# Patient Record
Sex: Female | Born: 1985 | Race: Black or African American | Hispanic: No | Marital: Single | Smoking: Current every day smoker
Health system: Southern US, Community
[De-identification: ages and names within clinical notes are randomized; demographics above are authoritative.]

## PROBLEM LIST (undated history)

## (undated) ENCOUNTER — Inpatient Hospital Stay (HOSPITAL_COMMUNITY): Payer: Self-pay

## (undated) DIAGNOSIS — F29 Unspecified psychosis not due to a substance or known physiological condition: Secondary | ICD-10-CM

## (undated) DIAGNOSIS — F1910 Other psychoactive substance abuse, uncomplicated: Secondary | ICD-10-CM

## (undated) DIAGNOSIS — D649 Anemia, unspecified: Secondary | ICD-10-CM

## (undated) NOTE — *Deleted (*Deleted)
Behavioral Health Admission H&P [facility] & OBS)  Date: 05/08/20 Patient Name: ANAYSHA ANDRE MRN: [id number] Chief Complaint:  Chief Complaint  Patient presents with  . Manic Behavior      Diagnoses:  Final diagnoses:  None    HPI: ***  PHQ 2-9:    ED from 03/26/2020 in [facility] Office Visit from 05/07/2017 in [facility]  Thoughts that you would be better off dead, or of hurting yourself in some way Several days Not at all  PHQ-9 Total Score 19 0        [facility]) [facility] from 03/26/2020 in [facility] Admission (Discharged) from [facility]  C-SSRS RISK CATEGORY No Risk No Risk No Risk       Total Time spent with patient: {Time; 15 min - 8 hours:17441}  Musculoskeletal  Strength & Muscle Tone: {desc; muscle tone:32375} Gait & Station: {PE GAIT ED WUJW:11914} Patient leans: {Patient Leans:21022755}  Psychiatric Specialty Exam  Presentation General Appearance: Bizarre;Disheveled  Eye Contact:Fleeting  Speech:Blocked  Speech Volume:Increased  Handedness:Right   Mood and Affect  Mood:Anxious;Irritable;Angry  Affect:Inappropriate;Full Range   Thought Process  Thought Processes:Disorganized  Descriptions of Associations:Loose  Orientation:Full (Time, Place and Person)  Thought Content:Illogical;Delusions;Paranoid Ideation  Hallucinations:Hallucinations: None  Ideas of Reference:None  Suicidal Thoughts:Suicidal Thoughts: No  Homicidal Thoughts:Homicidal Thoughts: No   Sensorium  Memory:Immediate Poor;Recent Poor;Remote Poor  Judgment:Poor  Insight:Lacking   Executive Functions  Concentration:Poor  Attention Span:Poor  Recall:Poor  Fund of Knowledge:Poor  Language:Poor   Psychomotor Activity  Psychomotor Activity:Psychomotor  Activity: Restlessness   Assets  Assets:Communication Skills;Desire for Improvement;Resilience;Social Support   Sleep  Sleep:Sleep: Fair   Physical Exam ROS  Blood pressure 113/84, pulse 93, temperature 98.6 F (37 C), temperature source Oral, resp. rate 18, SpO2 100 %. There is no height or weight on file to calculate BMI.  Past Psychiatric History: ***   Is the patient at risk to self? {YES/NO:21197} Has the patient been a risk to self in the past 6 months? {YES/NO:21197}.    Has the patient been a risk to self within the distant past? {YES/NO:21197}  Is the patient a risk to others? {YES/NO:21197}  Has the patient been a risk to others in the past 6 months? {YES/NO:21197}  Has the patient been a risk to others within the distant past? {YES/NO:21197}  Past Medical History:  Past Medical History:  Diagnosis Date  . Anemia   . Drug abuse (HCC)   . Psychosis (HCC) 2013    Past Surgical History:  Procedure Laterality Date  . CESAREAN SECTION  03/2016   pLTCS for twin B at [facility]: Lambert Rhonda W  . LAPAROSCOPY N/A 04/14/2017   Procedure: LAPAROSCOPY OPERATIVE WITH RIGHT SALPINGECTOMY;  Surgeon: Conan Bowens, MD;  Location: [facility];  Service: Gynecology;  Laterality: N/A;  . TUBAL LIGATION Bilateral 01/22/2018   Procedure: POST PARTUM TUBAL LIGATION;  Surgeon: [name], DO;  Location: [facility];  Service: Gynecology;  Laterality: Bilateral;    Family History:  Family History  Problem Relation Age of Onset  . Diabetes Mother   . Schizophrenia Mother   . Diabetes Brother   . Hypertension Maternal Aunt   . Healthy Father     Social History:  Social History   Socioeconomic History  . Marital status: Single    Spouse name: Not on file  .  Number of children: Not on file  . Years of education: Not on file  . Highest education level: Not on file  Occupational History  . Not on file  Tobacco Use  . Smoking status: Current Every Day Smoker    Packs/day: 0.25     Types: Cigarettes  . Smokeless tobacco: Never Used  Vaping Use  . Vaping Use: Never used  Substance and Sexual Activity  . Alcohol use: Not Currently    Alcohol/week: 0.0 standard drinks    Comment: socially  . Drug use: Not Currently    Types: Marijuana, Cocaine    Comment: Cocaine & Marijuana was used10/26/2018  . Sexual activity: Yes    Birth control/protection: None  Other Topics Concern  . Not on file  Social History Narrative  . Not on file   Social Determinants of Health   Financial Resource Strain:   . Difficulty of Paying Living Expenses: Not on file  Food Insecurity:   . Worried About Programme researcher, broadcasting/film/video in the Last Year: Not on file  . Ran Out of Food in the Last Year: Not on file  Transportation Needs:   . Lack of Transportation (Medical): Not on file  . Lack of Transportation (Non-Medical): Not on file  Physical Activity:   . Days of Exercise per Week: Not on file  . Minutes of Exercise per Session: Not on file  Stress:   . Feeling of Stress : Not on file  Social Connections:   . Frequency of Communication with Friends and Family: Not on file  . Frequency of Social Gatherings with Friends and Family: Not on file  . Attends Religious Services: Not on file  . Active Member of Clubs or Organizations: Not on file  . Attends Banker Meetings: Not on file  . Marital Status: Not on file  Intimate Partner Violence:   . Fear of Current or Ex-Partner: Not on file  . Emotionally Abused: Not on file  . Physically Abused: Not on file  . Sexually Abused: Not on file    SDOH:  SDOH Screenings   Alcohol Screen: Low Risk   . Last Alcohol Screening Score (AUDIT): 0  Depression (PHQ2-9): Medium Risk  . PHQ-2 Score: 19  Financial Resource Strain:   . Difficulty of Paying Living Expenses: Not on file  Food Insecurity:   . Worried About Programme researcher, broadcasting/film/video in the Last Year: Not on file  . Ran Out of Food in the Last Year: Not on file  Housing:   .  Last Housing Risk Score: Not on file  Physical Activity:   . Days of Exercise per Week: Not on file  . Minutes of Exercise per Session: Not on file  Social Connections:   . Frequency of Communication with Friends and Family: Not on file  . Frequency of Social Gatherings with Friends and Family: Not on file  . Attends Religious Services: Not on file  . Active Member of Clubs or Organizations: Not on file  . Attends Banker Meetings: Not on file  . Marital Status: Not on file  Stress:   . Feeling of Stress : Not on file  Tobacco Use: High Risk  . Smoking Tobacco Use: Current Every Day Smoker  . Smokeless Tobacco Use: Never Used  Transportation Needs:   . Freight forwarder (Medical): Not on file  . Lack of Transportation (Non-Medical): Not on file    Last Labs:  Admission on 03/27/2020, Discharged on  03/29/2020  Component Date Value Ref Range Status  . Opiates 03/28/2020 NONE DETECTED  NONE DETECTED Final  . Cocaine 03/28/2020 POSITIVE* NONE DETECTED Final  . Benzodiazepines 03/28/2020 NONE DETECTED  NONE DETECTED Final  . Amphetamines 03/28/2020 NONE DETECTED  NONE DETECTED Final  . Tetrahydrocannabinol 03/28/2020 POSITIVE* NONE DETECTED Final  . Barbiturates 03/28/2020 NONE DETECTED  NONE DETECTED Final   Comment: (NOTE) DRUG SCREEN FOR MEDICAL PURPOSES ONLY.  IF CONFIRMATION IS NEEDED FOR ANY PURPOSE, NOTIFY LAB WITHIN 5 DAYS.  LOWEST DETECTABLE LIMITS FOR URINE DRUG SCREEN Drug Class                     Cutoff (ng/mL) Amphetamine and metabolites    1000 Barbiturate and metabolites    200 Benzodiazepine                 200 Tricyclics and metabolites     300 Opiates and metabolites        300 Cocaine and metabolites        300 THC                            50 Performed at [facility], [address] [place], [place] [address]   Admission on 03/26/2020, Discharged on 03/27/2020  Component Date Value Ref Range Status  . SARS  Coronavirus 2 by RT PCR 03/26/2020 NEGATIVE  NEGATIVE Final   Comment: (NOTE) SARS-CoV-2 target nucleic acids are NOT DETECTED.  The SARS-CoV-2 RNA is generally detectable in upper respiratoy specimens during the acute phase of infection. The lowest concentration of SARS-CoV-2 viral copies this assay can detect is 131 copies/mL. A negative result does not preclude SARS-Cov-2 infection and should not be used as the sole basis for treatment or other patient management decisions. A negative result may occur with  improper specimen collection/handling, submission of specimen other than nasopharyngeal swab, presence of viral mutation(s) within the areas targeted by this assay, and inadequate number of viral copies (<131 copies/mL). A negative result must be combined with clinical observations, patient history, and epidemiological information. The expected result is Negative.  Fact Sheet for Patients:  https://www.moore.com/  Fact Sheet for Healthcare Providers:  https://www.young.biz/  This test is no                          t yet approved or cleared by the [place] FDA and  has been authorized for detection and/or diagnosis of SARS-CoV-2 by FDA under an Emergency Use Authorization (EUA). This EUA will remain  in effect (meaning this test can be used) for the duration of the COVID-19 declaration under Section 564(b)(1) of the Act, 21 U.S.C. section 360bbb-3(b)(1), unless the authorization is terminated or revoked sooner.    . Influenza A by PCR 03/26/2020 NEGATIVE  NEGATIVE Final  . Influenza B by PCR 03/26/2020 NEGATIVE  NEGATIVE Final   Comment: (NOTE) The Xpert Xpress SARS-CoV-2/FLU/RSV assay is intended as an aid in  the diagnosis of influenza from Nasopharyngeal swab specimens and  should not be used as a sole basis for treatment. Nasal washings and  aspirates are unacceptable for Xpert Xpress SARS-CoV-2/FLU/RSV  testing.  Fact  Sheet for Patients: https://www.moore.com/  Fact Sheet for Healthcare Providers: https://www.young.biz/  This test is not yet approved or cleared by the [place] FDA and  has been authorized for detection and/or diagnosis of SARS-CoV-2 by  FDA  under an Emergency Use Authorization (EUA). This EUA will remain  in effect (meaning this test can be used) for the duration of the  Covid-19 declaration under Section 564(b)(1) of the Act, 21  U.S.C. section 360bbb-3(b)(1), unless the authorization is  terminated or revoked. Performed at [facility], [address] [place], [place] [address]   . SARS Coronavirus 2 Ag 03/26/2020 Negative  Negative Preliminary  . WBC 03/26/2020 7.4  4.0 - 10.5 K/uL Final  . RBC 03/26/2020 4.54  3.87 - 5.11 MIL/uL Final  . Hemoglobin 03/26/2020 10.7* 12.0 - 15.0 g/dL Final  . HCT 40/98/1191 34.2* 36 - 46 % Final  . MCV 03/26/2020 75.3* 80.0 - 100.0 fL Final  . MCH 03/26/2020 23.6* 26.0 - 34.0 pg Final  . MCHC 03/26/2020 31.3  30.0 - 36.0 g/dL Final  . RDW 47/82/9562 15.2  11.5 - 15.5 % Final  . Platelets 03/26/2020 313  150 - 400 K/uL Final  . nRBC 03/26/2020 0.0  0.0 - 0.2 % Final  . Neutrophils Relative % 03/26/2020 44  % Final  . Neutro Abs 03/26/2020 3.3  1.7 - 7.7 K/uL Final  . Lymphocytes Relative 03/26/2020 43  % Final  . Lymphs Abs 03/26/2020 3.2  0.7 - 4.0 K/uL Final  . Monocytes Relative 03/26/2020 8  % Final  . Monocytes Absolute 03/26/2020 0.6  0.1 - 1.0 K/uL Final  . Eosinophils Relative 03/26/2020 4  % Final  . Eosinophils Absolute 03/26/2020 0.3  0.0 - 0.5 K/uL Final  . Basophils Relative 03/26/2020 1  % Final  . Basophils Absolute 03/26/2020 0.0  0.0 - 0.1 K/uL Final  . Immature Granulocytes 03/26/2020 0  % Final  . Abs Immature Granulocytes 03/26/2020 0.02  0.00 - 0.07 K/uL Final   Performed at [facility], [address] [place], [place] [address]  . Sodium 03/26/2020 138  135 -  145 mmol/L Final  . Potassium 03/26/2020 3.2* 3.5 - 5.1 mmol/L Final  . Chloride 03/26/2020 106  98 - 111 mmol/L Final  . CO2 03/26/2020 22  22 - 32 mmol/L Final  . Glucose, Bld 03/26/2020 84  70 - 99 mg/dL Final   Glucose reference range applies only to samples taken after fasting for at least 8 hours.  . BUN 03/26/2020 8  6 - 20 mg/dL Final  . Creatinine, Ser 03/26/2020 0.87  0.44 - 1.00 mg/dL Final  . Calcium 57/84/6962 8.9  8.9 - 10.3 mg/dL Final  . Total Protein 03/26/2020 7.0  6.5 - 8.1 g/dL Final  . Albumin 95/28/4132 3.9  3.5 - 5.0 g/dL Final  . AST 44/06/270 16  15 - 41 U/L Final  . ALT 03/26/2020 14  0 - 44 U/L Final  . Alkaline Phosphatase 03/26/2020 38  38 - 126 U/L Final  . Total Bilirubin 03/26/2020 1.1  0.3 - 1.2 mg/dL Final  . GFR, Estimated 03/26/2020 >60  >60 mL/min Final  . Anion gap 03/26/2020 10  5 - 15 Final   Performed at [facility], [address] [place], [place] [address]  . Hgb A1c MFr Bld 03/26/2020 4.9  4.8 - 5.6 % Final   Comment: (NOTE) Pre diabetes:          5.7%-6.4%  Diabetes:              >6.4%  Glycemic control for   <7.0% adults with diabetes   . Mean Plasma Glucose 03/26/2020 93.93  mg/dL Final  Performed at [facility], [address] [place], [place] [address]  . Cholesterol 03/26/2020 172  0 - 200 mg/dL Final  . Triglycerides 03/26/2020 55  <150 mg/dL Final  . HDL 60/45/4098 67  >40 mg/dL Final  . Total CHOL/HDL Ratio 03/26/2020 2.6  RATIO Final  . VLDL 03/26/2020 11  0 - 40 mg/dL Final  . LDL Cholesterol 03/26/2020 94  0 - 99 mg/dL Final   Comment:        Total Cholesterol/HDL:CHD Risk Coronary Heart Disease Risk Table                     Men   Women  1/2 Average Risk   3.4   3.3  Average Risk       5.0   4.4  2 X Average Risk   9.6   7.1  3 X Average Risk  23.4   11.0        Use the calculated Patient Ratio above and the CHD Risk Table to determine the patient's CHD Risk.        ATP III CLASSIFICATION (LDL):   <100     mg/dL   Optimal  119-147  mg/dL   Near or Above                    Optimal  130-159  mg/dL   Borderline  829-562  mg/dL   High  >130     mg/dL   Very High Performed at [facility], [address], [place], [place] [address]   . TSH 03/26/2020 1.998  0.350 - 4.500 uIU/mL Final   Comment: Performed by a 3rd Generation assay with a functional sensitivity of <=0.01 uIU/mL. Performed at [facility], [address], [place], [place] [address]   . SARS Coronavirus 2 Ag 03/26/2020 NEGATIVE  NEGATIVE Final   Comment: (NOTE) SARS-CoV-2 antigen NOT DETECTED.   Negative results are presumptive.  Negative results do not preclude SARS-CoV-2 infection and should not be used as the sole basis for treatment or other patient management decisions, including infection  control decisions, particularly in the presence of clinical signs and  symptoms consistent with COVID-19, or in those who have been in contact with the virus.  Negative results must be combined with clinical observations, patient history, and epidemiological information. The expected result is Negative.  Fact Sheet for Patients: https://sanders-williams.net/  Fact Sheet for Healthcare Providers: https://martinez.com/   This test is not yet approved or cleared by the [place] FDA and  has been authorized for detection and/or diagnosis of SARS-CoV-2 by FDA under an Emergency Use Authorization (EUA).  This EUA will remain in effect (meaning this test can be used) for the duration of  the C                          OVID-19 declaration under Section 564(b)(1) of the Act, 21 U.S.C. section 360bbb-3(b)(1), unless the authorization is terminated or revoked sooner.    . Preg Test, Ur 03/27/2020 NEGATIVE  NEGATIVE Final   Comment:        THE SENSITIVITY OF THIS METHODOLOGY IS >24 mIU/mL   . Glucose, UA 03/27/2020 NEGATIVE  NEGATIVE mg/dL Final  . Bilirubin Urine 03/27/2020  SMALL* NEGATIVE Final  . Ketones, ur 03/27/2020 15* NEGATIVE mg/dL Final  . Specific Gravity, Urine 03/27/2020 >=1.030  1.005 - 1.030 Final  . Hgb urine  dipstick 03/27/2020 LARGE* NEGATIVE Final  . pH 03/27/2020 6.5  5.0 - 8.0 Final  . Protein, ur 03/27/2020 100* NEGATIVE mg/dL Final  . Urobilinogen, UA 03/27/2020 2.0* 0.0 - 1.0 mg/dL Final  . Nitrite 16/03/9603 NEGATIVE  NEGATIVE Final  . Glori Luis 03/27/2020 NEGATIVE  NEGATIVE Final   Biochemical Testing Only. Please order routine urinalysis from main lab if confirmatory testing is needed.  Admission on 03/26/2020, Discharged on 03/26/2020  Component Date Value Ref Range Status  . Preg Test, Ur 03/26/2020 NEGATIVE  NEGATIVE Final   Comment:        THE SENSITIVITY OF THIS METHODOLOGY IS >24 mIU/mL   Admission on 01/23/2020, Discharged on 01/23/2020  Component Date Value Ref Range Status  . Color, UA 01/23/2020 yellow  yellow Final  . Clarity, UA 01/23/2020 clear  clear Final  . Glucose, UA 01/23/2020 negative  negative mg/dL Final  . Bilirubin, UA 01/23/2020 negative  negative Final  . Ketones, POC UA 01/23/2020 negative  negative mg/dL Final  . Spec Grav, UA 01/23/2020 >=1.030* 1.010 - 1.025 Final  . Blood, UA 01/23/2020 negative  negative Final  . pH, UA 01/23/2020 6.5  5.0 - 8.0 Final  . Protein Ur, POC 01/23/2020 negative  negative mg/dL Final  . Urobilinogen, UA 01/23/2020 1.0  0.2 or 1.0 E.U./dL Final  . Nitrite, UA 54/02/8118 Negative  Negative Final  . Leukocytes, UA 01/23/2020 Negative  Negative Final  . Preg Test, Ur 01/23/2020 Negative  Negative Final  . Trichomonas 01/23/2020 Negative   Final  . Chlamydia 01/23/2020 Negative   Final  . Neisseria Gonorrhea 01/23/2020 Negative   Final  . Comment 01/23/2020 Normal Reference Ranger Chlamydia - Negative   Final  . Comment 01/23/2020 Normal Reference Range Trichomonas - Negative   Final  . Comment 01/23/2020 Normal Reference Range Neisseria Gonorrhea - Negative   Final   Admission on 01/19/2020, Discharged on 01/19/2020  Component Date Value Ref Range Status  . Lipase 01/19/2020 30  11 - 51 U/L Final   Performed at [facility], [address] [place], [place] [address]  . Sodium 01/19/2020 137  135 - 145 mmol/L Final  . Potassium 01/19/2020 3.5  3.5 - 5.1 mmol/L Final  . Chloride 01/19/2020 108  98 - 111 mmol/L Final  . CO2 01/19/2020 22  22 - 32 mmol/L Final  . Glucose, Bld 01/19/2020 92  70 - 99 mg/dL Final   Glucose reference range applies only to samples taken after fasting for at least 8 hours.  . BUN 01/19/2020 8  6 - 20 mg/dL Final  . Creatinine, Ser 01/19/2020 0.78  0.44 - 1.00 mg/dL Final  . Calcium 95/62/1308 9.1  8.9 - 10.3 mg/dL Final  . Total Protein 01/19/2020 7.2  6.5 - 8.1 g/dL Final  . Albumin 65/78/4696 3.7  3.5 - 5.0 g/dL Final  . AST 29/52/8413 18  15 - 41 U/L Final  . ALT 01/19/2020 19  0 - 44 U/L Final  . Alkaline Phosphatase 01/19/2020 38  38 - 126 U/L Final  . Total Bilirubin 01/19/2020 1.2  0.3 - 1.2 mg/dL Final  . GFR calc non Af Amer 01/19/2020 >60  >60 mL/min Final  . GFR calc Af Amer 01/19/2020 >60  >60 mL/min Final  . Anion gap 01/19/2020 7  5 - 15 Final   Performed at [facility], [address] [place], [place] [address]  . WBC 01/19/2020 10.2  4.0 - 10.5 K/uL Final  .  RBC 01/19/2020 4.70  3.87 - 5.11 MIL/uL Final  . Hemoglobin 01/19/2020 10.8* 12.0 - 15.0 g/dL Final  . HCT 16/03/9603 34.9* 36 - 46 % Final  . MCV 01/19/2020 74.3* 80.0 - 100.0 fL Final  . MCH 01/19/2020 23.0* 26.0 - 34.0 pg Final  . MCHC 01/19/2020 30.9  30.0 - 36.0 g/dL Final  . RDW 54/02/8118 15.4  11.5 - 15.5 % Final  . Platelets 01/19/2020 419* 150 - 400 K/uL Final  . nRBC 01/19/2020 0.0  0.0 - 0.2 % Final   Performed at [facility], [address] [place], [place] [address]  . Color, Urine 01/19/2020 YELLOW  YELLOW Final  . APPearance 01/19/2020 CLEAR  CLEAR Final  . Specific Gravity, Urine 01/19/2020 1.016  1.005 -  1.030 Final  . pH 01/19/2020 5.0  5.0 - 8.0 Final  . Glucose, UA 01/19/2020 NEGATIVE  NEGATIVE mg/dL Final  . Hgb urine dipstick 01/19/2020 NEGATIVE  NEGATIVE Final  . Bilirubin Urine 01/19/2020 NEGATIVE  NEGATIVE Final  . Ketones, ur 01/19/2020 5* NEGATIVE mg/dL Final  . Protein, ur 95/62/1308 NEGATIVE  NEGATIVE mg/dL Final  . Nitrite 65/78/4696 NEGATIVE  NEGATIVE Final  . Glori Luis 01/19/2020 NEGATIVE  NEGATIVE Final   Performed at [facility], [address] [place], [place] [address]  . I-stat hCG, quantitative 01/19/2020 <5.0  <5 mIU/mL Final  . Comment 3 01/19/2020          Final   Comment:   GEST. AGE      CONC.  (mIU/mL)   <=1 WEEK        5 - 50     2 WEEKS       50 - 500     3 WEEKS       100 - 10,000     4 WEEKS     1,000 - 30,000        FEMALE AND NON-PREGNANT FEMALE:     LESS THAN 5 mIU/mL   Admission on 11/20/2019, Discharged on 11/20/2019  Component Date Value Ref Range Status  . Preg Test, Ur 11/20/2019 Negative  Negative Final  . Color, UA 11/20/2019 yellow  yellow Final  . Clarity, UA 11/20/2019 cloudy* clear Final  . Glucose, UA 11/20/2019 negative  negative mg/dL Final  . Bilirubin, UA 11/20/2019 negative  negative Final  . Ketones, POC UA 11/20/2019 negative  negative mg/dL Final  . Spec Grav, UA 11/20/2019 1.015  1.010 - 1.025 Final  . Blood, UA 11/20/2019 negative  negative Final  . pH, UA 11/20/2019 8.5* 5.0 - 8.0 Final  . Protein Ur, POC 11/20/2019 negative  negative mg/dL Final  . Urobilinogen, UA 11/20/2019 0.2  0.2 or 1.0 E.U./dL Final  . Nitrite, UA 41/32/4401 Negative  Negative Final  . Leukocytes, UA 11/20/2019 Large (3+)* Negative Final  . Neisseria Gonorrhea 11/20/2019 Positive*  Final  . Chlamydia 11/20/2019 Negative   Final  . Trichomonas 11/20/2019 Negative   Final  . Comment 11/20/2019 Normal Reference Range Trichomonas - Negative   Final  . Comment 11/20/2019 Normal Reference Ranger Chlamydia - Negative   Final  . Comment  11/20/2019 Normal Reference Range Neisseria Gonorrhea - Negative   Final  . Specimen Description 11/20/2019 URINE, RANDOM   Final  . Special Requests 11/20/2019 NONE   Final  . Culture 11/20/2019    Final                   Value:NO GROWTH Performed  at [facility], [address] [place], [place] [address]   . Report Status 11/20/2019 11/22/2019 FINAL   Final    Allergies: Naproxen  PTA Medications: (Not in a hospital admission)   Medical Decision Making  ***    Recommendations  [facility] MSE Recommendations:[id number]}  Gillermo Murdoch, NP 05/08/20  2:23 AM

---
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
## 2006-01-31 ENCOUNTER — Ambulatory Visit (HOSPITAL_COMMUNITY): Admission: RE | Admit: 2006-01-31 | Discharge: 2006-01-31 | Payer: Self-pay | Admitting: Obstetrics and Gynecology

## 2006-06-02 ENCOUNTER — Inpatient Hospital Stay (HOSPITAL_COMMUNITY): Admission: AD | Admit: 2006-06-02 | Discharge: 2006-06-04 | Payer: Self-pay | Admitting: Gynecology

## 2006-06-02 ENCOUNTER — Ambulatory Visit: Payer: Self-pay | Admitting: Gynecology

## 2006-06-22 ENCOUNTER — Emergency Department (HOSPITAL_COMMUNITY): Admission: EM | Admit: 2006-06-22 | Discharge: 2006-06-22 | Payer: Self-pay | Admitting: Family Medicine

## 2006-06-24 ENCOUNTER — Emergency Department (HOSPITAL_COMMUNITY): Admission: EM | Admit: 2006-06-24 | Discharge: 2006-06-24 | Payer: Self-pay | Admitting: Emergency Medicine

## 2009-02-15 ENCOUNTER — Ambulatory Visit (HOSPITAL_COMMUNITY): Admission: RE | Admit: 2009-02-15 | Discharge: 2009-02-15 | Payer: Self-pay | Admitting: Obstetrics

## 2009-07-18 ENCOUNTER — Inpatient Hospital Stay (HOSPITAL_COMMUNITY): Admission: AD | Admit: 2009-07-18 | Discharge: 2009-07-21 | Payer: Self-pay | Admitting: Obstetrics

## 2009-07-18 ENCOUNTER — Ambulatory Visit: Payer: Self-pay | Admitting: Obstetrics and Gynecology

## 2010-06-19 NOTE — L&D Delivery Note (Signed)
Delivery Note At 8:01 AM a viable female was delivered via Vaginal, Spontaneous Delivery (Presentation: Right Occiput Anterior).  APGAR: 9, 10; weight 7 lb 3.2 oz (3265 g).   Placenta status: Intact, Spontaneous.  Cord: 3 vessels with the following complications: None.  Cord pH: NA  Anesthesia: Epidural  Episiotomy: None Lacerations: None Suture Repair: NA Est. Blood Loss (mL): 250  Mom to postpartum.  Baby to nursery-stable. Bottle Cancelled BTL.  OP circ  Dorathy Kinsman 04/08/2011, 8:39 AM

## 2010-09-04 LAB — CBC
HCT: 35.4 % — ABNORMAL LOW (ref 36.0–46.0)
MCV: 80.3 fL (ref 78.0–100.0)
RBC: 4.4 MIL/uL (ref 3.87–5.11)

## 2010-11-21 ENCOUNTER — Other Ambulatory Visit: Payer: Self-pay | Admitting: Family Medicine

## 2010-11-21 DIAGNOSIS — Z3689 Encounter for other specified antenatal screening: Secondary | ICD-10-CM

## 2010-11-21 LAB — ANTIBODY SCREEN: Antibody Screen: NEGATIVE

## 2010-11-21 LAB — HIV ANTIBODY (ROUTINE TESTING W REFLEX): HIV: NONREACTIVE

## 2010-11-21 LAB — ABO/RH: RH Type: POSITIVE

## 2010-11-21 LAB — HEPATITIS B SURFACE ANTIGEN: Hepatitis B Surface Ag: NEGATIVE

## 2010-12-02 ENCOUNTER — Ambulatory Visit (HOSPITAL_COMMUNITY)
Admission: RE | Admit: 2010-12-02 | Discharge: 2010-12-02 | Disposition: A | Discharge status: Home / Self Care | Payer: Medicaid Other | Source: Ambulatory Visit | Attending: Family Medicine | Admitting: Family Medicine

## 2010-12-02 ENCOUNTER — Encounter (HOSPITAL_COMMUNITY): Payer: Self-pay

## 2010-12-02 DIAGNOSIS — O99330 Smoking (tobacco) complicating pregnancy, unspecified trimester: Secondary | ICD-10-CM | POA: Insufficient documentation

## 2010-12-02 DIAGNOSIS — Z1389 Encounter for screening for other disorder: Secondary | ICD-10-CM | POA: Insufficient documentation

## 2010-12-02 DIAGNOSIS — Z363 Encounter for antenatal screening for malformations: Secondary | ICD-10-CM | POA: Insufficient documentation

## 2010-12-02 DIAGNOSIS — Z3689 Encounter for other specified antenatal screening: Secondary | ICD-10-CM

## 2010-12-02 DIAGNOSIS — O358XX0 Maternal care for other (suspected) fetal abnormality and damage, not applicable or unspecified: Secondary | ICD-10-CM | POA: Insufficient documentation

## 2011-04-08 ENCOUNTER — Encounter (HOSPITAL_COMMUNITY): Payer: Self-pay | Admitting: *Deleted

## 2011-04-08 ENCOUNTER — Encounter (HOSPITAL_COMMUNITY): Payer: Self-pay

## 2011-04-08 ENCOUNTER — Encounter (HOSPITAL_COMMUNITY): Payer: Self-pay | Admitting: Anesthesiology

## 2011-04-08 ENCOUNTER — Encounter (HOSPITAL_COMMUNITY)
Admission: AD | Disposition: A | Discharge status: Home / Self Care | Payer: Self-pay | Source: Ambulatory Visit | Attending: Obstetrics & Gynecology

## 2011-04-08 ENCOUNTER — Inpatient Hospital Stay (HOSPITAL_COMMUNITY)
Admission: AD | Admit: 2011-04-08 | Discharge: 2011-04-10 | DRG: 775 | Disposition: A | Discharge status: Home / Self Care | Payer: Medicaid Other | Source: Ambulatory Visit | Attending: Obstetrics & Gynecology | Admitting: Obstetrics & Gynecology

## 2011-04-08 ENCOUNTER — Inpatient Hospital Stay (HOSPITAL_COMMUNITY): Payer: Medicaid Other | Admitting: Anesthesiology

## 2011-04-08 DIAGNOSIS — O4290 Premature rupture of membranes, unspecified as to length of time between rupture and onset of labor, unspecified weeks of gestation: Secondary | ICD-10-CM

## 2011-04-08 LAB — CBC
Hemoglobin: 10.7 g/dL — ABNORMAL LOW (ref 12.0–15.0)
MCH: 24.4 pg — ABNORMAL LOW (ref 26.0–34.0)
RBC: 4.39 MIL/uL (ref 3.87–5.11)
WBC: 11 10*3/uL — ABNORMAL HIGH (ref 4.0–10.5)

## 2011-04-08 LAB — RPR: RPR Ser Ql: NONREACTIVE

## 2011-04-08 SURGERY — LIGATION, FALLOPIAN TUBE, POSTPARTUM
Anesthesia: Epidural | Laterality: Bilateral

## 2011-04-08 MED ORDER — SENNOSIDES-DOCUSATE SODIUM 8.6-50 MG PO TABS
2.0000 | ORAL_TABLET | Freq: Every day | ORAL | Status: DC
  Administered 2011-04-08 – 2011-04-09 (×2): 2 via ORAL

## 2011-04-08 MED ORDER — EPHEDRINE 5 MG/ML INJ
10.0000 mg | INTRAVENOUS | Status: DC | PRN
  Filled 2011-04-08 (×2): qty 4

## 2011-04-08 MED ORDER — CITRIC ACID-SODIUM CITRATE 334-500 MG/5ML PO SOLN: 30.0000 mL | ORAL | Status: DC | PRN

## 2011-04-08 MED ORDER — WITCH HAZEL-GLYCERIN EX PADS: 1.0000 "application " | MEDICATED_PAD | CUTANEOUS | Status: DC | PRN

## 2011-04-08 MED ORDER — DIPHENHYDRAMINE HCL 25 MG PO CAPS: 25.0000 mg | ORAL_CAPSULE | Freq: Four times a day (QID) | ORAL | Status: DC | PRN

## 2011-04-08 MED ORDER — ONDANSETRON HCL 4 MG PO TABS: 4.0000 mg | ORAL_TABLET | ORAL | Status: DC | PRN

## 2011-04-08 MED ORDER — PHENYLEPHRINE 40 MCG/ML (10ML) SYRINGE FOR IV PUSH (FOR BLOOD PRESSURE SUPPORT)
80.0000 ug | PREFILLED_SYRINGE | INTRAVENOUS | Status: DC | PRN
Start: 2011-04-08 — End: 2011-04-10
  Filled 2011-04-08: qty 5

## 2011-04-08 MED ORDER — NALBUPHINE SYRINGE 5 MG/0.5 ML
10.0000 mg | INJECTION | INTRAMUSCULAR | Status: DC | PRN
  Administered 2011-04-08: 10 mg via INTRAVENOUS
  Filled 2011-04-08: qty 1
  Filled 2011-04-08: qty 0.5

## 2011-04-08 MED ORDER — OXYCODONE-ACETAMINOPHEN 5-325 MG PO TABS: 2.0000 | ORAL_TABLET | ORAL | Status: DC | PRN

## 2011-04-08 MED ORDER — NALBUPHINE SYRINGE 5 MG/0.5 ML
INJECTION | INTRAMUSCULAR | Status: AC
  Filled 2011-04-08: qty 0.5

## 2011-04-08 MED ORDER — PRENATAL PLUS 27-1 MG PO TABS
1.0000 | ORAL_TABLET | Freq: Every day | ORAL | Status: DC
  Administered 2011-04-08 – 2011-04-10 (×3): 1 via ORAL
  Filled 2011-04-08 (×2): qty 1

## 2011-04-08 MED ORDER — TETANUS-DIPHTH-ACELL PERTUSSIS 5-2.5-18.5 LF-MCG/0.5 IM SUSP
0.5000 mL | Freq: Once | INTRAMUSCULAR | Status: AC
  Administered 2011-04-09: 0.5 mL via INTRAMUSCULAR
  Filled 2011-04-08: qty 0.5

## 2011-04-08 MED ORDER — LIDOCAINE HCL (PF) 1 % IJ SOLN
INTRAMUSCULAR | Status: AC
  Filled 2011-04-08: qty 30

## 2011-04-08 MED ORDER — FENTANYL 2.5 MCG/ML BUPIVACAINE 1/10 % EPIDURAL INFUSION (WH - ANES)
14.0000 mL/h | INTRAMUSCULAR | Status: DC
  Filled 2011-04-08: qty 60

## 2011-04-08 MED ORDER — DIPHENHYDRAMINE HCL 50 MG/ML IJ SOLN: 12.5000 mg | INTRAMUSCULAR | Status: DC | PRN

## 2011-04-08 MED ORDER — IBUPROFEN 600 MG PO TABS
600.0000 mg | ORAL_TABLET | Freq: Four times a day (QID) | ORAL | Status: DC
  Administered 2011-04-08 – 2011-04-10 (×8): 600 mg via ORAL
  Filled 2011-04-08 (×4): qty 1

## 2011-04-08 MED ORDER — LACTATED RINGERS IV SOLN
500.0000 mL | INTRAVENOUS | Status: DC | PRN
  Administered 2011-04-08: 500 mL via INTRAVENOUS

## 2011-04-08 MED ORDER — SIMETHICONE 80 MG PO CHEW: 80.0000 mg | CHEWABLE_TABLET | ORAL | Status: DC | PRN

## 2011-04-08 MED ORDER — NALBUPHINE SYRINGE 5 MG/0.5 ML
INJECTION | INTRAMUSCULAR | Status: AC
  Administered 2011-04-08: 10 mg via INTRAVENOUS
  Filled 2011-04-08: qty 0.5

## 2011-04-08 MED ORDER — ACETAMINOPHEN 325 MG PO TABS: 650.0000 mg | ORAL_TABLET | ORAL | Status: DC | PRN

## 2011-04-08 MED ORDER — LACTATED RINGERS IV SOLN
500.0000 mL | Freq: Once | INTRAVENOUS | Status: AC
  Administered 2011-04-08: 500 mL via INTRAVENOUS

## 2011-04-08 MED ORDER — LACTATED RINGERS IV SOLN
INTRAVENOUS | Status: DC
  Administered 2011-04-08 (×2): via INTRAVENOUS

## 2011-04-08 MED ORDER — MEASLES, MUMPS & RUBELLA VAC ~~LOC~~ INJ: 0.5000 mL | INJECTION | Freq: Once | SUBCUTANEOUS | Status: DC

## 2011-04-08 MED ORDER — OXYTOCIN 20 UNITS IN LACTATED RINGERS INFUSION - SIMPLE
125.0000 mL/h | Freq: Once | INTRAVENOUS | Status: AC
  Administered 2011-04-08: 125 mL/h via INTRAVENOUS

## 2011-04-08 MED ORDER — DIBUCAINE 1 % RE OINT: 1.0000 "application " | TOPICAL_OINTMENT | RECTAL | Status: DC | PRN

## 2011-04-08 MED ORDER — OXYTOCIN 20 UNITS IN LACTATED RINGERS INFUSION - SIMPLE
INTRAVENOUS | Status: AC
  Filled 2011-04-08: qty 1000

## 2011-04-08 MED ORDER — IBUPROFEN 600 MG PO TABS
600.0000 mg | ORAL_TABLET | Freq: Four times a day (QID) | ORAL | Status: DC | PRN
  Filled 2011-04-08 (×4): qty 1

## 2011-04-08 MED ORDER — ONDANSETRON HCL 4 MG/2ML IJ SOLN: 4.0000 mg | INTRAMUSCULAR | Status: DC | PRN

## 2011-04-08 MED ORDER — FENTANYL 2.5 MCG/ML BUPIVACAINE 1/10 % EPIDURAL INFUSION (WH - ANES)
INTRAMUSCULAR | Status: DC | PRN
  Administered 2011-04-08: 14 mL/h via EPIDURAL

## 2011-04-08 MED ORDER — FLEET ENEMA 7-19 GM/118ML RE ENEM: 1.0000 | ENEMA | RECTAL | Status: DC | PRN

## 2011-04-08 MED ORDER — LANOLIN HYDROUS EX OINT: TOPICAL_OINTMENT | CUTANEOUS | Status: DC | PRN

## 2011-04-08 MED ORDER — PHENYLEPHRINE 40 MCG/ML (10ML) SYRINGE FOR IV PUSH (FOR BLOOD PRESSURE SUPPORT)
80.0000 ug | PREFILLED_SYRINGE | INTRAVENOUS | Status: DC | PRN
  Filled 2011-04-08 (×2): qty 5

## 2011-04-08 MED ORDER — BENZOCAINE-MENTHOL 20-0.5 % EX AERO: 1.0000 "application " | INHALATION_SPRAY | CUTANEOUS | Status: DC | PRN

## 2011-04-08 MED ORDER — ZOLPIDEM TARTRATE 5 MG PO TABS: 5.0000 mg | ORAL_TABLET | Freq: Every evening | ORAL | Status: DC | PRN

## 2011-04-08 MED ORDER — HYDROXYZINE HCL 50 MG/ML IM SOLN
50.0000 mg | Freq: Four times a day (QID) | INTRAMUSCULAR | Status: DC | PRN
  Filled 2011-04-08: qty 1

## 2011-04-08 MED ORDER — OXYTOCIN BOLUS FROM INFUSION
500.0000 mL | Freq: Once | INTRAVENOUS | Status: DC
  Filled 2011-04-08: qty 500

## 2011-04-08 MED ORDER — ONDANSETRON HCL 4 MG/2ML IJ SOLN: 4.0000 mg | Freq: Four times a day (QID) | INTRAMUSCULAR | Status: DC | PRN

## 2011-04-08 MED ORDER — EPHEDRINE 5 MG/ML INJ
10.0000 mg | INTRAVENOUS | Status: DC | PRN
  Filled 2011-04-08: qty 4

## 2011-04-08 MED ORDER — OXYTOCIN 10 UNIT/ML IJ SOLN
INTRAMUSCULAR | Status: AC
  Filled 2011-04-08: qty 2

## 2011-04-08 MED ORDER — LIDOCAINE HCL 1.5 % IJ SOLN
INTRAMUSCULAR | Status: DC | PRN
  Administered 2011-04-08 (×2): 4 mL via EPIDURAL

## 2011-04-08 MED ORDER — LIDOCAINE HCL (PF) 1 % IJ SOLN
30.0000 mL | INTRAMUSCULAR | Status: DC | PRN
  Filled 2011-04-08: qty 30

## 2011-04-08 MED ORDER — OXYCODONE-ACETAMINOPHEN 5-325 MG PO TABS: 1.0000 | ORAL_TABLET | ORAL | Status: DC | PRN

## 2011-04-08 MED ORDER — HYDROXYZINE HCL 50 MG PO TABS
50.0000 mg | ORAL_TABLET | Freq: Four times a day (QID) | ORAL | Status: DC | PRN
  Filled 2011-04-08: qty 1

## 2011-04-08 NOTE — Progress Notes (Signed)
Patient is here for labor eval. She states that she "broke her water" at 345am. She c/o ctx q5-36m. Reports good fetal movement.

## 2011-04-08 NOTE — H&P (Signed)
Debra Sullivan is a 25 y.o. female at [redacted]w[redacted]d presenting for SROM and labor. Maternal Medical History:  Reason for admission: Reason for admission: rupture of membranes and contractions.  Contractions: Frequency: regular.   Duration is approximately 90 seconds.   Perceived severity is strong.    Fetal activity: Perceived fetal activity is normal.   Last perceived fetal movement was within the past hour.    Prenatal Complications - Diabetes: none.    OB History    Grav Para Term Preterm Abortions TAB SAB Ect Mult Living   3 2 2       2      History reviewed. No pertinent past medical history. History reviewed. No pertinent past surgical history. Family History: family history is not on file. Social History:  reports that she has never smoked. She does not have any smokeless tobacco history on file. She reports that she does not drink alcohol or use illicit drugs.  Review of Systems  Unable to perform ROS: acuity of condition    Dilation: 6 Effacement (%): 90 Station: -1 Exam by:: Ivonne Andrew, CNM There were no vitals taken for this visit. Maternal Exam:  Uterine Assessment: Contraction strength is firm.  Contraction duration is 90 seconds. Contraction frequency is regular.   Abdomen: Fundal height is S=D.   Fetal presentation: vertex  Introitus: Normal vulva. Amniotic fluid character: clear. Grossly ruptured  Pelvis: adequate for delivery.   Cervix: Cervix evaluated by digital exam.     Fetal Exam Fetal Monitor Review: Mode: ultrasound.   Baseline rate: 130.  Variability: moderate (6-25 bpm).   Pattern: accelerations present and no decelerations.    Fetal State Assessment: Category I - tracings are normal.    Ht 5\' 6"  (1.676 m)  Wt 79.379 kg (175 lb)  BMI 28.25 kg/m2 BP 98/55  Pulse 92  Temp(Src) 98.2 F (36.8 C) (Oral)  Resp 16  Ht 5\' 6"  (1.676 m)  Wt 79.379 kg (175 lb)  BMI 28.25 kg/m2  SpO2 97%  Breastfeeding? Unknown  Physical Exam    Constitutional: She is oriented to person, place, and time. She appears well-developed and well-nourished. She appears distressed (severely).  HENT:  Head: Normocephalic.  Cardiovascular: Normal rate.   Respiratory: Effort normal.  GI: Soft. There is no tenderness.  Genitourinary: Vagina normal and uterus normal.  Musculoskeletal: Normal range of motion.  Neurological: She is alert and oriented to person, place, and time.  Skin: Skin is warm and dry.    Prenatal labs: ABO, Rh:   Antibody:   Rubella:   RPR:    HBsAg:    HIV:    GBS:   Negative  1 hour GTT 78    Assessment/Plan: Assessment: 1. Labor: active 2. Fetal Wellbeing: Category 1   3. Pain Control: Requesting epidural 4. GBS: neg 5. 40.1 week IUP  Plan:  1. Admit to BS per consult with MD 2. Routine L&D orders 3. Analgesia/anesthesia PRN    Dorathy Kinsman 04/08/2011, 5:25 AM

## 2011-04-08 NOTE — Progress Notes (Signed)
Pt keeps taking fhr monitors off during UC's

## 2011-04-08 NOTE — Anesthesia Procedure Notes (Signed)
Epidural Patient location during procedure: OB Start time: 04/08/2011 6:27 AM  Staffing Anesthesiologist: England Greb A. Performed by: anesthesiologist   Preanesthetic Checklist Completed: patient identified, site marked, surgical consent, pre-op evaluation, timeout performed, IV checked, risks and benefits discussed and monitors and equipment checked  Epidural Patient position: sitting Prep: site prepped and draped and DuraPrep Patient monitoring: continuous pulse ox and blood pressure Approach: midline Injection technique: LOR air  Needle:  Needle type: Tuohy  Needle gauge: 17 G Needle length: 9 cm Needle insertion depth: 7 cm Catheter type: closed end flexible Catheter size: 19 Gauge Catheter at skin depth: 12 cm Test dose: negative and 1.5% lidocaine  Assessment Events: blood not aspirated, injection not painful, no injection resistance, negative IV test and no paresthesia  Additional Notes Patient is more comfortable after epidural dosed. Please see RN's note for documentation of vital signs and FHR which are stable.

## 2011-04-08 NOTE — Anesthesia Preprocedure Evaluation (Signed)
Anesthesia Evaluation  Name, MR# and DOB Patient awake  General Assessment Comment  Reviewed: Allergy & Precautions, H&P , Patient's Chart, lab work & pertinent test results  Airway Mallampati: III TM Distance: >3 FB Neck ROM: full    Dental No notable dental hx. (+) Teeth Intact   Pulmonary  clear to auscultation  Pulmonary exam normal       Cardiovascular regular Normal    Neuro/Psych Negative Neurological ROS  Negative Psych ROS   GI/Hepatic negative GI ROS Neg liver ROS    Endo/Other  Negative Endocrine ROS  Renal/GU negative Renal ROS  Genitourinary negative   Musculoskeletal   Abdominal   Peds  Hematology negative hematology ROS (+)   Anesthesia Other Findings   Reproductive/Obstetrics (+) Pregnancy                           Anesthesia Physical Anesthesia Plan  ASA: II  Anesthesia Plan: Epidural   Post-op Pain Management:    Induction:   Airway Management Planned:   Additional Equipment:   Intra-op Plan:   Post-operative Plan:   Informed Consent: I have reviewed the patients History and Physical, chart, labs and discussed the procedure including the risks, benefits and alternatives for the proposed anesthesia with the patient or authorized representative who has indicated his/her understanding and acceptance.     Plan Discussed with: Anesthesiologist  Anesthesia Plan Comments:         Anesthesia Quick Evaluation  

## 2011-04-09 NOTE — Progress Notes (Signed)
Post Partum Day 1 Subjective: no complaints, up ad lib, voiding and tolerating PO, small lochia, plans to bottlefeed, Implanon for contraception  Objective: Blood pressure 88/52, pulse 89, temperature 98.3 F (36.8 C), temperature source Oral, resp. rate 18, height 5\' 6"  (1.676 m), weight 79.379 kg (175 lb), SpO2 99.00%, unknown if currently breastfeeding.(pt is bottlefeeding)  Physical Exam:  General: alert, cooperative and no distress Lochia:normal flow Chest: CTAB Heart: RRR no m/r/g Abdomen: +BS, soft, nontender,  Uterine Fundus: firm DVT Evaluation: No evidence of DVT seen on physical exam. Extremities: no edema   Basename 04/08/11 0535  HGB 10.7*  HCT 33.5*    Assessment/Plan: Plan for discharge tomorrow   LOS: 1 day   Sullivan,Debra Palm 04/09/2011, 7:42 AM

## 2011-04-09 NOTE — Anesthesia Postprocedure Evaluation (Signed)
  Anesthesia Post-op Note  Patient: Debra Sullivan  Procedure(s) Performed: * No procedures listed *  Patient Location: Mother/Baby  Anesthesia Type: Epidural  Level of Consciousness: awake  Airway and Oxygen Therapy: no Problems   Post-op Pain: mild  Post-op Assessment: Post-op Vital signs reviewed  Post-op Vital Signs: stable  Complications: No apparent anesthesia complications

## 2011-04-09 NOTE — Progress Notes (Signed)

## 2011-04-10 NOTE — Discharge Summary (Signed)
Agree with above note.  Debra Sullivan H. 04/10/2011 2:37 PM

## 2011-04-10 NOTE — Discharge Summary (Signed)
Obstetric Discharge Summary Reason for Admission: onset of labor and rupture of membranes Prenatal Procedures: ultrasound Intrapartum Procedures: spontaneous vaginal delivery Postpartum Procedures: none Complications-Operative and Postpartum: none Hemoglobin  Date Value Range Status  04/08/2011 10.7* 12.0-15.0 (g/dL) Final     HCT  Date Value Range Status  04/08/2011 33.5* 36.0-46.0 (%) Final    Discharge Diagnoses: Term Pregnancy-delivered  Discharge Information: Date: 04/10/2011 Activity: pelvic rest Diet: routine Medications: None Condition: stable Instructions: refer to practice specific booklet Discharge to: home   Newborn Data: Live born female  Birth Weight: 7 lb 3.2 oz (3265 g) APGAR: 9, 10  Home with mother.  Mat Carne 04/10/2011, 7:56 AM

## 2011-04-10 NOTE — Progress Notes (Signed)
UR chart review completed.  

## 2011-04-10 NOTE — Progress Notes (Signed)
Post Partum Day 2 for NSVD  Subjective: no complaints, up ad lib, voiding, tolerating PO and + flatus  Objective: Temp:  [97.9 F (36.6 C)-98.2 F (36.8 C)] 98.1 F (36.7 C) (10/22 0640) Pulse Rate:  [71-91] 71  (10/22 0640) Resp:  [18] 18  (10/22 0640) BP: (95-106)/(59-66) 95/59 mmHg (10/22 0640)   Physical Exam:  General: alert, cooperative, appears stated age and no distress Lochia: appropriate Uterine Fundus: firm DVT Evaluation: No evidence of DVT seen on physical exam. 2+ DP pulses bilat   Basename 04/08/11 0535  HGB 10.7*  HCT 33.5*    Assessment/Plan: Discharge home and Contraception will be Nexplanon given at outpatient visit   LOS: 2 days   Mat Carne 04/10/2011, 7:43 AM

## 2011-04-11 NOTE — H&P (Signed)
Agree with above note.  Debra Sullivan H. 04/11/2011 4:24 PM 

## 2011-06-20 DIAGNOSIS — F29 Unspecified psychosis not due to a substance or known physiological condition: Secondary | ICD-10-CM

## 2011-06-20 HISTORY — DX: Unspecified psychosis not due to a substance or known physiological condition: F29

## 2011-09-04 ENCOUNTER — Encounter (HOSPITAL_COMMUNITY): Payer: Self-pay

## 2011-09-04 ENCOUNTER — Emergency Department (HOSPITAL_COMMUNITY)
Admission: EM | Admit: 2011-09-04 | Discharge: 2011-09-04 | Disposition: A | Discharge status: Other Acute Inpatient Hospital | Payer: Medicaid Other | Attending: Emergency Medicine | Admitting: Emergency Medicine

## 2011-09-04 DIAGNOSIS — F29 Unspecified psychosis not due to a substance or known physiological condition: Secondary | ICD-10-CM

## 2011-09-04 LAB — RAPID URINE DRUG SCREEN, HOSP PERFORMED
Barbiturates: NOT DETECTED
Benzodiazepines: POSITIVE — AB

## 2011-09-04 LAB — COMPREHENSIVE METABOLIC PANEL
ALT: 14 U/L (ref 0–35)
Alkaline Phosphatase: 55 U/L (ref 39–117)
CO2: 24 mEq/L (ref 19–32)
Chloride: 101 mEq/L (ref 96–112)
GFR calc Af Amer: >90 mL/min (ref >90)
GFR calc non Af Amer: >90 mL/min (ref >90)
Glucose, Bld: 79 mg/dL (ref 70–99)
Potassium: 3.3 mEq/L — ABNORMAL LOW (ref 3.5–5.1)
Sodium: 140 mEq/L (ref 135–145)
Total Protein: 8.6 g/dL — ABNORMAL HIGH (ref 6.0–8.3)

## 2011-09-04 LAB — CBC
Hemoglobin: 11.8 g/dL — ABNORMAL LOW (ref 12.0–15.0)
RBC: 4.85 MIL/uL (ref 3.87–5.11)
WBC: 7 10*3/uL (ref 4.0–10.5)

## 2011-09-04 LAB — POCT PREGNANCY, URINE: Preg Test, Ur: NEGATIVE

## 2011-09-04 MED ORDER — POTASSIUM CHLORIDE CRYS ER 20 MEQ PO TBCR
40.0000 meq | EXTENDED_RELEASE_TABLET | Freq: Once | ORAL | Status: AC
  Filled 2011-09-04: qty 2

## 2011-09-04 MED ORDER — POTASSIUM CHLORIDE 20 MEQ/15ML (10%) PO LIQD
40.0000 meq | Freq: Once | ORAL | Status: AC
  Administered 2011-09-04: 40 meq via ORAL
  Filled 2011-09-04: qty 30

## 2011-09-04 NOTE — ED Notes (Signed)
Labs and noted faxed to [facility] by elaine RN.  Pt medicated as requested.  Mary RN okayed pt to be transported back to [facility] via [employer]
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
## 2011-09-04 NOTE — ED Notes (Signed)
Records faxed to [facility]; spoke with [facility] & verbalized receipt.
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
## 2011-09-05 NOTE — ED Provider Notes (Signed)
Medical screening examination/treatment/procedure(s) were performed by non-physician practitioner and as supervising physician I was immediately available for consultation/collaboration. Devoria Albe, MD, FACEP   Ward Givens, MD 09/05/11 [phone]

## 2012-04-16 ENCOUNTER — Encounter (HOSPITAL_COMMUNITY): Payer: Self-pay | Admitting: Emergency Medicine

## 2012-04-16 ENCOUNTER — Emergency Department (HOSPITAL_COMMUNITY)
Admission: EM | Admit: 2012-04-16 | Discharge: 2012-04-16 | Disposition: A | Discharge status: Home / Self Care | Payer: Self-pay | Attending: Emergency Medicine | Admitting: Emergency Medicine

## 2012-04-16 DIAGNOSIS — F17200 Nicotine dependence, unspecified, uncomplicated: Secondary | ICD-10-CM | POA: Insufficient documentation

## 2012-04-16 DIAGNOSIS — R112 Nausea with vomiting, unspecified: Secondary | ICD-10-CM | POA: Insufficient documentation

## 2012-04-16 DIAGNOSIS — R197 Diarrhea, unspecified: Secondary | ICD-10-CM | POA: Insufficient documentation

## 2012-04-16 LAB — POCT I-STAT, CHEM 8
BUN: 10 mg/dL (ref 6–23)
Calcium, Ion: 1.2 mmol/L (ref 1.12–1.23)
Chloride: 108 mEq/L (ref 96–112)
Creatinine, Ser: 0.9 mg/dL (ref 0.50–1.10)
Glucose, Bld: 93 mg/dL (ref 70–99)

## 2012-04-16 LAB — URINE MICROSCOPIC-ADD ON

## 2012-04-16 LAB — URINALYSIS, ROUTINE W REFLEX MICROSCOPIC
Nitrite: NEGATIVE
Specific Gravity, Urine: 1.035 — ABNORMAL HIGH (ref 1.005–1.030)
Urobilinogen, UA: 1 mg/dL (ref 0.0–1.0)

## 2012-04-16 MED ORDER — ONDANSETRON HCL 4 MG/2ML IJ SOLN
4.0000 mg | Freq: Once | INTRAMUSCULAR | Status: AC
  Administered 2012-04-16: 4 mg via INTRAVENOUS

## 2012-04-16 MED ORDER — POTASSIUM CHLORIDE CRYS ER 20 MEQ PO TBCR
20.0000 meq | EXTENDED_RELEASE_TABLET | Freq: Once | ORAL | Status: AC
  Administered 2012-04-16: 20 meq via ORAL
  Filled 2012-04-16: qty 1

## 2012-04-16 MED ORDER — SODIUM CHLORIDE 0.9 % IV BOLUS (SEPSIS)
1000.0000 mL | Freq: Once | INTRAVENOUS | Status: AC
  Administered 2012-04-16: 1000 mL via INTRAVENOUS

## 2012-04-16 MED ORDER — PROMETHAZINE HCL 25 MG PO TABS: 25.0000 mg | ORAL_TABLET | Freq: Four times a day (QID) | ORAL | Status: DC | PRN

## 2012-04-16 MED ORDER — ONDANSETRON HCL 4 MG/2ML IJ SOLN
INTRAMUSCULAR | Status: AC
  Administered 2012-04-16: 4 mg via INTRAVENOUS
  Filled 2012-04-16: qty 2

## 2012-04-16 MED ORDER — CIPROFLOXACIN HCL 500 MG PO TABS: 500.0000 mg | ORAL_TABLET | Freq: Two times a day (BID) | ORAL | Status: DC

## 2012-04-16 NOTE — ED Notes (Signed)
Pt dc to home. Pt states understanding to dc instructions. Pt ambulatory to exit without difficulty. 

## 2012-04-16 NOTE — ED Notes (Signed)
Pt dc to home.  Pt states feeling much better now.  120 oz of po fluids tolerated prior to dc.

## 2012-04-16 NOTE — ED Provider Notes (Signed)
History     CSN: [id number]  Arrival date & time 04/16/12  0047   First MD Initiated Contact with Patient 04/16/12 0049      Chief Complaint  Patient presents with  . Nausea  . Emesis  . Diarrhea    (Consider location/radiation/quality/duration/timing/severity/associated sxs/prior treatment) HPI HX per PT. N/V/D since yesterday unable to hold anything down, no sick contacts at home, some associated mild ABD cramping intermittent with symptoms now resolved. No blood in emesis or stools. No f/C, no rash or recent travel. MOd in severity. No known agrevating or alleviating factors History reviewed. No pertinent past medical history.  History reviewed. No pertinent past surgical history.  History reviewed. No pertinent family history.  History  Substance Use Topics  . Smoking status: Current Some Day Smoker  . Smokeless tobacco: Not on file  . Alcohol Use: No    OB History    Grav Para Term Preterm Abortions TAB SAB Ect Mult Living   3 3 3       3       Review of Systems  Constitutional: Negative for fever and chills.  HENT: Negative for neck pain and neck stiffness.   Eyes: Negative for pain.  Respiratory: Negative for shortness of breath.   Cardiovascular: Negative for chest pain.  Gastrointestinal: Positive for nausea, vomiting and diarrhea. Negative for blood in stool and anal bleeding.  Genitourinary: Negative for dysuria.  Musculoskeletal: Negative for back pain.  Skin: Negative for rash.  Neurological: Negative for headaches.  All other systems reviewed and are negative.    Allergies  Review of patient's allergies indicates no known allergies.  Home Medications  No current outpatient prescriptions on file.  BP 99/71  Pulse 71  Temp 97.7 F (36.5 C) (Oral)  Resp 18  SpO2 99%  LMP 04/08/2012  Breastfeeding? No  Physical Exam  Constitutional: She is oriented to person, place, and time. She appears well-developed and well-nourished.  HENT:  Head:  Normocephalic and atraumatic.       Mild dry mm  Eyes: Conjunctivae normal and EOM are normal. Pupils are equal, round, and reactive to light.  Neck: Trachea normal. Neck supple. No thyromegaly present.  Cardiovascular: Normal rate, regular rhythm, S1 normal, S2 normal and normal pulses.     No systolic murmur is present   No diastolic murmur is present  Pulses:      Radial pulses are 2+ on the right side, and 2+ on the left side.  Pulmonary/Chest: Effort normal and breath sounds normal. She has no wheezes. She has no rhonchi. She has no rales. She exhibits no tenderness.  Abdominal: Soft. Normal appearance and bowel sounds are normal. She exhibits no mass. There is no tenderness. There is no rebound, no guarding, no CVA tenderness and negative Murphy's sign.  Musculoskeletal:       BLE:s Calves nontender, no cords or erythema, negative Homans sign  Neurological: She is alert and oriented to person, place, and time. She has normal strength. No cranial nerve deficit or sensory deficit. GCS eye subscore is 4. GCS verbal subscore is 5. GCS motor subscore is 6.  Skin: Skin is warm and dry. No rash noted. She is not diaphoretic.  Psychiatric: Her speech is normal.       Cooperative and appropriate    ED Course  Procedures (including critical care time)  Results for orders placed during the hospital encounter of 04/16/12  URINALYSIS, ROUTINE W REFLEX MICROSCOPIC      Component  Value Range   Color, Urine YELLOW  YELLOW   APPearance CLOUDY (*) CLEAR   Specific Gravity, Urine 1.035 (*) 1.005 - 1.030   pH 6.0  5.0 - 8.0   Glucose, UA NEGATIVE  NEGATIVE mg/dL   Hgb urine dipstick TRACE (*) NEGATIVE   Bilirubin Urine SMALL (*) NEGATIVE   Ketones, ur 40 (*) NEGATIVE mg/dL   Protein, ur NEGATIVE  NEGATIVE mg/dL   Urobilinogen, UA 1.0  0.0 - 1.0 mg/dL   Nitrite NEGATIVE  NEGATIVE   Leukocytes, UA SMALL (*) NEGATIVE  PREGNANCY, URINE      Component Value Range   Preg Test, Ur NEGATIVE   NEGATIVE  URINE MICROSCOPIC-ADD ON      Component Value Range   Squamous Epithelial / LPF MANY (*) RARE   WBC, UA 7-10  <3 WBC/hpf   RBC / HPF 3-6  <3 RBC/hpf   Bacteria, UA MANY (*) RARE   Crystals CA OXALATE CRYSTALS (*) NEGATIVE   Urine-Other MUCOUS PRESENT    POCT I-STAT, CHEM 8      Component Value Range   Sodium 143  135 - 145 mEq/L   Potassium 3.4 (*) 3.5 - 5.1 mEq/L   Chloride 108  96 - 112 mEq/L   BUN 10  6 - 23 mg/dL   Creatinine, Ser 4.09  0.50 - 1.10 mg/dL   Glucose, Bld 93  70 - 99 mg/dL   Calcium, Ion 8.11  1.12 - 1.23 mmol/L   TCO2 23  0 - 100 mmol/L   Hemoglobin 12.2  12.0 - 15.0 g/dL   HCT 91.4  78.2 - 95.6 %   IVFs. IV zofran. UA and labs.   After medications, feeling better, serial ABD exams unchanged  1. Nausea, vomiting, and diarrhea    2:08 AM feels significantly better and tolerating POs, wants to go home. PO potassium provided.   MDM   N/V/D no acute ABD. Improved with zofran and IVfs. Labs and UA reviewed. u Cx pending. [facility], agrees to strict return precautions for any worsening condition.         Debra Nielsen, MD 04/16/12 [phone]

## 2012-04-16 NOTE — ED Notes (Signed)
Pt given po fluids.  Pt tolerating well so far.  Will monitor.

## 2012-04-16 NOTE — ED Notes (Addendum)
Pt reports N/V/D and decreased PO intake x3 days. Emesis after po intake and severe headache

## 2012-04-18 LAB — URINE CULTURE

## 2012-04-19 NOTE — ED Notes (Signed)
+   Urine Patient treated with cipro-sensitive to same-chart appended per protocol MD. 

## 2013-03-20 ENCOUNTER — Inpatient Hospital Stay (HOSPITAL_COMMUNITY): Payer: Medicaid Other

## 2013-03-20 ENCOUNTER — Encounter (HOSPITAL_COMMUNITY): Payer: Self-pay | Admitting: *Deleted

## 2013-03-20 ENCOUNTER — Inpatient Hospital Stay (HOSPITAL_COMMUNITY)
Admission: AD | Admit: 2013-03-20 | Discharge: 2013-03-20 | Disposition: A | Discharge status: Home / Self Care | Payer: Medicaid Other | Source: Ambulatory Visit | Attending: Obstetrics & Gynecology | Admitting: Obstetrics & Gynecology

## 2013-03-20 DIAGNOSIS — O200 Threatened abortion: Secondary | ICD-10-CM

## 2013-03-20 DIAGNOSIS — O209 Hemorrhage in early pregnancy, unspecified: Secondary | ICD-10-CM | POA: Insufficient documentation

## 2013-03-20 DIAGNOSIS — O021 Missed abortion: Secondary | ICD-10-CM

## 2013-03-20 LAB — WET PREP, GENITAL: Clue Cells Wet Prep HPF POC: NONE SEEN

## 2013-03-20 LAB — URINALYSIS, ROUTINE W REFLEX MICROSCOPIC
Bilirubin Urine: NEGATIVE
Glucose, UA: NEGATIVE mg/dL
Ketones, ur: NEGATIVE mg/dL
pH: 5.5 (ref 5.0–8.0)

## 2013-03-20 LAB — CBC
Hemoglobin: 10.4 g/dL — ABNORMAL LOW (ref 12.0–15.0)
MCH: 24 pg — ABNORMAL LOW (ref 26.0–34.0)
MCV: 72.8 fL — ABNORMAL LOW (ref 78.0–100.0)
RBC: 4.34 MIL/uL (ref 3.87–5.11)
WBC: 6.5 10*3/uL (ref 4.0–10.5)

## 2013-03-20 LAB — URINE MICROSCOPIC-ADD ON

## 2013-03-20 LAB — HCG, QUANTITATIVE, PREGNANCY: hCG, Beta Chain, Quant, S: 1662 m[IU]/mL — ABNORMAL HIGH (ref <5)

## 2013-03-20 MED ORDER — METRONIDAZOLE 500 MG PO TABS
2000.0000 mg | ORAL_TABLET | Freq: Once | ORAL | Status: AC
  Administered 2013-03-20: 2000 mg via ORAL
  Filled 2013-03-20: qty 4

## 2013-03-20 MED ORDER — FLUCONAZOLE 150 MG PO TABS
150.0000 mg | ORAL_TABLET | Freq: Once | ORAL | Status: AC
  Administered 2013-03-20: 150 mg via ORAL
  Filled 2013-03-20: qty 1

## 2013-03-20 NOTE — MAU Note (Signed)
Patient states she has had a positive home pregnancy test. States she has been bleeding and passing clots.

## 2013-03-20 NOTE — MAU Provider Note (Signed)
History     CSN: [id number]  Arrival date and time: 03/20/13 1020   First Provider Initiated Contact with Patient 03/20/13 1054      Chief Complaint  Patient presents with  . Possible Pregnancy  . Vaginal Bleeding   HPI  Pt is G4P3003 @ [redacted]w[redacted]d who presents with spotting yesterday and heavier red with pea size clots.  Pt had IC last week without any bleeding or pain.  Pt also states she is having lower abdominal pain that started this morning.  Pt denies pain with urination , vaginal discharge, constipation or diarrhea.  Past Medical History  Diagnosis Date  . Medical history non-contributory     Past Surgical History  Procedure Laterality Date  . No past surgeries      History reviewed. No pertinent family history.  History  Substance Use Topics  . Smoking status: Current Some Day Smoker -- 0.25 packs/day    Types: Cigarettes  . Smokeless tobacco: Not on file  . Alcohol Use: No    Allergies: No Known Allergies  Prescriptions prior to admission  Medication Sig Dispense Refill  . ciprofloxacin (CIPRO) 500 MG tablet Take 1 tablet (500 mg total) by mouth 2 (two) times daily.  20 tablet  0  . promethazine (PHENERGAN) 25 MG tablet Take 1 tablet (25 mg total) by mouth every 6 (six) hours as needed for nausea.  30 tablet  0    Review of Systems  Gastrointestinal: Positive for abdominal pain. Negative for nausea, vomiting, diarrhea and constipation.  Genitourinary: Negative for dysuria and urgency.   Physical Exam   Blood pressure 98/61, pulse 70, temperature 98 F (36.7 C), temperature source Oral, resp. rate 16, height 5' 5.5" (1.664 m), weight 69.4 kg (153 lb), last menstrual period 01/05/2013, SpO2 100.00%.  Physical Exam  Nursing note and vitals reviewed. Constitutional: She is oriented to person, place, and time. She appears well-developed and well-nourished. No distress.  HENT:  Head: Normocephalic.  Eyes: Pupils are equal, round, and reactive to light.   Neck: Normal range of motion. Neck supple.  Cardiovascular: Normal rate.   Respiratory: Effort normal.  GI: Soft. She exhibits no distension. There is no tenderness. There is no rebound and no guarding.  Genitourinary:  Small amount of blood tinged frothy disharge in vault; cervix parous, T; uterus enlarged?size NT; adnexa without palpable enlargement or tenderness  Musculoskeletal: Normal range of motion.  Neurological: She is alert and oriented to person, place, and time.  Skin: Skin is warm and dry.  Psychiatric: She has a normal mood and affect.    MAU Course  Procedures Results for orders placed during the hospital encounter of 03/20/13 (from the past 24 hour(s))  URINALYSIS, ROUTINE W REFLEX MICROSCOPIC     Status: Abnormal   Collection Time    03/20/13 10:30 AM      Result Value Range   Color, Urine YELLOW  YELLOW   APPearance HAZY (*) CLEAR   Specific Gravity, Urine 1.010  1.005 - 1.030   pH 5.5  5.0 - 8.0   Glucose, UA NEGATIVE  NEGATIVE mg/dL   Hgb urine dipstick LARGE (*) NEGATIVE   Bilirubin Urine NEGATIVE  NEGATIVE   Ketones, ur NEGATIVE  NEGATIVE mg/dL   Protein, ur NEGATIVE  NEGATIVE mg/dL   Urobilinogen, UA 0.2  0.0 - 1.0 mg/dL   Nitrite NEGATIVE  NEGATIVE   Leukocytes, UA MODERATE (*) NEGATIVE  URINE MICROSCOPIC-ADD ON     Status: Abnormal   Collection Time  03/20/13 10:30 AM      Result Value Range   WBC, UA 0-2  <3 WBC/hpf   RBC / HPF 3-6  <3 RBC/hpf   Bacteria, UA FEW (*) RARE   Urine-Other TRICHOMONAS PRESENT    POCT PREGNANCY, URINE     Status: Abnormal   Collection Time    03/20/13 10:37 AM      Result Value Range   Preg Test, Ur POSITIVE (*) NEGATIVE  CBC     Status: Abnormal   Collection Time    03/20/13 11:10 AM      Result Value Range   WBC 6.5  4.0 - 10.5 K/uL   RBC 4.34  3.87 - 5.11 MIL/uL   Hemoglobin 10.4 (*) 12.0 - 15.0 g/dL   HCT 16.1 (*) 09.6 - 04.5 %   MCV 72.8 (*) 78.0 - 100.0 fL   MCH 24.0 (*) 26.0 - 34.0 pg   MCHC 32.9   30.0 - 36.0 g/dL   RDW 40.9  81.1 - 91.4 %   Platelets 312  150 - 400 K/uL  WET PREP, GENITAL     Status: Abnormal   Collection Time    03/20/13 12:00 PM      Result Value Range   Yeast Wet Prep HPF POC FEW (*) NONE SEEN   Trich, Wet Prep MANY (*) NONE SEEN   Clue Cells Wet Prep HPF POC NONE SEEN  NONE SEEN   WBC, Wet Prep HPF POC MODERATE (*) NONE SEEN  discussed with Dr. Jolayne Panther-  Options discussed with pt and partner including expectant management, Cytotec and D&C Expectant management preferred at this point Informed partner that he needs to get treated for trich and [facility] Pt treated for Trich and yeast in MAU GC/Chlamydia pending Assessment and Plan  Missed ab- expectant management F/u in clinic 1 week Trich- treated in MAU  Debra Sullivan 03/20/2013, 10:54 AM

## 2013-03-21 ENCOUNTER — Encounter (HOSPITAL_COMMUNITY): Payer: Self-pay

## 2013-03-21 ENCOUNTER — Inpatient Hospital Stay (HOSPITAL_COMMUNITY)
Admission: AD | Admit: 2013-03-21 | Discharge: 2013-03-21 | Disposition: A | Discharge status: Home / Self Care | Payer: Medicaid Other | Source: Ambulatory Visit | Attending: Obstetrics and Gynecology | Admitting: Obstetrics and Gynecology

## 2013-03-21 DIAGNOSIS — R109 Unspecified abdominal pain: Secondary | ICD-10-CM | POA: Insufficient documentation

## 2013-03-21 DIAGNOSIS — O200 Threatened abortion: Secondary | ICD-10-CM | POA: Insufficient documentation

## 2013-03-21 MED ORDER — HYDROMORPHONE HCL PF 1 MG/ML IJ SOLN
INTRAMUSCULAR | Status: AC
  Administered 2013-03-21: 1 mg via INTRAMUSCULAR
  Filled 2013-03-21: qty 1

## 2013-03-21 MED ORDER — KETOROLAC TROMETHAMINE 60 MG/2ML IM SOLN
60.0000 mg | Freq: Once | INTRAMUSCULAR | Status: AC
  Administered 2013-03-21: 60 mg via INTRAMUSCULAR
  Filled 2013-03-21: qty 2

## 2013-03-21 MED ORDER — OXYCODONE-ACETAMINOPHEN 5-325 MG PO TABS: 1.0000 | ORAL_TABLET | ORAL | Status: DC | PRN

## 2013-03-21 MED ORDER — HYDROMORPHONE HCL PF 1 MG/ML IJ SOLN
1.0000 mg | Freq: Once | INTRAMUSCULAR | Status: AC
  Administered 2013-03-21: 1 mg via INTRAMUSCULAR

## 2013-03-21 MED ORDER — PROMETHAZINE HCL 25 MG PO TABS: 25.0000 mg | ORAL_TABLET | Freq: Four times a day (QID) | ORAL | Status: DC | PRN

## 2013-03-21 MED ORDER — PROMETHAZINE HCL 25 MG PO TABS
25.0000 mg | ORAL_TABLET | Freq: Once | ORAL | Status: AC
  Administered 2013-03-21: 25 mg via ORAL
  Filled 2013-03-21: qty 1

## 2013-03-21 NOTE — MAU Note (Signed)
Abdominal pain & vaginal bleeding worsened since 830 pm. Told she was going to have a miscarriage yesterday afternoon and sent home for expectant management.

## 2013-03-21 NOTE — MAU Provider Note (Signed)
  History     CSN: [id number]  Arrival date and time: 03/21/13 0226   First Provider Initiated Contact with Patient 03/21/13 0252      Chief Complaint  Patient presents with  . Miscarriage  . Abdominal Pain  . Vaginal Bleeding   HPI Comments: Debra Sullivan 27 y.o. [id number] presents to MAU for vaginal bleeding and threatened miscarriage. She was seen yesterday and following U/S at 10 weeks and 5 days with no heart beat, was told she was having a miscarriage. She was sent home with expectant management. Tonight the pain became unbearable and she called EMS.      Abdominal Pain Associated symptoms include nausea.  Vaginal Bleeding Associated symptoms include abdominal pain and nausea.      Past Medical History  Diagnosis Date  . Medical history non-contributory     Past Surgical History  Procedure Laterality Date  . No past surgeries      History reviewed. No pertinent family history.  History  Substance Use Topics  . Smoking status: Current Some Day Smoker -- 0.25 packs/day    Types: Cigarettes  . Smokeless tobacco: Not on file  . Alcohol Use: No    Allergies: No Known Allergies  No prescriptions prior to admission    Review of Systems  Constitutional: Negative.   HENT: Negative.   Eyes: Negative.   Respiratory: Negative.   Cardiovascular: Negative.   Gastrointestinal: Positive for nausea and abdominal pain.  Genitourinary: Negative.   Musculoskeletal: Negative.   Skin: Negative.   Neurological: Negative.   Psychiatric/Behavioral: Negative.    Physical Exam   Blood pressure 108/56, pulse 80, temperature 98.1 F (36.7 C), temperature source Oral, resp. rate 22, height 5' 5.5" (1.664 m), weight 69.4 kg (153 lb), last menstrual period 01/05/2013, SpO2 100.00%.  Physical Exam  Constitutional: She is oriented to person, place, and time. She appears well-developed and well-nourished. She appears distressed.  HENT:  Head: Normocephalic.  Eyes:  Pupils are equal, round, and reactive to light.  Neck: Normal range of motion.  GI: Soft. There is tenderness.  Genitourinary:  External: Bloody Vagina: dark red blood, no POC Cervix: 1cm dilated Biman: Uterus tender  Musculoskeletal: Normal range of motion.  Neurological: She is alert and oriented to person, place, and time.  Skin: Skin is warm and dry.  Psychiatric:  Appears In Pain    MAU Course  Procedures  MDM Dilaudid, Toradol. Phenergan  Assessment and Plan   A: Threatened Miscarriage  P:  Miscarriage precautions Percocet # 10 Note  For work   Carolynn Serve 03/21/2013, 3:23 AM

## 2013-03-25 NOTE — MAU Provider Note (Signed)
Attestation of Attending Supervision of Advanced Practitioner (CNM/NP): Evaluation and management procedures were performed by the Advanced Practitioner under my supervision and collaboration.  I have reviewed the Advanced Practitioner's note and chart, and I agree with the management and plan.  De Jaworski 03/25/2013 9:57 AM   

## 2013-03-27 ENCOUNTER — Encounter: Payer: Medicaid Other | Admitting: Obstetrics & Gynecology

## 2014-01-23 ENCOUNTER — Encounter (HOSPITAL_COMMUNITY): Payer: Self-pay | Admitting: *Deleted

## 2014-04-20 ENCOUNTER — Encounter (HOSPITAL_COMMUNITY): Payer: Self-pay | Admitting: *Deleted

## 2014-11-19 ENCOUNTER — Inpatient Hospital Stay (HOSPITAL_COMMUNITY)
Admission: AD | Admit: 2014-11-19 | Discharge: 2014-11-19 | Disposition: A | Discharge status: Home / Self Care | Payer: Medicaid Other | Source: Ambulatory Visit | Attending: Obstetrics & Gynecology | Admitting: Obstetrics & Gynecology

## 2014-11-19 ENCOUNTER — Encounter (HOSPITAL_COMMUNITY): Payer: Self-pay | Admitting: *Deleted

## 2014-11-19 DIAGNOSIS — N910 Primary amenorrhea: Secondary | ICD-10-CM | POA: Diagnosis not present

## 2014-11-19 DIAGNOSIS — R112 Nausea with vomiting, unspecified: Secondary | ICD-10-CM | POA: Diagnosis present

## 2014-11-19 DIAGNOSIS — F17210 Nicotine dependence, cigarettes, uncomplicated: Secondary | ICD-10-CM | POA: Insufficient documentation

## 2014-11-19 DIAGNOSIS — F1990 Other psychoactive substance use, unspecified, uncomplicated: Secondary | ICD-10-CM

## 2014-11-19 DIAGNOSIS — N926 Irregular menstruation, unspecified: Secondary | ICD-10-CM

## 2014-11-19 DIAGNOSIS — K529 Noninfective gastroenteritis and colitis, unspecified: Secondary | ICD-10-CM | POA: Diagnosis not present

## 2014-11-19 DIAGNOSIS — N760 Acute vaginitis: Secondary | ICD-10-CM | POA: Insufficient documentation

## 2014-11-19 DIAGNOSIS — B9689 Other specified bacterial agents as the cause of diseases classified elsewhere: Secondary | ICD-10-CM

## 2014-11-19 HISTORY — DX: Other psychoactive substance abuse, uncomplicated: F19.10

## 2014-11-19 HISTORY — DX: Unspecified psychosis not due to a substance or known physiological condition: F29

## 2014-11-19 LAB — URINALYSIS, ROUTINE W REFLEX MICROSCOPIC
GLUCOSE, UA: NEGATIVE mg/dL
Hgb urine dipstick: NEGATIVE
Ketones, ur: 15 mg/dL — AB
Nitrite: NEGATIVE
Protein, ur: NEGATIVE mg/dL
Specific Gravity, Urine: 1.025 (ref 1.005–1.030)
UROBILINOGEN UA: 2 mg/dL — AB (ref 0.0–1.0)
pH: 6.5 (ref 5.0–8.0)

## 2014-11-19 LAB — WET PREP, GENITAL
TRICH WET PREP: NONE SEEN
Yeast Wet Prep HPF POC: NONE SEEN

## 2014-11-19 LAB — CBC
HEMATOCRIT: 35.4 % — AB (ref 36.0–46.0)
Hemoglobin: 11.7 g/dL — ABNORMAL LOW (ref 12.0–15.0)
MCH: 24.3 pg — ABNORMAL LOW (ref 26.0–34.0)
MCHC: 33.1 g/dL (ref 30.0–36.0)
MCV: 73.4 fL — ABNORMAL LOW (ref 78.0–100.0)
Platelets: 366 10*3/uL (ref 150–400)
RBC: 4.82 MIL/uL (ref 3.87–5.11)
RDW: 14.8 % (ref 11.5–15.5)
WBC: 9.7 10*3/uL (ref 4.0–10.5)

## 2014-11-19 LAB — COMPREHENSIVE METABOLIC PANEL
ALBUMIN: 4 g/dL (ref 3.5–5.0)
ALT: 12 U/L — ABNORMAL LOW (ref 14–54)
AST: 19 U/L (ref 15–41)
Alkaline Phosphatase: 43 U/L (ref 38–126)
Anion gap: 4 — ABNORMAL LOW (ref 5–15)
BILIRUBIN TOTAL: 1.2 mg/dL (ref 0.3–1.2)
BUN: 7 mg/dL (ref 6–20)
CALCIUM: 9.2 mg/dL (ref 8.9–10.3)
CO2: 26 mmol/L (ref 22–32)
CREATININE: 0.96 mg/dL (ref 0.44–1.00)
Chloride: 108 mmol/L (ref 101–111)
GFR calc Af Amer: >60 mL/min (ref >60)
Glucose, Bld: 93 mg/dL (ref 65–99)
POTASSIUM: 3.5 mmol/L (ref 3.5–5.1)
SODIUM: 138 mmol/L (ref 135–145)
Total Protein: 7.8 g/dL (ref 6.5–8.1)

## 2014-11-19 LAB — RAPID URINE DRUG SCREEN, HOSP PERFORMED
Amphetamines: NOT DETECTED
BARBITURATES: NOT DETECTED
Benzodiazepines: NOT DETECTED
COCAINE: POSITIVE — AB
Opiates: NOT DETECTED
Tetrahydrocannabinol: POSITIVE — AB

## 2014-11-19 LAB — URINE MICROSCOPIC-ADD ON

## 2014-11-19 LAB — POCT PREGNANCY, URINE: PREG TEST UR: NEGATIVE

## 2014-11-19 MED ORDER — ONDANSETRON HCL 4 MG PO TABS: 4.0000 mg | ORAL_TABLET | Freq: Three times a day (TID) | ORAL | Status: DC | PRN

## 2014-11-19 MED ORDER — ONDANSETRON 8 MG PO TBDP
8.0000 mg | ORAL_TABLET | Freq: Once | ORAL | Status: AC
  Administered 2014-11-19: 8 mg via ORAL
  Filled 2014-11-19: qty 1

## 2014-11-19 MED ORDER — METRONIDAZOLE 500 MG PO TABS: 500.0000 mg | ORAL_TABLET | Freq: Two times a day (BID) | ORAL | Status: DC

## 2014-11-19 NOTE — Discharge Instructions (Signed)
If you do not get your period in 2 weeks take a home pregnancy test. If you continue to have menstrual irregularities see your gynecologist.  Bacterial Vaginosis Bacterial vaginosis is a vaginal infection that occurs when the normal balance of bacteria in the vagina is disrupted. It results from an overgrowth of certain bacteria. This is the most common vaginal infection in women of childbearing age. Treatment is important to prevent complications, especially in pregnant women, as it can cause a premature delivery. CAUSES  Bacterial vaginosis is caused by an increase in harmful bacteria that are normally present in smaller amounts in the vagina. Several different kinds of bacteria can cause bacterial vaginosis. However, the reason that the condition develops is not fully understood. RISK FACTORS Certain activities or behaviors can put you at an increased risk of developing bacterial vaginosis, including:  Having a new sex partner or multiple sex partners.  Douching.  Using an intrauterine device (IUD) for contraception. Women do not get bacterial vaginosis from toilet seats, bedding, swimming pools, or contact with objects around them. SIGNS AND SYMPTOMS  Some women with bacterial vaginosis have no signs or symptoms. Common symptoms include:  Grey vaginal discharge.  A fishlike odor with discharge, especially after sexual intercourse.  Itching or burning of the vagina and vulva.  Burning or pain with urination. DIAGNOSIS  Your health care provider will take a medical history and examine the vagina for signs of bacterial vaginosis. A sample of vaginal fluid may be taken. Your health care provider will look at this sample under a microscope to check for bacteria and abnormal cells. A vaginal pH test may also be done.  TREATMENT  Bacterial vaginosis may be treated with antibiotic medicines. These may be given in the form of a pill or a vaginal cream. A second round of antibiotics may be  prescribed if the condition comes back after treatment.  HOME CARE INSTRUCTIONS   Only take over-the-counter or prescription medicines as directed by your health care provider.  If antibiotic medicine was prescribed, take it as directed. Make sure you finish it even if you start to feel better.  Do not have sex until treatment is completed.  Tell all sexual partners that you have a vaginal infection. They should see their health care provider and be treated if they have problems, such as a mild rash or itching.  Practice safe sex by using condoms and only having one sex partner. SEEK MEDICAL CARE IF:   Your symptoms are not improving after 3 days of treatment.  You have increased discharge or pain.  You have a fever. MAKE SURE YOU:   Understand these instructions.  Will watch your condition.  Will get help right away if you are not doing well or get worse. FOR MORE INFORMATION  Centers for Disease Control and Prevention, Division of STD Prevention: SolutionApps.co.za American Sexual Health Association (ASHA): www.ashastd.org  Document Released: 06/05/2005 Document Revised: 03/26/2013 Document Reviewed: 01/15/2013 [facility] Patient Information 2015 [place], [place]. This information is not intended to replace advice given to you by your health care provider. Make sure you discuss any questions you have with your health care provider.  Stimulant Use Disorder-Cocaine Cocaine is one of a group of powerful drugs called stimulants. Cocaine has medical uses for stopping nosebleeds and for pain control before minor nose or dental surgery. However, cocaine is misused because of the effects that it produces. These effects include:   A feeling of extreme pleasure.  Alertness.  High energy. Common  street names for cocaine include coke, crack, blow, snow, and nose candy. Cocaine is snorted, dissolved in water and injected, or smoked.  Stimulants are addictive because they activate regions of  the brain that produce both the pleasurable sensation of "reward" and psychological dependence. Together, these actions account for loss of control and the rapid development of drug dependence. This means you become ill without the drug (withdrawal) and need to keep using it to function.  Stimulant use disorder is use of stimulants that disrupts your daily life. It disrupts relationships with family and friends and how you do your job. Cocaine increases your blood pressure and heart rate. It can cause a heart attack or stroke. Cocaine can also cause death from irregular heart rate or seizures. SYMPTOMS Symptoms of stimulant use disorder with cocaine include:  Use of cocaine in larger amounts or over a longer period of time than intended.  Unsuccessful attempts to cut down or control cocaine use.  A lot of time spent obtaining, using, or recovering from the effects of cocaine.  A strong desire or urge to use cocaine (craving).  Continued use of cocaine in spite of major problems at work, school, or home because of use.  Continued use of cocaine in spite of relationship problems because of use.  Giving up or cutting down on important life activities because of cocaine use.  Use of cocaine over and over in situations when it is physically hazardous, such as driving a car.  Continued use of cocaine in spite of a physical problem that is likely related to use. Physical problems can include:  Malnutrition.  Nosebleeds.  Chest pain.  High blood pressure.  A hole that develops between the part of your nose that separates your nostrils (perforated nasal septum).  Lung and kidney damage.  Continued use of cocaine in spite of a mental problem that is likely related to use. Mental problems can include:  Schizophrenia-like symptoms.  Depression.  Bipolar mood swings.  Anxiety.  Sleep problems.  Need to use more and more cocaine to get the same effect, or lessened effect over time  with use of the same amount of cocaine (tolerance).  Having withdrawal symptoms when cocaine use is stopped, or using cocaine to reduce or avoid withdrawal symptoms. Withdrawal symptoms include:  Depressed or irritable mood.  Low energy or restlessness.  Bad dreams.  Poor or excessive sleep.  Increased appetite. DIAGNOSIS Stimulant use disorder is diagnosed by your health care provider. You may be asked questions about your cocaine use and how it affects your life. A physical exam may be done. A drug screen may be ordered. You may be referred to a mental health professional. The diagnosis of stimulant use disorder requires at least two symptoms within 12 months. The type of stimulant use disorder depends on the number of signs and symptoms you have. The type may be:  Mild. Two or three signs and symptoms.  Moderate. Four or five signs and symptoms.  Severe. Six or more signs and symptoms. TREATMENT Treatment for stimulant use disorder is usually provided by mental health professionals with training in substance use disorders. The following options are available:  Counseling or talk therapy. Talk therapy addresses the reasons you use cocaine and ways to keep you from using again. Goals of talk therapy include:  Identifying and avoiding triggers for use.  Handling cravings.  Replacing use with healthy activities.  Support groups. Support groups provide emotional support, advice, and guidance.  Medicine. Certain medicines  may decrease cocaine cravings or withdrawal symptoms. HOME CARE INSTRUCTIONS  Take medicines only as directed by your health care provider.  Identify the people and activities that trigger your cocaine use and avoid them.  Keep all follow-up visits as directed by your health care provider. SEEK MEDICAL CARE IF:  Your symptoms get worse or you relapse.  You are not able to take medicines as directed. SEEK IMMEDIATE MEDICAL CARE IF:  You have serious  thoughts about hurting yourself or others.  You have a seizure, chest pain, sudden weakness, or loss of speech or vision. FOR MORE INFORMATION  National Institute on Drug Abuse: http://www.price-.com/  Substance Abuse and Mental Health Services Administration: SkateOasis.com.pt Document Released: 06/02/2000 Document Revised: 10/20/2013 Document Reviewed: 06/18/2013 [facility] Patient Information 2015 [place], [place]. This information is not intended to replace advice given to you by your health care provider. Make sure you discuss any questions you have with your health care provider.  [facility] Area Ob/Gyn Providers   Francoise Ceo      Phone: [phone]  [facility]     Phone: [phone]  Center for [facility] at [place]  Phone: [phone]  Center for [facility] at [place]  Phone: [phone]  [facility]    Phone: [phone]   [facility]    Phone: [phone]  [facility]    Phone: [phone]  [facility]    Phone: [phone]  [facility]    Phone: [phone]  [facility] Department-Family Planning Phone: [phone]   [facility] Health Department-Maternity  Phone: [phone]  [facility]    Phone: [phone]  [facility] [place]   Phone: [phone]  [facility]      Phone: [phone]  [facility]    Phone: [phone]  [facility]     Phone: [phone]
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
## 2014-11-19 NOTE — MAU Note (Signed)
C/o dizziness,nausea, vomiting and tiredness for past 3 days; afebrile;

## 2014-11-19 NOTE — MAU Note (Addendum)
Has been real nauseated, feels terrible this morning. Been nauseated and dizzy. (gait is steady, eating [employer] food in lobby).  Hx of irregular cycles, didn't reallize it was late.

## 2014-11-20 ENCOUNTER — Encounter (HOSPITAL_COMMUNITY): Payer: Self-pay | Admitting: *Deleted

## 2014-11-20 LAB — GC/CHLAMYDIA PROBE AMP (~~LOC~~) NOT AT ARMC
Chlamydia: POSITIVE — AB
Neisseria Gonorrhea: POSITIVE — AB

## 2014-12-04 ENCOUNTER — Inpatient Hospital Stay (HOSPITAL_COMMUNITY)
Admission: AD | Admit: 2014-12-04 | Discharge: 2014-12-04 | Discharge status: Against Medical Advice | Payer: Medicaid Other | Source: Ambulatory Visit | Attending: Obstetrics & Gynecology | Admitting: Obstetrics & Gynecology

## 2015-02-17 ENCOUNTER — Emergency Department (HOSPITAL_COMMUNITY): Payer: Medicaid Other

## 2015-02-17 ENCOUNTER — Encounter (HOSPITAL_COMMUNITY): Payer: Self-pay | Admitting: Emergency Medicine

## 2015-02-17 ENCOUNTER — Emergency Department (HOSPITAL_COMMUNITY)
Admission: EM | Admit: 2015-02-17 | Discharge: 2015-02-17 | Disposition: A | Discharge status: Home / Self Care | Payer: Medicaid Other | Attending: Emergency Medicine | Admitting: Emergency Medicine

## 2015-02-17 DIAGNOSIS — Z79899 Other long term (current) drug therapy: Secondary | ICD-10-CM | POA: Insufficient documentation

## 2015-02-17 DIAGNOSIS — Y998 Other external cause status: Secondary | ICD-10-CM | POA: Diagnosis not present

## 2015-02-17 DIAGNOSIS — Z3202 Encounter for pregnancy test, result negative: Secondary | ICD-10-CM | POA: Diagnosis not present

## 2015-02-17 DIAGNOSIS — T148 Other injury of unspecified body region: Secondary | ICD-10-CM | POA: Diagnosis not present

## 2015-02-17 DIAGNOSIS — F329 Major depressive disorder, single episode, unspecified: Secondary | ICD-10-CM | POA: Insufficient documentation

## 2015-02-17 DIAGNOSIS — R109 Unspecified abdominal pain: Secondary | ICD-10-CM

## 2015-02-17 DIAGNOSIS — Y9389 Activity, other specified: Secondary | ICD-10-CM | POA: Diagnosis not present

## 2015-02-17 DIAGNOSIS — T25021A Burn of unspecified degree of right foot, initial encounter: Secondary | ICD-10-CM | POA: Diagnosis not present

## 2015-02-17 DIAGNOSIS — Z720 Tobacco use: Secondary | ICD-10-CM | POA: Diagnosis not present

## 2015-02-17 DIAGNOSIS — S3991XA Unspecified injury of abdomen, initial encounter: Secondary | ICD-10-CM | POA: Insufficient documentation

## 2015-02-17 DIAGNOSIS — Y9289 Other specified places as the place of occurrence of the external cause: Secondary | ICD-10-CM | POA: Diagnosis not present

## 2015-02-17 LAB — CBC WITH DIFFERENTIAL/PLATELET
BASOS PCT: 0 % (ref 0–1)
Basophils Absolute: 0 10*3/uL (ref 0.0–0.1)
EOS ABS: 0.2 10*3/uL (ref 0.0–0.7)
Eosinophils Relative: 2 % (ref 0–5)
HCT: 35.1 % — ABNORMAL LOW (ref 36.0–46.0)
Hemoglobin: 11.6 g/dL — ABNORMAL LOW (ref 12.0–15.0)
LYMPHS ABS: 2.5 10*3/uL (ref 0.7–4.0)
Lymphocytes Relative: 29 % (ref 12–46)
MCH: 24.2 pg — AB (ref 26.0–34.0)
MCHC: 33 g/dL (ref 30.0–36.0)
MCV: 73.1 fL — ABNORMAL LOW (ref 78.0–100.0)
MONO ABS: 0.5 10*3/uL (ref 0.1–1.0)
Monocytes Relative: 6 % (ref 3–12)
NEUTROS PCT: 63 % (ref 43–77)
Neutro Abs: 5.3 10*3/uL (ref 1.7–7.7)
PLATELETS: 300 10*3/uL (ref 150–400)
RBC: 4.8 MIL/uL (ref 3.87–5.11)
RDW: 14.1 % (ref 11.5–15.5)
WBC: 8.5 10*3/uL (ref 4.0–10.5)

## 2015-02-17 LAB — BASIC METABOLIC PANEL
Anion gap: 7 (ref 5–15)
BUN: 6 mg/dL (ref 6–20)
CALCIUM: 9.2 mg/dL (ref 8.9–10.3)
CO2: 24 mmol/L (ref 22–32)
Chloride: 107 mmol/L (ref 101–111)
Creatinine, Ser: 0.87 mg/dL (ref 0.44–1.00)
GFR calc Af Amer: >60 mL/min (ref >60)
Glucose, Bld: 87 mg/dL (ref 65–99)
POTASSIUM: 3.5 mmol/L (ref 3.5–5.1)
SODIUM: 138 mmol/L (ref 135–145)

## 2015-02-17 LAB — POC URINE PREG, ED: PREG TEST UR: NEGATIVE

## 2015-02-17 MED ORDER — ULIPRISTAL ACETATE 30 MG PO TABS
30.0000 mg | ORAL_TABLET | Freq: Once | ORAL | Status: AC
  Administered 2015-02-17: 30 mg via ORAL
  Filled 2015-02-17: qty 1

## 2015-02-17 MED ORDER — CEFIXIME 400 MG PO TABS
400.0000 mg | ORAL_TABLET | Freq: Once | ORAL | Status: AC
  Administered 2015-02-17: 400 mg via ORAL
  Filled 2015-02-17: qty 1

## 2015-02-17 MED ORDER — AZITHROMYCIN 250 MG PO TABS
1000.0000 mg | ORAL_TABLET | Freq: Once | ORAL | Status: AC
  Administered 2015-02-17: 1000 mg via ORAL
  Filled 2015-02-17: qty 4

## 2015-02-17 MED ORDER — AZITHROMYCIN 1 G PO PACK
1.0000 g | PACK | Freq: Once | ORAL | Status: DC
  Filled 2015-02-17: qty 1

## 2015-02-17 MED ORDER — PROMETHAZINE HCL 25 MG PO TABS: 25.0000 mg | ORAL_TABLET | Freq: Four times a day (QID) | ORAL | Status: DC | PRN

## 2015-02-17 MED ORDER — IOHEXOL 300 MG/ML  SOLN
100.0000 mL | Freq: Once | INTRAMUSCULAR | Status: AC | PRN
  Administered 2015-02-17: 100 mL via INTRAVENOUS

## 2015-02-17 MED ORDER — METRONIDAZOLE 500 MG PO TABS
2000.0000 mg | ORAL_TABLET | Freq: Once | ORAL | Status: AC
  Administered 2015-02-17: 2000 mg via ORAL
  Filled 2015-02-17: qty 4

## 2015-02-17 MED ORDER — ONDANSETRON HCL 4 MG PO TABS: 4.0000 mg | ORAL_TABLET | Freq: Four times a day (QID) | ORAL | Status: DC

## 2015-02-17 NOTE — ED Notes (Signed)
Bed: WA15 Expected date:  Expected time:  Means of arrival:  Comments: Hold for triage 1 

## 2015-02-17 NOTE — ED Notes (Signed)
[employer] at bedside 
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
## 2015-02-17 NOTE — ED Notes (Signed)
Informed PA of patient's BP
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
## 2015-02-17 NOTE — Discharge Instructions (Signed)
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
## 2015-02-17 NOTE — ED Notes (Addendum)
Will collect vitals when pt. Returns from CT. Nurse was notified.
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
## 2015-02-19 NOTE — SANE Note (Signed)
Pt has a visitor. Pt says no one should know she is there. Spoke with registration and the [employer] officer at the desk. Visitor says she is an advocate but no one called her. Pt has a business card but is "sketchy". Desk officer spoke with responding officers who says that woman drove the pt there. Told officer that if pt consented to visit, it was ok. Visitor just happened to be an advocate.

## 2015-04-13 ENCOUNTER — Encounter (HOSPITAL_COMMUNITY): Payer: Self-pay | Admitting: *Deleted

## 2015-04-13 ENCOUNTER — Emergency Department (HOSPITAL_COMMUNITY)
Admission: EM | Admit: 2015-04-13 | Discharge: 2015-04-14 | Disposition: A | Discharge status: Other Acute Inpatient Hospital | Payer: Medicaid Other | Attending: Emergency Medicine | Admitting: Emergency Medicine

## 2015-04-13 DIAGNOSIS — R109 Unspecified abdominal pain: Secondary | ICD-10-CM | POA: Insufficient documentation

## 2015-04-13 DIAGNOSIS — G479 Sleep disorder, unspecified: Secondary | ICD-10-CM | POA: Insufficient documentation

## 2015-04-13 DIAGNOSIS — F22 Delusional disorders: Secondary | ICD-10-CM | POA: Insufficient documentation

## 2015-04-13 DIAGNOSIS — R630 Anorexia: Secondary | ICD-10-CM | POA: Insufficient documentation

## 2015-04-13 DIAGNOSIS — Z720 Tobacco use: Secondary | ICD-10-CM | POA: Insufficient documentation

## 2015-04-13 DIAGNOSIS — K5900 Constipation, unspecified: Secondary | ICD-10-CM | POA: Insufficient documentation

## 2015-04-13 DIAGNOSIS — F1210 Cannabis abuse, uncomplicated: Secondary | ICD-10-CM | POA: Insufficient documentation

## 2015-04-13 DIAGNOSIS — Z3202 Encounter for pregnancy test, result negative: Secondary | ICD-10-CM | POA: Insufficient documentation

## 2015-04-13 DIAGNOSIS — E860 Dehydration: Secondary | ICD-10-CM

## 2015-04-13 DIAGNOSIS — R110 Nausea: Secondary | ICD-10-CM | POA: Insufficient documentation

## 2015-04-13 LAB — COMPREHENSIVE METABOLIC PANEL
ALK PHOS: 34 U/L — AB (ref 38–126)
ALT: 11 U/L — ABNORMAL LOW (ref 14–54)
ANION GAP: 7 (ref 5–15)
AST: 16 U/L (ref 15–41)
Albumin: 3.9 g/dL (ref 3.5–5.0)
BILIRUBIN TOTAL: 1.5 mg/dL — AB (ref 0.3–1.2)
BUN: 11 mg/dL (ref 6–20)
CALCIUM: 9.2 mg/dL (ref 8.9–10.3)
CO2: 23 mmol/L (ref 22–32)
Chloride: 108 mmol/L (ref 101–111)
Creatinine, Ser: 1.06 mg/dL — ABNORMAL HIGH (ref 0.44–1.00)
GLUCOSE: 98 mg/dL (ref 65–99)
POTASSIUM: 3.9 mmol/L (ref 3.5–5.1)
SODIUM: 138 mmol/L (ref 135–145)
TOTAL PROTEIN: 7.1 g/dL (ref 6.5–8.1)

## 2015-04-13 LAB — CBC WITH DIFFERENTIAL/PLATELET
Basophils Absolute: 0 10*3/uL (ref 0.0–0.1)
Basophils Relative: 0 %
Eosinophils Absolute: 0.2 10*3/uL (ref 0.0–0.7)
Eosinophils Relative: 2 %
HCT: 34.6 % — ABNORMAL LOW (ref 36.0–46.0)
Hemoglobin: 11.3 g/dL — ABNORMAL LOW (ref 12.0–15.0)
Lymphocytes Relative: 35 %
Lymphs Abs: 2.6 10*3/uL (ref 0.7–4.0)
MCH: 23.8 pg — ABNORMAL LOW (ref 26.0–34.0)
MCHC: 32.7 g/dL (ref 30.0–36.0)
MCV: 73 fL — ABNORMAL LOW (ref 78.0–100.0)
Monocytes Absolute: 0.5 10*3/uL (ref 0.1–1.0)
Monocytes Relative: 7 %
Neutro Abs: 4.1 10*3/uL (ref 1.7–7.7)
Neutrophils Relative %: 56 %
Platelets: 301 10*3/uL (ref 150–400)
RBC: 4.74 MIL/uL (ref 3.87–5.11)
RDW: 14 % (ref 11.5–15.5)
WBC: 7.4 10*3/uL (ref 4.0–10.5)

## 2015-04-13 LAB — SALICYLATE LEVEL

## 2015-04-13 LAB — URINALYSIS, ROUTINE W REFLEX MICROSCOPIC
BILIRUBIN URINE: NEGATIVE
Glucose, UA: NEGATIVE mg/dL
HGB URINE DIPSTICK: NEGATIVE
KETONES UR: 15 mg/dL — AB
Leukocytes, UA: NEGATIVE
NITRITE: NEGATIVE
PROTEIN: NEGATIVE mg/dL
Specific Gravity, Urine: 1.034 — ABNORMAL HIGH (ref 1.005–1.030)
UROBILINOGEN UA: 1 mg/dL (ref 0.0–1.0)
pH: 6 (ref 5.0–8.0)

## 2015-04-13 LAB — ACETAMINOPHEN LEVEL: Acetaminophen (Tylenol), Serum: <10 ug/mL — ABNORMAL LOW (ref 10–30)

## 2015-04-13 LAB — POC URINE PREG, ED: PREG TEST UR: NEGATIVE

## 2015-04-13 LAB — ETHANOL: Alcohol, Ethyl (B): <5 mg/dL

## 2015-04-13 NOTE — ED Notes (Signed)
Patient states "I am living with Luanna Salkesmond and he keeps running to his Mom's and his dog is locked up in a room.  I am so tired haven't been able to sleep and I feel like my mind is in whirlwind".

## 2015-04-13 NOTE — ED Notes (Signed)
States she feels like there is someone after her

## 2015-04-14 ENCOUNTER — Emergency Department (HOSPITAL_COMMUNITY): Payer: Medicaid Other

## 2015-04-14 ENCOUNTER — Inpatient Hospital Stay (HOSPITAL_COMMUNITY)
Admission: AD | Admit: 2015-04-14 | Discharge: 2015-04-17 | DRG: 885 | Disposition: A | Discharge status: Home / Self Care | Payer: Medicaid Other | Source: Intra-hospital | Attending: Psychiatry | Admitting: Psychiatry

## 2015-04-14 ENCOUNTER — Encounter (HOSPITAL_COMMUNITY): Payer: Self-pay | Admitting: Family

## 2015-04-14 ENCOUNTER — Encounter (HOSPITAL_COMMUNITY): Payer: Self-pay | Admitting: Registered Nurse

## 2015-04-14 DIAGNOSIS — F1490 Cocaine use, unspecified, uncomplicated: Secondary | ICD-10-CM | POA: Diagnosis present

## 2015-04-14 DIAGNOSIS — F1220 Cannabis dependence, uncomplicated: Secondary | ICD-10-CM | POA: Diagnosis present

## 2015-04-14 DIAGNOSIS — F29 Unspecified psychosis not due to a substance or known physiological condition: Secondary | ICD-10-CM | POA: Diagnosis present

## 2015-04-14 DIAGNOSIS — F17210 Nicotine dependence, cigarettes, uncomplicated: Secondary | ICD-10-CM | POA: Diagnosis present

## 2015-04-14 DIAGNOSIS — F410 Panic disorder [episodic paroxysmal anxiety] without agoraphobia: Secondary | ICD-10-CM | POA: Diagnosis present

## 2015-04-14 DIAGNOSIS — F323 Major depressive disorder, single episode, severe with psychotic features: Secondary | ICD-10-CM | POA: Diagnosis present

## 2015-04-14 DIAGNOSIS — F1421 Cocaine dependence, in remission: Secondary | ICD-10-CM | POA: Diagnosis present

## 2015-04-14 DIAGNOSIS — F4310 Post-traumatic stress disorder, unspecified: Secondary | ICD-10-CM | POA: Diagnosis present

## 2015-04-14 LAB — RAPID URINE DRUG SCREEN, HOSP PERFORMED
Amphetamines: NOT DETECTED
BARBITURATES: NOT DETECTED
Benzodiazepines: NOT DETECTED
Cocaine: NOT DETECTED
Opiates: NOT DETECTED
Tetrahydrocannabinol: POSITIVE — AB

## 2015-04-14 LAB — LIPASE, BLOOD: LIPASE: 28 U/L (ref 11–51)

## 2015-04-14 MED ORDER — SODIUM CHLORIDE 0.9 % IV BOLUS (SEPSIS)
2000.0000 mL | Freq: Once | INTRAVENOUS | Status: AC
  Administered 2015-04-14: 2000 mL via INTRAVENOUS

## 2015-04-14 MED ORDER — ACETAMINOPHEN 325 MG PO TABS: 650.0000 mg | ORAL_TABLET | ORAL | Status: DC | PRN

## 2015-04-14 MED ORDER — ONDANSETRON HCL 4 MG PO TABS: 4.0000 mg | ORAL_TABLET | Freq: Three times a day (TID) | ORAL | Status: DC | PRN

## 2015-04-14 MED ORDER — ONDANSETRON HCL 4 MG/2ML IJ SOLN
4.0000 mg | Freq: Once | INTRAMUSCULAR | Status: AC
  Administered 2015-04-14: 4 mg via INTRAVENOUS
  Filled 2015-04-14: qty 2

## 2015-04-14 MED ORDER — ALUM & MAG HYDROXIDE-SIMETH 200-200-20 MG/5ML PO SUSP: 30.0000 mL | ORAL | Status: DC | PRN

## 2015-04-14 MED ORDER — MAGNESIUM HYDROXIDE 400 MG/5ML PO SUSP: 30.0000 mL | Freq: Every day | ORAL | Status: DC | PRN

## 2015-04-14 MED ORDER — TRAZODONE HCL 50 MG PO TABS
50.0000 mg | ORAL_TABLET | Freq: Every evening | ORAL | Status: DC | PRN
  Administered 2015-04-15: 50 mg via ORAL
  Filled 2015-04-14 (×9): qty 1

## 2015-04-14 MED ORDER — INFLUENZA VAC SPLIT QUAD 0.5 ML IM SUSY
0.5000 mL | PREFILLED_SYRINGE | INTRAMUSCULAR | Status: AC
  Administered 2015-04-15: 0.5 mL via INTRAMUSCULAR
  Filled 2015-04-14: qty 0.5

## 2015-04-14 NOTE — ED Notes (Signed)
TTS in room to speak with patient.  

## 2015-04-14 NOTE — Progress Notes (Signed)
D:Debra Sullivan is requesting to speak to a doctor at the start of shift. She is frustrated that she is still here after just signing a 72 hour notice this afternoon. I explained the process surrounding the 72 hour notice and she stated ok. She has no other questions or concerns at this time. She denies SI/HI/AVH. Rates anxiety 1/10 and depression 1/10. She states she does not need to be here and that a panic attack precipitated the events leading to her admission here.  A: Education of unit policies discussed. Encouragement and support given. R:Continue to monitor patient for safety and medication effectiveness.
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
## 2015-04-14 NOTE — ED Notes (Signed)
Patient was given a snack and drink. Regular diet order taken. 

## 2015-04-14 NOTE — Progress Notes (Signed)
Adult Psychoeducational Group Note  Date:  04/14/2015 Time:  8:46 PM  Group Topic/Focus:  Wrap-Up Group:   The focus of this group is to help patients review their daily goal of treatment and discuss progress on daily workbooks.  Participation Level:  Active  Participation Quality:  Appropriate  Affect:  Appropriate  Cognitive:  Appropriate  Insight: Appropriate  Engagement in Group:  Engaged  Modes of Intervention:  Discussion  Additional Comments:  [facility].The patient was informed our her schedule.  Octavio Mannshigpen, Soham Hollett Lee 04/14/2015, 8:46 PM

## 2015-04-14 NOTE — ED Provider Notes (Signed)
CSN: [id number]     Arrival date & time 04/13/15  2115 History  By signing my name below, I, Debra Sullivan, attest that this documentation has been prepared under the direction and in the presence of Loren Racer, MD. Electronically Signed: Lyndel Sullivan, ED Scribe. 04/14/2015. 12:50 AM.    Chief Complaint  Patient presents with  . Multiple complaints     . Altered Mental Status  . Medical Clearance   The history is provided by the patient. No language interpreter was used.   HPI Comments: Debra Sullivan is a 29 y.o. female, with a PMhx of illicit drug use and psychosis, who presents to the Emergency Department complaining of constant periumbilical abdominal pain with associated nausea but no vomiting onset this evening. She reports she has been unable to eat or drink regularly. Last normal BM was 3 days ago. She additionally states she is receiving strange text messages and experiencing the feeling that someone is 'after her'. The pt has not been taking her Seroquel for 4-5 months now and she reports taking no other medications. She has also been experiencing recent sleep disturbances and depression. She states 'I do not know who I am anymore'. Denies SI/HI. Pt also explains she lives with her child's father and states she sometimes feels like he doesn't want her there.   Past Medical History  Diagnosis Date  . Medical history non-contributory   . Psychosis 2013  . Drug abuse    Past Surgical History  Procedure Laterality Date  . No past surgeries     Family History  Problem Relation Age of Onset  . Diabetes Mother   . Diabetes Brother   . Hypertension Maternal Aunt    Social History  Substance Use Topics  . Smoking status: Current Some Day Smoker -- 0.25 packs/day    Types: Cigarettes  . Smokeless tobacco: None  . Alcohol Use: No   OB History    Gravida Para Term Preterm AB TAB SAB Ectopic Multiple Living   Review of Systems   Constitutional: Positive for appetite change. Negative for fever and fatigue.  Respiratory: Negative for shortness of breath.   Cardiovascular: Negative for chest pain.  Gastrointestinal: Positive for nausea, abdominal pain and constipation. Negative for vomiting and diarrhea.  Genitourinary: Negative for dysuria and flank pain.  Musculoskeletal: Negative for back pain and neck pain.  Skin: Negative for rash.  Neurological: Negative for dizziness, weakness, light-headedness, numbness and headaches.  Psychiatric/Behavioral: Positive for sleep disturbance and dysphoric mood. Negative for suicidal ideas and self-injury.  All other systems reviewed and are negative.  Allergies  Review of patient's allergies indicates no known allergies.  Home Medications   Prior to Admission medications   Not on File   BP 120/75 mmHg  Pulse 113  Temp(Src) 98 F (36.7 C) (Oral)  Resp 18  Ht  (1.651 m)  Wt 140 lb (63.504 kg)  BMI 23.30 kg/m2  SpO2 100%  LMP 02/11/2015 Physical Exam  Constitutional: She is oriented to person, place, and time. She appears well-developed and well-nourished. No distress.  HENT:  Head: Normocephalic and atraumatic.  Mouth/Throat: Oropharynx is clear and moist. No oropharyngeal exudate.  Eyes: EOM are normal. Pupils are equal, round, and reactive to light.  Neck: Normal range of motion. Neck supple.  Cardiovascular: Normal rate and regular rhythm.   Pulmonary/Chest: Effort normal and breath sounds normal. No respiratory distress. She  has no wheezes. She has no rales. She exhibits no tenderness.  Abdominal: Soft. Bowel sounds are normal. She exhibits distension. She exhibits no mass. There is no tenderness. There is no rebound and no guarding.  Abdomen is mildly distended. No focal tenderness. No rebound or guarding.  Musculoskeletal: Normal range of motion. She exhibits no edema or tenderness.  No CVA tenderness bilaterally.  Neurological: She is alert and  oriented to person, place, and time.  Moves all extremities without deficit. Sensation is intact.  Skin: Skin is warm and dry. No rash noted. No erythema.  Psychiatric: Her behavior is normal.  Mild paranoia. No SI/HI.  Nursing note and vitals reviewed.   ED Course  Procedures  DIAGNOSTIC STUDIES: Oxygen Saturation is 100% on RA, normal by my interpretation.    COORDINATION OF CARE: 12:41 AM Discussed treatment plan with pt at bedside and pt agreed to plan.  Labs Review Labs Reviewed  URINALYSIS, ROUTINE W REFLEX MICROSCOPIC (NOT AT [facility]) - Abnormal; Notable for the following:    APPearance CLOUDY (*)    Specific Gravity, Urine 1.034 (*)    Ketones, ur 15 (*)    All other components within normal limits  URINE RAPID DRUG SCREEN, HOSP PERFORMED - Abnormal; Notable for the following:    Tetrahydrocannabinol POSITIVE (*)    All other components within normal limits  CBC WITH DIFFERENTIAL/PLATELET - Abnormal; Notable for the following:    Hemoglobin 11.3 (*)    HCT 34.6 (*)    MCV 73.0 (*)    MCH 23.8 (*)    All other components within normal limits  COMPREHENSIVE METABOLIC PANEL - Abnormal; Notable for the following:    Creatinine, Ser 1.06 (*)    ALT 11 (*)    Alkaline Phosphatase 34 (*)    Total Bilirubin 1.5 (*)    All other components within normal limits  ACETAMINOPHEN LEVEL - Abnormal; Notable for the following:    Acetaminophen (Tylenol), Serum <10 (*)    All other components within normal limits  SALICYLATE LEVEL  ETHANOL  LIPASE, BLOOD  POC URINE PREG, ED    Imaging Review Dg Abd 1 View  04/14/2015  CLINICAL DATA:  Lower abdominal pain and nausea for 1 week. Low-grade fever. EXAM: ABDOMEN - 1 VIEW COMPARISON:  CT abdomen and pelvis February 17, 2015 FINDINGS: The bowel gas pattern is normal. No significant retained large bowel stool. No radio-opaque calculi or other significant radiographic abnormality are seen. Phleboliths in the pelvis. IMPRESSION: Normal bowel  gas pattern. Electronically Signed   By: Awilda Metroourtnay  Bloomer M.D.   On: 04/14/2015 01:23   I have personally reviewed and evaluated these images and lab results as part of my medical decision-making.   MDM   Final diagnoses:  Delusion (HCC)  Dehydration    I personally performed the services described in this documentation, which was scribed in my presence. The recorded information has been reviewed and is accurate.   Patient with vague abdominal symptoms without focal tenderness. Labs indicative of dehydration. Give IV fluids in the emergency department. Patient endorses dysphoric mood and sleep disturbance. Denies suicidal ideation. Currently on no psychiatric medications. TTS consult.  Evaluated by TTS. Recommend inpatient treatment. Psych hold orders placed. Will move patient to Pod C pending placement.  Loren Raceravid Jacklin Zwick, MD 04/14/15 [phone]

## 2015-04-14 NOTE — Progress Notes (Signed)
Pt presents on the unit, requesting towels to take a shower. Pt behavior cooperative, poor eye contact, paranoid affect. Pt denies SI/HI. Pt requesting to leave facility. "Everyone in here is crazy" Pt signed 72 hour request for discharge 04/14/2015 @1655 . Encouragement and support provided. Will continue to monitor on special checks q 15 mins for safety.

## 2015-04-14 NOTE — Progress Notes (Signed)
[facility] by Dr. Elna BreslowEappen. Can arrive 11am today. Admission is voluntary and report can be called at [phone].  Ilean SkillMeghan Derius Ghosh, MSW, LCSW Clinical Social Work, Disposition  04/14/2015 [phone]
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
## 2015-04-15 ENCOUNTER — Encounter (HOSPITAL_COMMUNITY): Payer: Self-pay | Admitting: Psychiatry

## 2015-04-15 DIAGNOSIS — F4310 Post-traumatic stress disorder, unspecified: Secondary | ICD-10-CM | POA: Clinically undetermined

## 2015-04-15 DIAGNOSIS — R45851 Suicidal ideations: Secondary | ICD-10-CM

## 2015-04-15 DIAGNOSIS — F1490 Cocaine use, unspecified, uncomplicated: Secondary | ICD-10-CM

## 2015-04-15 DIAGNOSIS — F323 Major depressive disorder, single episode, severe with psychotic features: Secondary | ICD-10-CM | POA: Diagnosis present

## 2015-04-15 DIAGNOSIS — F1421 Cocaine dependence, in remission: Secondary | ICD-10-CM | POA: Diagnosis present

## 2015-04-15 DIAGNOSIS — F1220 Cannabis dependence, uncomplicated: Secondary | ICD-10-CM | POA: Clinically undetermined

## 2015-04-15 MED ORDER — SERTRALINE HCL 25 MG PO TABS
25.0000 mg | ORAL_TABLET | Freq: Every day | ORAL | Status: DC
  Administered 2015-04-15 – 2015-04-16 (×2): 25 mg via ORAL
  Filled 2015-04-15 (×5): qty 1

## 2015-04-15 MED ORDER — OLANZAPINE 10 MG IM SOLR: 10.0000 mg | Freq: Three times a day (TID) | INTRAMUSCULAR | Status: DC | PRN

## 2015-04-15 MED ORDER — BENZTROPINE MESYLATE 0.5 MG PO TABS
0.5000 mg | ORAL_TABLET | Freq: Two times a day (BID) | ORAL | Status: DC
  Administered 2015-04-15 – 2015-04-17 (×5): 0.5 mg via ORAL
  Filled 2015-04-15 (×10): qty 1

## 2015-04-15 MED ORDER — OLANZAPINE 10 MG PO TBDP: 10.0000 mg | ORAL_TABLET | Freq: Three times a day (TID) | ORAL | Status: DC | PRN

## 2015-04-15 MED ORDER — HALOPERIDOL 2 MG PO TABS
2.5000 mg | ORAL_TABLET | Freq: Two times a day (BID) | ORAL | Status: DC
  Administered 2015-04-15 – 2015-04-16 (×3): 2.5 mg via ORAL
  Filled 2015-04-15 (×9): qty 1
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
## 2015-04-15 NOTE — [facility] Suicide Risk Assessment (Signed)
[facility]   Nursing information obtained from:    Demographic factors:    Current Mental Status:    Loss Factors:    Historical Factors:    Risk Reduction Factors:    Total Time spent with patient: 30 minutes Principal Problem: MDD (major depressive disorder), single episode, severe with psychosis (HCC) Diagnosis:   Patient Active Problem List   Diagnosis Date Noted  . MDD (major depressive disorder), single episode, severe with psychosis (HCC) [F32.3] 04/15/2015  . Cannabis use disorder, severe, dependence (HCC) [F12.20] 04/15/2015  . PTSD (post-traumatic stress disorder) [F43.10] 04/15/2015  . Psychosis [F29] 04/14/2015     Continued Clinical Symptoms:  Alcohol Use Disorder Identification Test Final Score (AUDIT): 0 The "Alcohol Use Disorders Identification Test", Guidelines for Use in Primary Care, Second Edition.  World Science writerHealth Organization [facility](WHO). Score between 0-7:  no or low risk or alcohol related problems. Score between 8-15:  moderate risk of alcohol related problems. Score between 16-19:  high risk of alcohol related problems. Score 20 or above:  warrants further diagnostic evaluation for alcohol dependence and treatment.   CLINICAL FACTORS:   Alcohol/Substance Abuse/Dependencies Unstable or Poor Therapeutic Relationship Previous Psychiatric Diagnoses and Treatments   Musculoskeletal: Strength & Muscle Tone: within normal limits Gait & Station: normal Patient leans: N/A  Psychiatric Specialty Exam: Physical Exam  ROS  Blood pressure 102/52, pulse 106, temperature 98.3 F (36.8 C), temperature source Oral, resp. rate 16, height 5\' 5"  (1.651 m), weight 64.411 kg (142 lb), last menstrual period 02/11/2015, unknown if currently breastfeeding.Body mass index is 23.63 kg/(m^2).                      Please see H&P.                                    COGNITIVE FEATURES THAT CONTRIBUTE TO RISK:   Closed-mindedness, Polarized thinking and Thought constriction (tunnel vision)    SUICIDE RISK:   Moderate:  Frequent suicidal ideation with limited intensity, and duration, some specificity in terms of plans, no associated intent, good self-control, limited dysphoria/symptomatology, some risk factors present, and identifiable protective factors, including available and accessible social support.  PLAN OF CARE: Please see H&P.   Medical Decision Making:  Review of Psycho-Social Stressors (1), Review or order clinical lab tests (1), Review and summation of old records (2), Review of Last Therapy Session (1), Review or order medicine tests (1), Review of Medication Regimen & Side Effects (2) and Review of New Medication or Change in Dosage (2)  I certify that inpatient services furnished can reasonably be expected to improve the patient's condition.   Bekah Igoe md 04/15/2015, 10:32 AM

## 2015-04-15 NOTE — B[facility] Group Notes (Signed)
[facility] Group Notes:  (Nursing/MHT/Case Management/Adjunct)  Date:  04/15/2015  Time: 0930  Type of Therapy:  Nurse Education  Participation Level:  Minimal  Participation Quality:  Inattentive  Affect:  Flat  Cognitive:  Lacking  Insight:  Lacking  Engagement in Group:  Lacking  Modes of Intervention:  Activity, Clarification, Confrontation, Socialization and Support  Summary of Progress/Problems: Pt left group early.    Dara Hoyershley N Romano Stigger 04/15/2015, 5:28 PM

## 2015-04-15 NOTE — Progress Notes (Signed)
Patient ID: Debra Sullivan, female   DOB: 1986-05-25, 29 y.o.   MRN: [id number] D: Patient in room resting on approach. Pt mood and affect appeared depressed and flat. Pt quite and forwarded little. Pt reports she is tolerating medication well. Pt denies SI/HI/AVH and pain. Pt reports goal is to "get well and stay on medication". Cooperative with assessment. No physical distress noted.  A: Met with pt 1:1. Medications administered as prescribed. Support and encouragement provided to attend groups and engage in milieu. Pt encouraged to discuss feelings and come to staff with any question or concerns.   R: Patient remains safe and complaint with medications.

## 2015-04-15 NOTE — B[facility] Suicide Risk Assessment (Signed)
[facility] INPATIENT:  Family/Significant Other Suicide Prevention Education  Suicide Prevention Education:  Education Completed; No one has been identified by the patient as the family member/significant other with whom the patient will be residing, and identified as the person(s) who will aid the patient in the event of a mental health crisis (suicidal ideations/suicide attempt).  With written consent from the patient, the family member/significant other has been provided the following suicide prevention education, prior to the and/or following the discharge of the patient.  The suicide prevention education provided includes the following:  Suicide risk factors  Suicide prevention and interventions  National Suicide Hotline telephone number  [facility] assessment telephone number  [facility] Emergency Assistance 911  [facility] and/or [facility]idential Mobile Crisis [facility] telephone number  Request made of family/significant other to:  Remove weapons (e.g., guns, rifles, knives), all items previously/currently identified as safety concern.    Remove drugs/medications (over-the-counter, prescriptions, illicit drugs), all items previously/currently identified as a safety concern.  The family member/significant other verbalizes understanding of the suicide prevention education information provided.  The family member/significant other agrees to remove the items of safety concern listed above.  Pt was not suicidal prior to discharge note. Pt also did not endorse SI while here. SPE not required.     Debra JordanLynn B Axl Sullivan 04/15/2015, 3:53 PM
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
## 2015-04-15 NOTE — Progress Notes (Addendum)
D:Per patient self inventory form pt reports she slept good last night with the use of sleep medication. She reports a good appetite, low energy level, good concentration. She rates depression 0/10, hopelessness 0/10, anxiety 0/10- all on 0-10 scale, 10 being the worse. Pt denies SI/HI. Pt denies physical problems. Pt reports her goal is "get well" and that she will "stay on track" to help meet her goal today. Pt denies AVH, but appears to be responding to internal stimuli. Poor eye contact noted during conversation, laughing inappropriately, bizarre behavior. Delusional. Forwards little on approach.  A:Special checks q 15 mins in place for safety. Medication administered per MD order(see eMAR) Encouragement and support provided.  R:Safety maintained. Compliant with medication regimen. Will continue to monitor.

## 2015-04-15 NOTE — B[facility] Group Notes (Signed)
B[facility] LCSW Group Therapy  04/15/2015 1:15 pm  Type of Therapy: Process Group Therapy  Participation Level:  Active  Participation Quality:  Appropriate  Affect:  Flat  Cognitive:  Oriented  Insight:  Improving  Engagement in Group:  Limited  Engagement in Therapy:  Limited  Modes of Intervention:  Activity, Clarification, Education, Problem-solving and Support  Summary of Progress/Problems: Today's group addressed the issue of overcoming obstacles.  Patients were asked to identify their biggest obstacle post d/c that stands in the way of their on-going success, and then problem solve as to how to manage this. Pt was alert and pleasant in group today. "My biggest obstacle is not being on meds.  I was taking meds when I had medicaid, but then it got cut off.  Then I couldn't afford my meds.  And I have a place to stay.  But it's with my boyfriend, and I would like to have my own place."   Daryel Geraldorth, Rodney B 04/15/2015   1:42 PM

## 2015-04-15 NOTE — Progress Notes (Signed)
As shift progressed pt presents with low profile. In bed. Will continue to monitor for safety.

## 2015-04-15 NOTE — Plan of Care (Signed)
Problem: Diagnosis: Increased Risk For Suicide Attempt Goal: STG-Patient Will Attend All Groups On The Unit Outcome: Not Progressing Pt has been her room resting most of the evening. Pt did not attend evening karaoke group.
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
## 2015-04-16 DIAGNOSIS — F323 Major depressive disorder, single episode, severe with psychotic features: Principal | ICD-10-CM

## 2015-04-16 LAB — IRON AND TIBC
Iron: 109 ug/dL (ref 28–170)
Saturation Ratios: 31 % (ref 10.4–31.8)
TIBC: 353 ug/dL (ref 250–450)
UIBC: 244 ug/dL

## 2015-04-16 LAB — TSH: TSH: 1.461 u[IU]/mL (ref 0.350–4.500)

## 2015-04-16 LAB — FERRITIN: Ferritin: 13 ng/mL (ref 11–307)

## 2015-04-16 MED ORDER — SERTRALINE HCL 50 MG PO TABS
50.0000 mg | ORAL_TABLET | Freq: Every day | ORAL | Status: DC
  Administered 2015-04-17: 50 mg via ORAL
  Filled 2015-04-16 (×3): qty 1

## 2015-04-16 MED ORDER — HALOPERIDOL 5 MG PO TABS
5.0000 mg | ORAL_TABLET | Freq: Every day | ORAL | Status: DC
  Filled 2015-04-16: qty 1

## 2015-04-16 NOTE — Progress Notes (Signed)
D Debra Sullivan remains quiet, guarded and sits quietly in the dayroom. She completed her daily assessment first thing this morning and on it she wrote she has had SI and she rated her depression, hopelessness and anxiety " 5/0/10", respectively.    A Zoloft is increased, haldol is increased and pt encouraged to get out of room and take part ingroups.   R Safety in place.

## 2015-04-16 NOTE — Progress Notes (Signed)
  [facility]  Will you be returning to the same living situation after discharge:  Yes,  living with her boyfriend  At discharge, do you have transportation home?: Yes,  boyfriend will transport. Do you have the ability to pay for your medications: Yes,  mental helath   Release of information consent forms completed and in the chart;  [name] needed at discharge.  Patient to Follow up at: [facility]   Patient denies SI/HI: Yes,  yes    Aeronautical engineerafety Planning and Suicide Prevention discussed: Yes,  yes  Have you used any form of tobacco in the last 30 days? (Cigarettes, Smokeless Tobacco, Cigars, and/or Pipes): Yes  Has patient been referred to the Quitline?: Yes, faxed on 04/16/15  Jonathon JordanLynn B Kenedi Cilia 04/16/2015, 2:56 PM
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
## 2015-04-17 DIAGNOSIS — F4310 Post-traumatic stress disorder, unspecified: Secondary | ICD-10-CM

## 2015-04-17 DIAGNOSIS — F1220 Cannabis dependence, uncomplicated: Secondary | ICD-10-CM

## 2015-04-17 LAB — HEMOGLOBIN A1C
Hgb A1c MFr Bld: 5.4 % (ref 4.8–5.6)
MEAN PLASMA GLUCOSE: 108 mg/dL

## 2015-04-17 LAB — PROLACTIN: Prolactin: 43.5 ng/mL — ABNORMAL HIGH (ref 4.8–23.3)

## 2015-04-17 LAB — HIV ANTIBODY (ROUTINE TESTING W REFLEX): HIV SCREEN 4TH GENERATION: NONREACTIVE

## 2015-04-17 LAB — RPR: RPR: NONREACTIVE

## 2015-04-17 LAB — HEPATITIS B SURFACE ANTIGEN: Hepatitis B Surface Ag: NEGATIVE

## 2015-04-17 LAB — HEPATITIS C ANTIBODY

## 2015-04-17 MED ORDER — BENZTROPINE MESYLATE 0.5 MG PO TABS
0.5000 mg | ORAL_TABLET | Freq: Two times a day (BID) | ORAL | Status: DC
  Filled 2015-04-17 (×3): qty 1

## 2015-04-17 MED ORDER — BENZTROPINE MESYLATE 0.5 MG PO TABS: 0.5000 mg | ORAL_TABLET | Freq: Two times a day (BID) | ORAL | Status: DC

## 2015-04-17 MED ORDER — SERTRALINE HCL 50 MG PO TABS
50.0000 mg | ORAL_TABLET | Freq: Every day | ORAL | Status: DC
  Filled 2015-04-17: qty 7

## 2015-04-17 MED ORDER — SERTRALINE HCL 50 MG PO TABS: 50.0000 mg | ORAL_TABLET | Freq: Every day | ORAL | Status: DC

## 2015-04-17 MED ORDER — HALOPERIDOL 5 MG PO TABS: 5.0000 mg | ORAL_TABLET | Freq: Every day | ORAL | Status: DC

## 2015-04-17 MED ORDER — HALOPERIDOL 5 MG PO TABS
5.0000 mg | ORAL_TABLET | Freq: Every day | ORAL | Status: DC
  Filled 2015-04-17 (×2): qty 7

## 2015-04-17 MED ORDER — TRAZODONE HCL 50 MG PO TABS
50.0000 mg | ORAL_TABLET | Freq: Every evening | ORAL | Status: DC | PRN
  Filled 2015-04-17 (×2): qty 14

## 2015-04-17 NOTE — Progress Notes (Signed)
D: Pt is isolative and withdrawn to room. Pt with flat affects denies any form of depression, anxiety, pain, SI, HI and AVH. She states, "I was off my medication before but am back on them now; I feel just fine; Pt does not appear to be listening to any internal stimuli. Pt was calm and cooperative through the shift assessments.  A: Medications offered as prescribed.  Support, encouragement, and safe environment provided.  15-minute safety checks continue.  R: Pt was did attend group. Safety checks continue. Pt did not need schedule sleep meds as she was already asleep.

## 2015-04-17 NOTE — [facility] Suicide Risk Assessment (Signed)
[facility]   Demographic Factors:  Female  Total Time spent with patient: 30 minutes  Musculoskeletal: Strength & Muscle Tone: within normal limits Gait & Station: normal Patient leans: N/A  Psychiatric Specialty Exam: Physical Exam  HENT:  Head: Normocephalic.  Skin: She is not diaphoretic.    Review of Systems  Constitutional: Negative for fever.  Cardiovascular: Negative for chest pain.  Skin: Negative for rash.  Psychiatric/Behavioral: Negative for suicidal ideas and hallucinations.    Blood pressure 92/62, pulse 115, temperature 97.6 F (36.4 C), temperature source Oral, resp. rate 16, height 5\' 5"  (1.651 m), weight 64.411 kg (142 lb), last menstrual period 02/11/2015, unknown if currently breastfeeding.Body mass index is 23.63 kg/(m^2).  General Appearance: Casual  Eye Contact::  Fair  Speech:  Normal Rate409  Volume:  Decreased  Mood:  Euthymic  Affect:  Constricted  Thought Process:  Coherent  Orientation:  Full (Time, Place, and Person)  Thought Content:  Rumination  Suicidal Thoughts:  No  Homicidal Thoughts:  No  Memory:  Immediate;   Fair Recent;   Fair  Judgement:  Fair  Insight:  Present  Psychomotor Activity:  Normal  Concentration:  Fair  Recall:  [employer]Fair  Fund of Knowledge:Fair  Language: Fair  Akathisia:  Negative  Handed:  Right  AIMS (if indicated):     Assets:  Desire for Improvement Financial Resources/Insurance Social Support  Sleep:  Number of Hours: 6.5  Cognition: WNL  ADL's:  Intact   Have you used any form of tobacco in the last 30 days? (Cigarettes, Smokeless Tobacco, Cigars, and/or Pipes): Yes  Has this patient used any form of tobacco in the last 30 days? (Cigarettes, Smokeless Tobacco, Cigars, and/or Pipes) Yes, A prescription for an FDA-approved tobacco cessation medication was offered at discharge and the patient refused  Mental Status Per Nursing Assessment::   On Admission:     Current Mental  Status by Physician: see above  Loss Factors: NA  Historical Factors: Impulsivity  Risk Reduction Factors:   Positive therapeutic relationship and Positive coping skills or problem solving skills  Continued Clinical Symptoms:  More than one psychiatric diagnosis Previous Psychiatric Diagnoses and Treatments  Cognitive Features That Contribute To Risk:  Closed-mindedness    Suicide Risk:  Minimal: No identifiable suicidal ideation.  Patients presenting with no risk factors but with morbid ruminations; may be classified as minimal risk based on the severity of the depressive symptoms  Principal Problem: MDD (major depressive disorder), single episode, severe with psychosis [facility](HCC) Discharge Diagnoses:  Patient Active Problem List   Diagnosis Date Noted  . MDD (major depressive disorder), single episode, severe with psychosis (HCC) [F32.3] 04/15/2015  . Cannabis use disorder, severe, dependence (HCC) [F12.20] 04/15/2015  . PTSD (post-traumatic stress disorder) [F43.10] 04/15/2015  . Cocaine use disorder, moderate, in early remission [F14.90] 04/15/2015  . Psychosis [F29] 04/14/2015    Follow-up Information    [facility]   Specialty:  Behavioral Health   Why:  Walk-in apt as soon as possible. Mon-Fri from 8am-3p. Please arrive as early as possible.   Contact information:   [address] [place] [address] [phone]       Plan Of Care/Follow-up recommendations:  Activity:  as tolerated Diet:  regular  Is patient on multiple antipsychotic therapies at discharge:  No   Has Patient had three or more failed trials of antipsychotic monotherapy by history:  No  Recommended Plan for Multiple Antipsychotic Therapies: NA  Ammaar Encina 04/17/2015, 10:32 AM

## 2015-04-17 NOTE — [facility] Group Notes (Signed)
[facility] Group Notes: (Clinical Social Work)   04/17/2015      Type of Therapy:  Group Therapy   Participation Level:  Did Not Attend    Ambrose MantleMareida Grossman-Orr, LCSW 04/17/2015, 2:57 PM
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
## 2015-04-17 NOTE — Progress Notes (Signed)
Patient is calm and cooperative during d/c process.  Reviewed all d/c instructions and medication information.  Denies SI or AVH.  Rx samples given with information.  All belongings returned.  Bus pass given and escorted to lobby in NAD.

## 2015-04-17 NOTE — Progress Notes (Signed)
  [facility] :  Will you be returning to the same living situation after discharge:  Yes,  home with boyfriend At discharge, do you have transportation home?: Yes,  boyfriend Do you have the ability to pay for your medications: Yes,  no issues  Release of information consent forms completed and in the chart;  [name] needed at discharge.  Patient to Follow up at: Follow-up Information    [facility]   Specialty:  Behavioral Health   Why:  Walk-in apt as soon as possible. Mon-Fri from 8am-3p. Please arrive as early as possible.   Contact information:   [address] [place] [address] [phone]       Patient denies SI/HI: Yes,  see doctor's SRA    Safety Planning and Suicide Prevention discussed: Yes,  completed  Have you used any form of tobacco in the last 30 days? (Cigarettes, Smokeless Tobacco, Cigars, and/or Pipes): Yes  Has patient been referred to the Quitline?: Yes, faxed on 04/14/15  Sarina SerGrossman-Orr, Vedika Dumlao Jo 04/17/2015, 3:01 PM

## 2015-04-19 LAB — GC/CHLAMYDIA PROBE AMP (~~LOC~~) NOT AT ARMC
CHLAMYDIA, DNA PROBE: POSITIVE — AB
NEISSERIA GONORRHEA: NEGATIVE

## 2015-04-21 ENCOUNTER — Telehealth (HOSPITAL_COMMUNITY): Payer: Self-pay

## 2015-04-21 NOTE — Progress Notes (Signed)
Reviewed patients test results in writer's in basket  - pt's tests were ordered by Dr.Cobos. Discussed this with Dr.Cobos . Dr.Cobos to review the test results.  Jomarie LongsSaramma Quinisha Mould ,MD Attending Psychiatrist  [facility]

## 2015-04-21 NOTE — Telephone Encounter (Signed)
Telephone call to phone number listed [phone] in an attempt to reach patient, but the lady who answered the phone reported no one lived there by this name and the phone number called was not someone else's phone number.  Unable to follow up with patient per request by Dr. Jama Flavorsobos on lab results from when she was inpatient and labs completed 04/16/15.

## 2015-05-07 ENCOUNTER — Emergency Department (HOSPITAL_COMMUNITY)
Admission: EM | Admit: 2015-05-07 | Discharge: 2015-05-07 | Disposition: A | Discharge status: Home / Self Care | Payer: Medicaid Other | Attending: Emergency Medicine | Admitting: Emergency Medicine

## 2015-05-07 ENCOUNTER — Encounter (HOSPITAL_COMMUNITY): Payer: Self-pay | Admitting: *Deleted

## 2015-05-07 DIAGNOSIS — R0600 Dyspnea, unspecified: Secondary | ICD-10-CM | POA: Diagnosis not present

## 2015-05-07 DIAGNOSIS — J45909 Unspecified asthma, uncomplicated: Secondary | ICD-10-CM | POA: Diagnosis not present

## 2015-05-07 DIAGNOSIS — F17210 Nicotine dependence, cigarettes, uncomplicated: Secondary | ICD-10-CM | POA: Insufficient documentation

## 2015-05-07 DIAGNOSIS — R0602 Shortness of breath: Secondary | ICD-10-CM | POA: Diagnosis present

## 2015-05-07 DIAGNOSIS — F410 Panic disorder [episodic paroxysmal anxiety] without agoraphobia: Secondary | ICD-10-CM | POA: Diagnosis not present

## 2015-05-07 DIAGNOSIS — R079 Chest pain, unspecified: Secondary | ICD-10-CM | POA: Insufficient documentation

## 2015-05-07 DIAGNOSIS — Z79899 Other long term (current) drug therapy: Secondary | ICD-10-CM | POA: Diagnosis not present

## 2015-05-07 NOTE — ED Notes (Addendum)
Pt states she is having asthma attack.  Has not used inhalers or taken breathing tx.  O2 sats 100%.  Pt states hx of panic attacks.

## 2015-05-07 NOTE — ED Notes (Signed)
Pt. Left without her instructions. Will leave them at the front desk.

## 2015-05-07 NOTE — Discharge Instructions (Signed)
Panic Attacks °Panic attacks are sudden, short-lived surges of severe anxiety, fear, or discomfort. They may occur for no reason when you are relaxed, when you are anxious, or when you are sleeping. Panic attacks may occur for a number of reasons:  °· Healthy people occasionally have panic attacks in extreme, life-threatening situations, such as war or natural disasters. Normal anxiety is a protective mechanism of the body that helps us react to danger (fight or flight response). °· Panic attacks are often seen with anxiety disorders, such as panic disorder, social anxiety disorder, generalized anxiety disorder, and phobias. Anxiety disorders cause excessive or uncontrollable anxiety. They may interfere with your relationships or other life activities. °· Panic attacks are sometimes seen with other mental illnesses, such as depression and posttraumatic stress disorder. °· Certain medical conditions, prescription medicines, and drugs of abuse can cause panic attacks. °SYMPTOMS  °Panic attacks start suddenly, peak within 20 minutes, and are accompanied by four or more of the following symptoms: °· Pounding heart or fast heart rate (palpitations). °· Sweating. °· Trembling or shaking. °· Shortness of breath or feeling smothered. °· Feeling choked. °· Chest pain or discomfort. °· Nausea or strange feeling in your stomach. °· Dizziness, light-headedness, or feeling like you will faint. °· Chills or hot flushes. °· Numbness or tingling in your lips or hands and feet. °· Feeling that things are not real or feeling that you are not yourself. °· Fear of losing control or going crazy. °· Fear of dying. °Some of these symptoms can mimic serious medical conditions. For example, you may think you are having a heart attack. Although panic attacks can be very scary, they are not life threatening. °DIAGNOSIS  °Panic attacks are diagnosed through an assessment by your health care provider. Your health care provider will ask  questions about your symptoms, such as where and when they occurred. Your health care provider will also ask about your medical history and use of alcohol and drugs, including prescription medicines. Your health care provider may order blood tests or other studies to rule out a serious medical condition. Your health care provider may refer you to a mental health professional for further evaluation. °TREATMENT  °· Most healthy people who have one or two panic attacks in an extreme, life-threatening situation will not require treatment. °· The treatment for panic attacks associated with anxiety disorders or other mental illness typically involves counseling with a mental health professional, medicine, or a combination of both. Your health care provider will help determine what treatment is best for you. °· Panic attacks due to physical illness usually go away with treatment of the illness. If prescription medicine is causing panic attacks, talk with your health care provider about stopping the medicine, decreasing the dose, or substituting another medicine. °· Panic attacks due to alcohol or drug abuse go away with abstinence. Some adults need professional help in order to stop drinking or using drugs. °HOME CARE INSTRUCTIONS  °· Take all medicines as directed by your health care provider.   °· Schedule and attend follow-up visits as directed by your health care provider. It is important to keep all your appointments. °SEEK MEDICAL CARE IF: °· You are not able to take your medicines as prescribed. °· Your symptoms do not improve or get worse. °SEEK IMMEDIATE MEDICAL CARE IF:  °· You experience panic attack symptoms that are different than your usual symptoms. °· You have serious thoughts about hurting yourself or others. °· You are taking medicine for panic attacks and   have a serious side effect. °MAKE SURE YOU: °· Understand these instructions. °· Will watch your condition. °· Will get help right away if you are not  doing well or get worse. °  °This information is not intended to replace advice given to you by your health care provider. Make sure you discuss any questions you have with your health care provider. °  °Document Released: 06/05/2005 Document Revised: 06/10/2013 Document Reviewed: 01/17/2013 °Elsevier Interactive Patient Education ©2016 [employer] ° °Shortness of Breath °Shortness of breath means you have trouble breathing. It could also mean that you have a medical problem. You should get immediate medical care for shortness of breath. °CAUSES  °· Not enough oxygen in the air such as with high altitudes or a smoke-filled room. °· Certain lung diseases, infections, or problems. °· Heart disease or conditions, such as angina or heart failure. °· Low red blood cells (anemia). °· Poor physical fitness, which can cause shortness of breath when you exercise. °· Chest or back injuries or stiffness. °· Being overweight. °· Smoking. °· Anxiety, which can make you feel like you are not getting enough air. °DIAGNOSIS  °Serious medical problems can often be found during your physical exam. Tests may also be done to determine why you are having shortness of breath. Tests may include: °· Chest X-rays. °· Lung function tests. °· Blood tests. °· An electrocardiogram (ECG). °· An ambulatory electrocardiogram. An ambulatory ECG records your heartbeat patterns over a 24-hour period. °· Exercise testing. °· A transthoracic echocardiogram (TTE). During echocardiography, sound waves are used to evaluate how blood flows through your heart. °· A transesophageal echocardiogram (TEE). °· Imaging scans. °Your health care provider may not be able to find a cause for your shortness of breath after your exam. In this case, it is important to have a follow-up exam with your health care provider as directed.  °TREATMENT  °Treatment for shortness of breath depends on the cause of your symptoms and can vary greatly. °HOME CARE INSTRUCTIONS   °· Do not smoke. Smoking is a common cause of shortness of breath. If you smoke, ask for help to quit. °· Avoid being around chemicals or things that may bother your breathing, such as paint fumes and dust. °· Rest as needed. Slowly resume your usual activities. °· If medicines were prescribed, take them as directed for the full length of time directed. This includes oxygen and any inhaled medicines. °· Keep all follow-up appointments as directed by your health care provider. °SEEK MEDICAL CARE IF:  °· Your condition does not improve in the time expected. °· You have a hard time doing your normal activities even with rest. °· You have any new symptoms. °SEEK IMMEDIATE MEDICAL CARE IF:  °· Your shortness of breath gets worse. °· You feel light-headed, faint, or develop a cough not controlled with medicines. °· You start coughing up blood. °· You have pain with breathing. °· You have chest pain or pain in your arms, shoulders, or abdomen. °· You have a fever. °· You are unable to walk up stairs or exercise the way you normally do. °MAKE SURE YOU: °· Understand these instructions. °· Will watch your condition. °· Will get help right away if you are not doing well or get worse. °  °This information is not intended to replace advice given to you by your health care provider. Make sure you discuss any questions you have with your health care provider. °  °Document Released: 02/28/2001 Document Revised: 06/10/2013   Document Reviewed: 08/21/2011 °Elsevier Interactive Patient Education ©2016 [employer]vier Inc. ° °

## 2015-05-07 NOTE — ED Provider Notes (Signed)
CSN: [id number]     Arrival date & time 05/07/15  1156 History   First MD Initiated Contact with Patient 05/07/15 1214     Chief Complaint  Patient presents with  . Shortness of Breath     (Consider location/radiation/quality/duration/timing/severity/associated sxs/prior Treatment) HPI  29 year old female presents with a recurrent panic attack. She states around 9 AM this morning she developed acute dry mouth, chest tightness, and trouble breathing. Feels like prior panic attacks except more severe. Last about an hour. Has been resolved since before she got here. Patient states that she does not have asthma but based on the source of breath she was concerned she was also having an asthma attack. Patient denies a fevers, cough, leg swelling, or history of DVT. Patient does not know why she would be having a panic attack, no recent stressors. Denies depression, SI, or HI.  Past Medical History  Diagnosis Date  . Medical history non-contributory   . Psychosis 2013  . Drug abuse    Past Surgical History  Procedure Laterality Date  . No past surgeries     Family History  Problem Relation Age of Onset  . Diabetes Mother   . Schizophrenia Mother   . Diabetes Brother   . Hypertension Maternal Aunt    Social History  Substance Use Topics  . Smoking status: Current Some Day Smoker -- 0.25 packs/day    Types: Cigarettes  . Smokeless tobacco: None  . Alcohol Use: No   OB History    Gravida Para Term Preterm AB TAB SAB Ectopic Multiple Living   Review of Systems  Constitutional: Negative for fever.  HENT:       Dry mouth  Respiratory: Positive for chest tightness and shortness of breath. Negative for cough.   Cardiovascular: Negative for leg swelling.  Psychiatric/Behavioral: Negative for suicidal ideas, self-injury and dysphoric mood.  All other systems reviewed and are negative.     Allergies  Review of patient's allergies indicates no known  allergies.  Home Medications   Prior to Admission medications   Medication Sig Start Date End Date Taking? Authorizing Provider  benztropine (COGENTIN) 0.5 MG tablet Take 1 tablet (0.5 mg total) by mouth 2 (two) times daily. 04/17/15  Yes Oneta Rack, NP  dextromethorphan (DELSYM) 30 MG/5ML liquid Take 60 mg by mouth as needed for cough.   Yes Historical Provider, MD  haloperidol (HALDOL) 5 MG tablet Take 1 tablet (5 mg total) by mouth at bedtime. 04/17/15  Yes Oneta Rack, NP  sertraline (ZOLOFT) 50 MG tablet Take 1 tablet (50 mg total) by mouth daily. 04/17/15  Yes Oneta Rack, NP   BP 110/74 mmHg  Pulse 69  Temp(Src) 98.2 F (36.8 C) (Oral)  Resp 16  SpO2 100%  LMP 02/11/2015 Physical Exam  Constitutional: She is oriented to person, place, and time. She appears well-developed and well-nourished.  Asleep when I first walk into the room  HENT:  Head: Normocephalic and atraumatic.  Right Ear: External ear normal.  Left Ear: External ear normal.  Nose: Nose normal.  Mouth/Throat: Oropharynx is clear and moist.  Eyes: Right eye exhibits no discharge. Left eye exhibits no discharge.  Neck: Neck supple.  Cardiovascular: Normal rate, regular rhythm and normal heart sounds.   Pulmonary/Chest: Effort normal and breath sounds normal.  Abdominal: Soft. She exhibits no distension. There is no tenderness.  Neurological: She is alert and  oriented to person, place, and time.  Skin: Skin is warm and dry.  Psychiatric: She has a normal mood and affect. Her speech is normal and behavior is normal. Her mood appears not anxious. She expresses no homicidal and no suicidal ideation.  Nursing note and vitals reviewed.   ED Course  Procedures (including critical care time) Labs Review Labs Reviewed - No data to display  Imaging Review No results found. I have personally reviewed and evaluated these images and lab results as part of my medical decision-making.   EKG  Interpretation   Date/Time:  Friday May 07 2015 12:29:03 EST Ventricular Rate:  75 PR Interval:  105 QRS Duration: 105 QT Interval:  406 QTC Calculation: 453 R Axis:   81 Text Interpretation:  Age not entered, assumed to be  29 years old for  purpose of ECG interpretation Sinus rhythm Short PR interval RSR' in V1 or  V2, probably normal variant Left ventricular hypertrophy no significant  change since Oct 2016 Confirmed by Criss AlvineGOLDSTON  MD, Farhaan Mabee [phone]([id number]) on  05/07/2015 2:06:09 PM      MDM   Final diagnoses:  Panic attack    Patient presents with what she feels is a typical panic attack for her except a little more severe. Her exam is normal including no wheezing and no signs of acute psychosis or psychiatric illness. Unclear why she had a panic attack but it is currently resolved. Patient appears stable for discharge. Extremely low suspicion for ACS, PE, pneumonia, or other emergent pathology. Discussed return precautions.    Pricilla LovelessScott Lajuan Kovaleski, MD 05/07/15 1420

## 2015-08-23 ENCOUNTER — Encounter: Payer: Self-pay | Admitting: *Deleted

## 2015-08-23 ENCOUNTER — Ambulatory Visit (INDEPENDENT_AMBULATORY_CARE_PROVIDER_SITE_OTHER): Payer: Self-pay | Admitting: *Deleted

## 2015-08-23 DIAGNOSIS — Z3201 Encounter for pregnancy test, result positive: Secondary | ICD-10-CM

## 2015-08-23 DIAGNOSIS — Z3490 Encounter for supervision of normal pregnancy, unspecified, unspecified trimester: Secondary | ICD-10-CM

## 2015-08-23 LAB — POCT PREGNANCY, URINE
Preg Test, Ur: POSITIVE — AB
Preg Test, Ur: POSITIVE — AB

## 2015-09-03 ENCOUNTER — Inpatient Hospital Stay (HOSPITAL_COMMUNITY)
Admission: AD | Admit: 2015-09-03 | Discharge: 2015-09-03 | Disposition: A | Discharge status: Home / Self Care | Payer: Medicaid Other | Source: Ambulatory Visit | Attending: Obstetrics & Gynecology | Admitting: Obstetrics & Gynecology

## 2015-09-03 ENCOUNTER — Encounter (HOSPITAL_COMMUNITY): Payer: Self-pay | Admitting: Student

## 2015-09-03 DIAGNOSIS — Z87891 Personal history of nicotine dependence: Secondary | ICD-10-CM | POA: Insufficient documentation

## 2015-09-03 DIAGNOSIS — O210 Mild hyperemesis gravidarum: Secondary | ICD-10-CM | POA: Diagnosis present

## 2015-09-03 DIAGNOSIS — O219 Vomiting of pregnancy, unspecified: Secondary | ICD-10-CM

## 2015-09-03 DIAGNOSIS — Z3A01 Less than 8 weeks gestation of pregnancy: Secondary | ICD-10-CM | POA: Insufficient documentation

## 2015-09-03 LAB — CBC
HCT: 35.1 % — ABNORMAL LOW (ref 36.0–46.0)
HEMOGLOBIN: 11.8 g/dL — AB (ref 12.0–15.0)
MCH: 24.2 pg — AB (ref 26.0–34.0)
MCHC: 33.6 g/dL (ref 30.0–36.0)
MCV: 72.1 fL — AB (ref 78.0–100.0)
PLATELETS: 373 10*3/uL (ref 150–400)
RBC: 4.87 MIL/uL (ref 3.87–5.11)
RDW: 14.4 % (ref 11.5–15.5)
WBC: 10.4 10*3/uL (ref 4.0–10.5)

## 2015-09-03 LAB — URINALYSIS, ROUTINE W REFLEX MICROSCOPIC
BILIRUBIN URINE: NEGATIVE
GLUCOSE, UA: NEGATIVE mg/dL
Hgb urine dipstick: NEGATIVE
Ketones, ur: 15 mg/dL — AB
NITRITE: NEGATIVE
PH: 6 (ref 5.0–8.0)
Protein, ur: NEGATIVE mg/dL
SPECIFIC GRAVITY, URINE: 1.02 (ref 1.005–1.030)

## 2015-09-03 LAB — COMPREHENSIVE METABOLIC PANEL
ALK PHOS: 40 U/L (ref 38–126)
ALT: 13 U/L — AB (ref 14–54)
ANION GAP: 5 (ref 5–15)
AST: 17 U/L (ref 15–41)
Albumin: 4.1 g/dL (ref 3.5–5.0)
BUN: 11 mg/dL (ref 6–20)
CALCIUM: 8.8 mg/dL — AB (ref 8.9–10.3)
CO2: 25 mmol/L (ref 22–32)
CREATININE: 0.72 mg/dL (ref 0.44–1.00)
Chloride: 105 mmol/L (ref 101–111)
Glucose, Bld: 83 mg/dL (ref 65–99)
Potassium: 3.3 mmol/L — ABNORMAL LOW (ref 3.5–5.1)
Sodium: 135 mmol/L (ref 135–145)
Total Bilirubin: 0.7 mg/dL (ref 0.3–1.2)
Total Protein: 7.9 g/dL (ref 6.5–8.1)

## 2015-09-03 LAB — URINE MICROSCOPIC-ADD ON

## 2015-09-03 MED ORDER — PROMETHAZINE HCL 25 MG PO TABS: 25.0000 mg | ORAL_TABLET | Freq: Four times a day (QID) | ORAL | Status: DC | PRN

## 2015-09-03 MED ORDER — PROMETHAZINE HCL 25 MG/ML IJ SOLN
25.0000 mg | Freq: Once | INTRAMUSCULAR | Status: AC
  Administered 2015-09-03: 25 mg via INTRAVENOUS
  Filled 2015-09-03: qty 1

## 2015-09-03 MED ORDER — DEXTROSE IN LACTATED RINGERS 5 % IV SOLN
INTRAVENOUS | Status: DC
  Administered 2015-09-03: 21:00:00 via INTRAVENOUS

## 2015-09-03 NOTE — MAU Note (Signed)
Pt unable to stay to finish her bag of IV fluids due to transportation. Pt stated her nausea is improved and has not vomited since she has been in the hospital. Notified M.Williams,CNM Discharged pt with phenergan RX.

## 2015-09-03 NOTE — MAU Provider Note (Signed)
Chief Complaint: Emesis During Pregnancy and Dizziness   First Provider Initiated Contact with Patient 09/03/15 2106        SUBJECTIVE  HPI: Debra Sullivan is a 30 y.o. [id number] at [redacted]w[redacted]d by LMP who presents to maternity admissions reporting nausea and vomiting.  Has not tried any meds for this.  Has been eating crackers with some success.    She denies vaginal bleeding, vaginal itching/burning, urinary symptoms, h/a, dizziness, or fever/chills.   [facility]   Emesis  This is a recurrent problem. The current episode started 1 to 4 weeks ago. The problem occurs 2 to 4 times per day. The problem has been unchanged. There has been no fever. Pertinent negatives include no abdominal pain, chest pain, chills, coughing, diarrhea, dizziness, headaches or myalgias. She has tried nothing for the symptoms.   RN Note: Patient keeps throwing up every hour, passed out earlier today.            Past Medical History  Diagnosis Date  . Medical history non-contributory   . Psychosis 2013  . Drug abuse    Past Surgical History  Procedure Laterality Date  . No past surgeries     Social History   Social History  . Marital Status: Single    Spouse Name: N/A  . Number of Children: N/A  . Years of Education: N/A   Occupational History  . Not on file.   Social History Main Topics  . Smoking status: Former Smoker -- 0.25 packs/day    Types: Cigarettes    Quit date: 08/06/2015  . Smokeless tobacco: Not on file  . Alcohol Use: No  . Drug Use: Yes    Special: Marijuana, Cocaine     Comment: not used in one month  . Sexual Activity: Yes    Birth Control/ Protection: None   Other Topics Concern  . Not on file   Social History Narrative   No current facility-administered medications on file prior to encounter.   Current Outpatient Prescriptions on File Prior to Encounter  Medication Sig Dispense Refill  . benztropine (COGENTIN) 0.5 MG tablet Take 1 tablet  (0.5 mg total) by mouth 2 (two) times daily. 30 tablet 0  . dextromethorphan (DELSYM) 30 MG/5ML liquid Take 60 mg by mouth as needed for cough.    . haloperidol (HALDOL) 5 MG tablet Take 1 tablet (5 mg total) by mouth at bedtime. 30 tablet 0  . sertraline (ZOLOFT) 50 MG tablet Take 1 tablet (50 mg total) by mouth daily. 30 tablet 0   No Known Allergies  I have reviewed patient's Past Medical Hx, Surgical Hx, Family Hx, Social Hx, medications and allergies.   ROS:  Review of Systems  Constitutional: Negative for chills.  Respiratory: Negative for cough.   Cardiovascular: Negative for chest pain.  Gastrointestinal: Positive for nausea and vomiting. Negative for abdominal pain, diarrhea and constipation.  Genitourinary: Negative for vaginal bleeding and pelvic pain.  Musculoskeletal: Negative for myalgias.  Neurological: Negative for dizziness and headaches.     Physical Exam  Patient Vitals for the past 24 hrs:  BP Temp Temp src Pulse Resp SpO2 Height Weight  09/03/15 1817 124/68 mmHg 97.9 F (36.6 C) Oral 84 18 100 %  (1.651 m) 136 lb (61.689 kg)     Physical Exam  Constitutional: Well-developed, well-nourished female in no acute distress.  Cardiovascular: normal rate and rhythm Respiratory: normal effort GI: Abd soft, non-tender. Pos BS x 4 MS:  Extremities nontender, no edema, normal ROM Neurologic: Alert and oriented x 4.  GU: Neg CVAT.  LAB RESULTS Results for orders placed or performed during the hospital encounter of 09/03/15 (from the past 24 hour(s))  Urinalysis, Routine w reflex microscopic (not at [facility])     Status: Abnormal   Collection Time: 09/03/15  6:25 PM  Result Value Ref Range   Color, Urine YELLOW YELLOW   APPearance CLEAR CLEAR   Specific Gravity, Urine 1.020 1.005 - 1.030   pH 6.0 5.0 - 8.0   Glucose, UA NEGATIVE NEGATIVE mg/dL   Hgb urine dipstick NEGATIVE NEGATIVE   Bilirubin Urine NEGATIVE NEGATIVE   Ketones, ur 15 (A) NEGATIVE mg/dL    Protein, ur NEGATIVE NEGATIVE mg/dL   Nitrite NEGATIVE NEGATIVE   Leukocytes, UA SMALL (A) NEGATIVE  Urine microscopic-add on     Status: Abnormal   Collection Time: 09/03/15  6:25 PM  Result Value Ref Range   Squamous Epithelial / LPF 0-5 (A) NONE SEEN   WBC, UA 0-5 0 - 5 WBC/hpf   RBC / HPF 0-5 0 - 5 RBC/hpf   Bacteria, UA FEW (A) NONE SEEN   Urine-Other MUCOUS PRESENT   CBC     Status: Abnormal   Collection Time: 09/03/15  6:50 PM  Result Value Ref Range   WBC 10.4 4.0 - 10.5 K/uL   RBC 4.87 3.87 - 5.11 MIL/uL   Hemoglobin 11.8 (L) 12.0 - 15.0 g/dL   HCT 40.9 (L) 81.1 - 91.4 %   MCV 72.1 (L) 78.0 - 100.0 fL   MCH 24.2 (L) 26.0 - 34.0 pg   MCHC 33.6 30.0 - 36.0 g/dL   RDW 78.2 95.6 - 21.3 %   Platelets 373 150 - 400 K/uL  Comprehensive metabolic panel     Status: Abnormal   Collection Time: 09/03/15  6:50 PM  Result Value Ref Range   Sodium 135 135 - 145 mmol/L   Potassium 3.3 (L) 3.5 - 5.1 mmol/L   Chloride 105 101 - 111 mmol/L   CO2 25 22 - 32 mmol/L   Glucose, Bld 83 65 - 99 mg/dL   BUN 11 6 - 20 mg/dL   Creatinine, Ser 0.86 0.44 - 1.00 mg/dL   Calcium 8.8 (L) 8.9 - 10.3 mg/dL   Total Protein 7.9 6.5 - 8.1 g/dL   Albumin 4.1 3.5 - 5.0 g/dL   AST 17 15 - 41 U/L   ALT 13 (L) 14 - 54 U/L   Alkaline Phosphatase 40 38 - 126 U/L   Total Bilirubin 0.7 0.3 - 1.2 mg/dL   GFR calc non Af Amer >60 >60 mL/min   GFR calc Af Amer >60 >60 mL/min   Anion gap 5 5 - 15       IMAGING No results found.  MAU Management/MDM: Ordered labs and reviewed results.     Treatments in MAU included IV hydration and antiemetic.   Felt better after IV hydration, able to keep down sips, wants to go home.  Pt stable at time of discharge.  ASSESSMENT 1. Nausea/vomiting in pregnancy     PLAN Discharge home Rx Phenergan for nausea and vomiting, May use vaginally if necessary Advance diet as tolerated [facility] as scheduled end of the month     Medication List     TAKE these medications        benztropine 0.5 MG tablet  Commonly known as:  COGENTIN  Take 1 tablet (0.5 mg  total) by mouth 2 (two) times daily.     dextromethorphan 30 MG/5ML liquid  Commonly known as:  DELSYM  Take 60 mg by mouth as needed for cough.     haloperidol 5 MG tablet  Commonly known as:  HALDOL  Take 1 tablet (5 mg total) by mouth at bedtime.     promethazine 25 MG tablet  Commonly known as:  PHENERGAN  Take 1 tablet (25 mg total) by mouth every 6 (six) hours as needed for nausea or vomiting.     sertraline 50 MG tablet  Commonly known as:  ZOLOFT  Take 1 tablet (50 mg total) by mouth daily.           Follow-up Information    [facility]   Contact information:   [address] [place] [id number] [phone]       Encouraged to return here or to other Urgent Care/ED if she develops worsening of symptoms, increase in pain, fever, or other concerning symptoms.    Wynelle BourgeoisMarie Margeret Stachnik CNM, MSN Certified Nurse-Midwife 09/03/2015  9:48 PM

## 2015-09-03 NOTE — Discharge Instructions (Signed)

## 2015-09-03 NOTE — MAU Note (Signed)
Patient keeps throwing up every hour, passed out earlier today.

## 2015-09-22 ENCOUNTER — Encounter: Payer: Self-pay | Admitting: Certified Nurse Midwife

## 2015-09-22 ENCOUNTER — Ambulatory Visit (INDEPENDENT_AMBULATORY_CARE_PROVIDER_SITE_OTHER): Payer: Medicaid Other | Admitting: Certified Nurse Midwife

## 2015-09-22 VITALS — BP 95/67 | HR 76 | Temp 97.9°F | Wt 138.0 lb

## 2015-09-22 DIAGNOSIS — O26891 Other specified pregnancy related conditions, first trimester: Secondary | ICD-10-CM

## 2015-09-22 DIAGNOSIS — Z3481 Encounter for supervision of other normal pregnancy, first trimester: Secondary | ICD-10-CM | POA: Diagnosis not present

## 2015-09-22 DIAGNOSIS — O219 Vomiting of pregnancy, unspecified: Secondary | ICD-10-CM | POA: Diagnosis not present

## 2015-09-22 DIAGNOSIS — O3680X0 Pregnancy with inconclusive fetal viability, not applicable or unspecified: Secondary | ICD-10-CM

## 2015-09-22 DIAGNOSIS — R51 Headache: Secondary | ICD-10-CM

## 2015-09-22 DIAGNOSIS — Z3687 Encounter for antenatal screening for uncertain dates: Secondary | ICD-10-CM

## 2015-09-22 LAB — POCT URINALYSIS DIPSTICK
Bilirubin, UA: NEGATIVE
Glucose, UA: NEGATIVE
KETONES UA: NEGATIVE
Nitrite, UA: NEGATIVE
PH UA: 6
RBC UA: NEGATIVE
SPEC GRAV UA: 1.015
Urobilinogen, UA: NEGATIVE

## 2015-09-22 MED ORDER — BUTALBITAL-APAP-CAFFEINE 50-325-40 MG PO TABS: 1.0000 | ORAL_TABLET | Freq: Four times a day (QID) | ORAL | Status: AC | PRN

## 2015-09-22 MED ORDER — VITAFOL GUMMIES 3.33-0.333-34.8 MG PO CHEW: 3.0000 | CHEWABLE_TABLET | Freq: Every day | ORAL | Status: DC

## 2015-09-22 MED ORDER — DOXYLAMINE-PYRIDOXINE 10-10 MG PO TBEC: DELAYED_RELEASE_TABLET | ORAL | Status: DC

## 2015-09-22 NOTE — Progress Notes (Signed)
Subjective:    Debra Sullivan is being seen today for her first obstetrical visit.  This is not a planned pregnancy. She is at 238w4d gestation. Her obstetrical history is significant for recently stopped smoking. Mariajuana use this pregnancy.  Relationship with FOB: significant other, living together. Patient does intend to breast feed. Pregnancy history fully reviewed.  The information documented in the HPI was reviewed and verified.  Menstrual History: OB History    Gravida Para Term Preterm AB TAB SAB Ectopic Multiple Living   5 3 3  1  1   3       Menarche age: 30 years of age.    Patient's last menstrual period was 07/17/2015.    Past Medical History  Diagnosis Date  . Medical history non-contributory   . Psychosis 2013  . Drug abuse     Past Surgical History  Procedure Laterality Date  . No past surgeries       (Not in a hospital admission) No Known Allergies  Social History  Substance Use Topics  . Smoking status: Former Smoker -- 0.25 packs/day    Types: Cigarettes    Quit date: 08/06/2015  . Smokeless tobacco: Not on file  . Alcohol Use: No    Family History  Problem Relation Age of Onset  . Diabetes Mother   . Schizophrenia Mother   . Diabetes Brother   . Hypertension Maternal Aunt      Review of Systems Constitutional: negative for weight loss Gastrointestinal: + for nausea & vomiting Genitourinary:negative for genital lesions and vaginal discharge and dysuria Musculoskeletal:negative for back pain Behavioral/Psych: negative for abusive relationship, depression, illegal drug usage and tobacco use    Objective:    BP 95/67 mmHg  Pulse 76  Temp(Src) 97.9 F (36.6 C)  Wt 138 lb (62.596 kg)  LMP 07/17/2015 General Appearance:    Alert, cooperative, no distress, appears stated age  Head:    Normocephalic, without obvious abnormality, atraumatic  Eyes:    PERRL, conjunctiva/corneas clear, EOM's intact, fundi    benign, both eyes  Ears:    Normal  TM's and external ear canals, both ears  Nose:   Nares normal, septum midline, mucosa normal, no drainage    or sinus tenderness  Throat:   Lips, mucosa, and tongue normal; teeth and gums normal  Neck:   Supple, symmetrical, trachea midline, no adenopathy;    thyroid:  no enlargement/tenderness/nodules; no carotid   bruit or JVD  Back:     Symmetric, no curvature, ROM normal, no CVA tenderness  Lungs:     Clear to auscultation bilaterally, respirations unlabored  Chest Wall:    No tenderness or deformity   Heart:    Regular rate and rhythm, S1 and S2 normal, no murmur, rub   or gallop  Breast Exam:    No tenderness, masses, or nipple abnormality  Abdomen:     Soft, non-tender, bowel sounds active all four quadrants,    no masses, no organomegaly  Genitalia:    Normal female without lesion, discharge or tenderness  Extremities:   Extremities normal, atraumatic, no cyanosis or edema  Pulses:   2+ and symmetric all extremities  Skin:   Skin color, texture, turgor normal, no rashes or lesions  Lymph nodes:   Cervical, supraclavicular, and axillary nodes normal  Neurologic:   CNII-XII intact, normal strength, sensation and reflexes    throughout         Cervix:  Long, thick, closed and posterior.  FHR: seen with bedside US, 2 amniotic sacs seen with bedside US, FHR seen with one          Fetus along with fetal movement, other fetus appears to be a yoke sack.      Lab Review Urine pregnancy test Labs reviewed yes Radiologic studies reviewed no Assessment:    Pregnancy at [redacted]w[redacted]d weeks   Nausea in early pregnancy  ?Twin pregnancy  + THC use this pregnancy  Plan:      Prenatal vitamins.  Counseling provided regarding continued use of seat belts, cessation of alcohol consumption, smoking or use of illicit drugs; infection precautions i.e., influenza/TDAP immunizations, toxoplasmosis,CMV, parvovirus, listeria and varicella; workplace safety, exercise during pregnancy; routine dental care,  safe medications, sexual activity, hot tubs, saunas, pools, travel, caffeine use, fish and methlymercury, potential toxins, hair treatments, varicose veins Weight gain recommendations per IOM guidelines reviewed: underweight/BMI< 18.5--> gain 28 - 40 lbs; normal weight/BMI 18.5 - 24.9--> gain 25 - 35 lbs; overweight/BMI 25 - 29.9--> gain 15 - 25 lbs; obese/BMI >30->gain  11 - 20 lbs Problem list reviewed and updated. FIRST/CF mutation testing/NIPT/QUAD SCREEN/fragile X/Ashkenazi Jewish population testing/Spinal muscular atrophy discussed: requested. Role of ultrasound in pregnancy discussed; fetal survey: requested. Amniocentesis discussed: not indicated. VBAC calculator score: VBAC consent form provided Meds ordered this encounter  Medications  . Doxylamine-Pyridoxine (DICLEGIS) 10-10 MG TBEC    Sig: Take 1 tablet with breakfast and lunch.  Take 2 tablets at bedtime.    Dispense:  100 tablet    Refill:  4  . Prenatal Vit-Fe Phos-FA-Omega (VITAFOL GUMMIES) 3.33-0.333-34.8 MG CHEW    Sig: Chew 3 tablets by mouth daily.    Dispense:  90 tablet    Refill:  12  . butalbital-acetaminophen-caffeine (FIORICET) 50-325-40 MG tablet    Sig: Take 1-2 tablets by mouth every 6 (six) hours as needed for headache.    Dispense:  45 tablet    Refill:  5   Orders Placed This Encounter  Procedures  . Culture, OB Urine  . US OB Comp Less 14 Wks    Standing Status: Future     Number of Occurrences:      Standing Expiration Date: 11/21/2016    Order Specific Question:  Reason for Exam (SYMPTOM  OR DIAGNOSIS REQUIRED)    Answer:  ?twin pregnancy, dating, viability    Order Specific Question:  Preferred imaging location?    Answer:  [facility]  . US OB Transvaginal    Standing Status: Future     Number of Occurrences:      Standing Expiration Date: 11/21/2016    Order Specific Question:  Reason for Exam (SYMPTOM  OR DIAGNOSIS REQUIRED)    Answer:  dating, viability, ?twin preg.    Order Specific  Question:  Preferred imaging location?    Answer:  [facility]  . HIV antibody  . Hemoglobinopathy evaluation  . Varicella zoster antibody, IgG  . VITAMIN D 25 Hydroxy (Vit-D Deficiency, Fractures)  . Prenatal Profile I  . NuSwab Vaginitis Plus (VG+)  . Drugs of abuse scrn w alc, routine urine  . [id number] 7+Alc-Unbund  . POCT urinalysis dipstick    Follow up in 4 weeks. 50% of 30 min visit spent on counseling and coordination of care.

## 2015-09-24 LAB — HEMOGLOBINOPATHY EVALUATION
HEMOGLOBIN F QUANTITATION: 0 % (ref 0.0–2.0)
HGB A: 97.6 % (ref 94.0–98.0)
HGB C: 0 %
HGB S: 0 %
Hemoglobin A2 Quantitation: 2.4 % (ref 0.7–3.1)

## 2015-09-24 LAB — PRENATAL PROFILE I(LABCORP)
Antibody Screen: NEGATIVE
BASOS ABS: 0 10*3/uL (ref 0.0–0.2)
Basos: 0 %
EOS (ABSOLUTE): 0.1 10*3/uL (ref 0.0–0.4)
EOS: 1 %
HEP B S AG: NEGATIVE
Hematocrit: 36.3 % (ref 34.0–46.6)
Hemoglobin: 11.8 g/dL (ref 11.1–15.9)
IMMATURE GRANS (ABS): 0 10*3/uL (ref 0.0–0.1)
IMMATURE GRANULOCYTES: 0 %
LYMPHS: 31 %
Lymphocytes Absolute: 2.3 10*3/uL (ref 0.7–3.1)
MCH: 24.5 pg — AB (ref 26.6–33.0)
MCHC: 32.5 g/dL (ref 31.5–35.7)
MCV: 75 fL — ABNORMAL LOW (ref 79–97)
MONOCYTES: 5 %
Monocytes Absolute: 0.4 10*3/uL (ref 0.1–0.9)
NEUTROS ABS: 4.7 10*3/uL (ref 1.4–7.0)
NEUTROS PCT: 63 %
Platelets: 399 10*3/uL — ABNORMAL HIGH (ref 150–379)
RBC: 4.82 x10E6/uL (ref 3.77–5.28)
RDW: 15.7 % — ABNORMAL HIGH (ref 12.3–15.4)
RH TYPE: POSITIVE
RPR Ser Ql: NONREACTIVE
Rubella Antibodies, IGG: 1.24 index (ref >0.99)
WBC: 7.4 10*3/uL (ref 3.4–10.8)

## 2015-09-24 LAB — VITAMIN D 25 HYDROXY (VIT D DEFICIENCY, FRACTURES): VIT D 25 HYDROXY: 18.3 ng/mL — AB (ref 30.0–100.0)

## 2015-09-24 LAB — VARICELLA ZOSTER ANTIBODY, IGG: Varicella zoster IgG: 529 index (ref >165)

## 2015-09-24 LAB — HIV ANTIBODY (ROUTINE TESTING W REFLEX): HIV Screen 4th Generation wRfx: NONREACTIVE

## 2015-09-25 LAB — CULTURE, OB URINE

## 2015-09-25 LAB — URINE CULTURE, OB REFLEX

## 2015-09-27 ENCOUNTER — Other Ambulatory Visit: Payer: Self-pay | Admitting: Certified Nurse Midwife

## 2015-09-27 DIAGNOSIS — N760 Acute vaginitis: Secondary | ICD-10-CM

## 2015-09-27 DIAGNOSIS — B9689 Other specified bacterial agents as the cause of diseases classified elsewhere: Secondary | ICD-10-CM

## 2015-09-27 LAB — NUSWAB VAGINITIS PLUS (VG+)
ATOPOBIUM VAGINAE: HIGH {score} — AB
BVAB 2: HIGH {score} — AB
CANDIDA ALBICANS, NAA: NEGATIVE
CANDIDA GLABRATA, NAA: NEGATIVE
Chlamydia trachomatis, NAA: POSITIVE — AB
Megasphaera 1: HIGH Score — AB
NEISSERIA GONORRHOEAE, NAA: NEGATIVE
Trich vag by NAA: NEGATIVE

## 2015-09-27 MED ORDER — TERCONAZOLE 0.4 % VA CREA: 1.0000 | TOPICAL_CREAM | Freq: Every day | VAGINAL | Status: DC

## 2015-09-27 MED ORDER — METRONIDAZOLE 0.75 % VA GEL: 1.0000 | Freq: Two times a day (BID) | VAGINAL | Status: DC

## 2015-09-28 ENCOUNTER — Other Ambulatory Visit: Payer: Self-pay | Admitting: Certified Nurse Midwife

## 2015-09-28 ENCOUNTER — Encounter: Payer: Self-pay | Admitting: *Deleted

## 2015-09-28 LAB — PAP IG W/ RFLX HPV ASCU: PAP Smear Comment: 0

## 2015-09-28 LAB — URINE DRUGS OF ABUSE SCREEN W ALC, ROUTINE (REF LAB)
AMPHETAMINES, URINE: NEGATIVE ng/mL
Barbiturate Quant, Ur: NEGATIVE ng/mL
Benzodiazepine Quant, Ur: NEGATIVE ng/mL
Cocaine (Metab.): NEGATIVE ng/mL
ETHANOL U, QUAN: NEGATIVE %
METHADONE SCREEN, URINE: NEGATIVE ng/mL
Opiate Quant, Ur: NEGATIVE ng/mL
PCP Quant, Ur: NEGATIVE ng/mL
PROPOXYPHENE: NEGATIVE ng/mL

## 2015-09-28 LAB — PANEL 799049
CANNABINOID GC/MS UR: POSITIVE — AB
CARBOXY THC GC/MS CONF: 12 ng/mL

## 2015-09-29 ENCOUNTER — Other Ambulatory Visit: Payer: Self-pay | Admitting: Certified Nurse Midwife

## 2015-09-29 ENCOUNTER — Ambulatory Visit (HOSPITAL_COMMUNITY)
Admission: RE | Admit: 2015-09-29 | Discharge: 2015-09-29 | Disposition: A | Discharge status: Home / Self Care | Payer: Medicaid Other | Source: Ambulatory Visit | Attending: Certified Nurse Midwife | Admitting: Certified Nurse Midwife

## 2015-09-29 DIAGNOSIS — O219 Vomiting of pregnancy, unspecified: Secondary | ICD-10-CM

## 2015-09-29 DIAGNOSIS — Z36 Encounter for antenatal screening of mother: Secondary | ICD-10-CM | POA: Insufficient documentation

## 2015-09-29 DIAGNOSIS — Z3481 Encounter for supervision of other normal pregnancy, first trimester: Secondary | ICD-10-CM

## 2015-09-29 DIAGNOSIS — O30001 Twin pregnancy, unspecified number of placenta and unspecified number of amniotic sacs, first trimester: Secondary | ICD-10-CM | POA: Insufficient documentation

## 2015-09-29 DIAGNOSIS — Z3A11 11 weeks gestation of pregnancy: Secondary | ICD-10-CM | POA: Insufficient documentation

## 2015-09-29 DIAGNOSIS — Z3687 Encounter for antenatal screening for uncertain dates: Secondary | ICD-10-CM

## 2015-09-29 DIAGNOSIS — O3680X0 Pregnancy with inconclusive fetal viability, not applicable or unspecified: Secondary | ICD-10-CM

## 2015-10-01 ENCOUNTER — Other Ambulatory Visit: Payer: Self-pay | Admitting: Certified Nurse Midwife

## 2015-10-20 ENCOUNTER — Other Ambulatory Visit: Payer: Self-pay | Admitting: Certified Nurse Midwife

## 2015-10-20 ENCOUNTER — Ambulatory Visit (INDEPENDENT_AMBULATORY_CARE_PROVIDER_SITE_OTHER): Payer: Medicaid Other | Admitting: Certified Nurse Midwife

## 2015-10-20 VITALS — BP 107/58 | HR 88 | Wt 144.0 lb

## 2015-10-20 DIAGNOSIS — O30002 Twin pregnancy, unspecified number of placenta and unspecified number of amniotic sacs, second trimester: Secondary | ICD-10-CM

## 2015-10-20 DIAGNOSIS — A749 Chlamydial infection, unspecified: Secondary | ICD-10-CM

## 2015-10-20 DIAGNOSIS — Z3492 Encounter for supervision of normal pregnancy, unspecified, second trimester: Secondary | ICD-10-CM

## 2015-10-20 LAB — POCT URINALYSIS DIPSTICK
Bilirubin, UA: NEGATIVE
Glucose, UA: NEGATIVE
Ketones, UA: NEGATIVE
Leukocytes, UA: NEGATIVE
NITRITE UA: NEGATIVE
PH UA: 7
Protein, UA: NEGATIVE
RBC UA: NEGATIVE
SPEC GRAV UA: 1.015
UROBILINOGEN UA: NEGATIVE

## 2015-10-20 MED ORDER — CEFIXIME 400 MG PO TABS: 400.0000 mg | ORAL_TABLET | Freq: Every day | ORAL | Status: DC

## 2015-10-20 MED ORDER — AZITHROMYCIN 250 MG PO TABS: ORAL_TABLET | ORAL | Status: DC

## 2015-10-20 NOTE — Progress Notes (Signed)
Patient has no questions or concerns today.

## 2015-10-20 NOTE — Progress Notes (Signed)
Subjective:    Debra Sullivan is being seen today for her obstetrical visit.  She is at 2242w4d gestation.  Patient reports no complaints.   Fetal Movement: normal. States that she stopped smoking marijuana.    Problem List Items Addressed This Visit    None    Visit Diagnoses    Chlamydia    -  Primary    Relevant Medications    cefixime (SUPRAX) 400 MG tablet    azithromycin (ZITHROMAX Z-PAK) 250 MG tablet    Prenatal care, second trimester        Relevant Orders    POCT urinalysis dipstick (Completed)    Twin gestation in second trimester, unspecified placenta and amniotic sac number        Relevant Orders    US MFM OB COMP + 14 WK    US MFM OB COMP ADDL GEST + 14 WK    AMB referral to maternal fetal medicine      Patient Active Problem List   Diagnosis Date Noted  . 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 Cannabis use disorder, severe, dependence (HCC) 04/15/2015  . PTSD (post-traumatic stress disorder) 04/15/2015  . Cocaine use disorder, moderate, in early remission 04/15/2015  . Psychosis 04/14/2015    Objective:    BP 107/58 mmHg  Pulse 88  Wt 144 lb (65.318 kg)  LMP 07/17/2015 Uterine Size: size equals dates   FHR: A; 155, B: 130  Assessment:    Pregnancy 13 and 4/7 weeks. Twins, unknown .   Marijuana use in pregnancy  +CH: results discussed with patient.    Plan:    NIPS 21 today.   Problem list reviewed and updated. Labs reviewed. AFP3 discussed and not recommended in twins. Role of ultrasound discussed; fetal survey recommended. Amniocentesis discussed: not indicated.  Counseled that amniocentesis would require tapping both sacs (with injection of dye) and that risk of miscarriage is higher with additional sacs sampled. Nutrition in twin pregnancy reviewed. Risks of twin pregnancy reviewed, including risk of prematurity, hypertension, diabetes and the possible need for stopping work early or limiting  activities.   Follow-up in 4 weeks. 50% of 15 min visit spent on counseling and coordination of care.

## 2015-10-27 ENCOUNTER — Encounter: Payer: Self-pay | Admitting: *Deleted

## 2015-10-28 LAB — MATERNIT21 PLUS CORE+SCA
CHROMOSOME 21: NEGATIVE
Chromosome 13: NEGATIVE
Chromosome 18: NEGATIVE
PDF: 0
Y CHROMOSOME: DETECTED

## 2015-11-03 ENCOUNTER — Other Ambulatory Visit: Payer: Self-pay | Admitting: Certified Nurse Midwife

## 2015-11-18 ENCOUNTER — Encounter (HOSPITAL_COMMUNITY): Payer: Self-pay | Admitting: Certified Nurse Midwife

## 2015-11-20 ENCOUNTER — Encounter (HOSPITAL_COMMUNITY): Payer: Self-pay

## 2015-11-20 ENCOUNTER — Inpatient Hospital Stay (HOSPITAL_COMMUNITY)
Admission: AD | Admit: 2015-11-20 | Discharge: 2015-11-20 | Disposition: A | Discharge status: Home / Self Care | Payer: Medicaid Other | Source: Ambulatory Visit | Attending: Obstetrics | Admitting: Obstetrics

## 2015-11-20 DIAGNOSIS — M5430 Sciatica, unspecified side: Secondary | ICD-10-CM | POA: Insufficient documentation

## 2015-11-20 DIAGNOSIS — O9989 Other specified diseases and conditions complicating pregnancy, childbirth and the puerperium: Secondary | ICD-10-CM

## 2015-11-20 DIAGNOSIS — O30002 Twin pregnancy, unspecified number of placenta and unspecified number of amniotic sacs, second trimester: Secondary | ICD-10-CM | POA: Diagnosis not present

## 2015-11-20 DIAGNOSIS — O26892 Other specified pregnancy related conditions, second trimester: Secondary | ICD-10-CM | POA: Insufficient documentation

## 2015-11-20 DIAGNOSIS — R102 Pelvic and perineal pain: Secondary | ICD-10-CM | POA: Diagnosis present

## 2015-11-20 DIAGNOSIS — Z87891 Personal history of nicotine dependence: Secondary | ICD-10-CM | POA: Insufficient documentation

## 2015-11-20 DIAGNOSIS — N949 Unspecified condition associated with female genital organs and menstrual cycle: Secondary | ICD-10-CM

## 2015-11-20 DIAGNOSIS — Z3A18 18 weeks gestation of pregnancy: Secondary | ICD-10-CM | POA: Insufficient documentation

## 2015-11-20 LAB — URINE MICROSCOPIC-ADD ON: RBC / HPF: NONE SEEN RBC/hpf (ref 0–5)

## 2015-11-20 LAB — URINALYSIS, ROUTINE W REFLEX MICROSCOPIC
BILIRUBIN URINE: NEGATIVE
Glucose, UA: NEGATIVE mg/dL
HGB URINE DIPSTICK: NEGATIVE
Ketones, ur: NEGATIVE mg/dL
Nitrite: NEGATIVE
PROTEIN: NEGATIVE mg/dL
Specific Gravity, Urine: 1.015 (ref 1.005–1.030)
pH: 7.5 (ref 5.0–8.0)

## 2015-11-20 NOTE — MAU Provider Note (Signed)
History     CSN: [id number]  Arrival date and time: 11/20/15 9147   First Provider Initiated Contact with Patient 11/20/15 1107      Chief Complaint  Patient presents with  . Pelvic Pain  . Back Pain   HPI Debra Sullivan 30 y.o. [redacted]w[redacted]d  Twin gestation.  Comes to MAU today with pain in lower abdomen bilaterally, pain on her sides when lying in bed and pain shooting down her back and across her hips.  Denies having any uterine tightening.  Denies any vaginal discharge, vaginal leaking or vaginal bleeding.  Reports the pain is on her sides from armpits to the sides of her hips and from her midback to the back of her leg going down across the middle of her buttock.  Also has periodic pain on each side of the pubic symphysis with sharp pains that go down her thighs.  No other problems are reported.  OB History    Gravida Para Term Preterm AB TAB SAB Ectopic Multiple Living   Past Medical History  Diagnosis Date  . Medical history non-contributory   . Psychosis 2013  . Drug abuse     Past Surgical History  Procedure Laterality Date  . No past surgeries      Family History  Problem Relation Age of Onset  . Diabetes Mother   . Schizophrenia Mother   . Diabetes Brother   . Hypertension Maternal Aunt     Social History  Substance Use Topics  . Smoking status: Former Smoker -- 0.25 packs/day    Types: Cigarettes    Quit date: 08/06/2015  . Smokeless tobacco: None  . Alcohol Use: No    Allergies: No Known Allergies  Prescriptions prior to admission  Medication Sig Dispense Refill Last Dose  . Prenatal Vit-Fe Phos-FA-Omega (VITAFOL GUMMIES) 3.33-0.333-34.8 MG CHEW Chew 3 tablets by mouth daily. 90 tablet 12 11/19/2015 at Unknown time  . azithromycin (ZITHROMAX Z-PAK) 250 MG tablet Take as 1 dose. (Patient not taking: Reported on 11/20/2015) 4 tablet 0   . butalbital-acetaminophen-caffeine (FIORICET) 50-325-40 MG tablet Take 1-2 tablets by mouth every 6  (six) hours as needed for headache. 45 tablet 5 prn  . cefixime (SUPRAX) 400 MG tablet Take 1 tablet (400 mg total) by mouth daily. (Patient not taking: Reported on 11/20/2015) 1 tablet 0   . Doxylamine-Pyridoxine (DICLEGIS) 10-10 MG TBEC Take 1 tablet with breakfast and lunch.  Take 2 tablets at bedtime. (Patient not taking: Reported on 11/20/2015) 100 tablet 4 Taking  . metroNIDAZOLE (METROGEL VAGINAL) 0.75 % vaginal gel Place 1 Applicatorful vaginally 2 (two) times daily. (Patient not taking: Reported on 11/20/2015) 70 g 0 Taking  . promethazine (PHENERGAN) 25 MG tablet Take 1 tablet (25 mg total) by mouth every 6 (six) hours as needed for nausea or vomiting. (Patient not taking: Reported on 11/20/2015) 30 tablet 0 Taking  . terconazole (TERAZOL 7) 0.4 % vaginal cream Place 1 applicator vaginally at bedtime. (Patient not taking: Reported on 11/20/2015) 45 g 0 Taking    Review of Systems  Constitutional: Negative for fever.  Gastrointestinal: Negative for nausea and vomiting.       Pain in groin bilaterally  Genitourinary:       No vaginal discharge. No vaginal bleeding. No dysuria.  Musculoskeletal: Positive for back pain.  Neurological: Negative for headaches.   Physical Exam   Blood pressure 121/66, pulse 93,  temperature 99.1 F (37.3 C), temperature source Oral, resp. rate 16, height 5' (1.524 m), weight 160 lb (72.576 kg), last menstrual period 07/17/2015.  Physical Exam  Nursing note and vitals reviewed. Constitutional: She is oriented to person, place, and time. She appears well-developed and well-nourished.  HENT:  Head: Normocephalic.  Eyes: EOM are normal.  Neck: Neck supple.  GI: Soft. There is no tenderness.  Fundus 1 FB above the umbilicus.  No contractions palpated.  Client points to where the sharp pains go down from the lower part of her abdomen to her upper legs - classic for round ligament pain.  Musculoskeletal: Normal range of motion.  Neurological: She is alert and  oriented to person, place, and time.  No deficits when walking.  No limitations with functioning of her legs.  Skin: Skin is warm and dry.  Psychiatric: She has a normal mood and affect.    MAU Course  Procedures Results for orders placed or performed during the hospital encounter of 11/20/15 (from the past 24 hour(s))  Urinalysis, Routine w reflex microscopic (not at [facility])     Status: Abnormal   Collection Time: 11/20/15 10:16 AM  Result Value Ref Range   Color, Urine YELLOW YELLOW   APPearance HAZY (A) CLEAR   Specific Gravity, Urine 1.015 1.005 - 1.030   pH 7.5 5.0 - 8.0   Glucose, UA NEGATIVE NEGATIVE mg/dL   Hgb urine dipstick NEGATIVE NEGATIVE   Bilirubin Urine NEGATIVE NEGATIVE   Ketones, ur NEGATIVE NEGATIVE mg/dL   Protein, ur NEGATIVE NEGATIVE mg/dL   Nitrite NEGATIVE NEGATIVE   Leukocytes, UA MODERATE (A) NEGATIVE  Urine microscopic-add on     Status: Abnormal   Collection Time: 11/20/15 10:16 AM  Result Value Ref Range   Squamous Epithelial / LPF 6-30 (A) NONE SEEN   WBC, UA 6-30 0 - 5 WBC/hpf   RBC / HPF NONE SEEN 0 - 5 RBC/hpf   Bacteria, UA FEW (A) NONE SEEN   Urine-Other AMORPHOUS URATES/PHOSPHATES     MDM Symptoms are not indicative of preterm labor but are more classic for general pregnancy symptoms of sciatica and round ligament pain.  Has an appointment on Friday for ultrasound and ob visit.  Assessment and Plan  Round ligament pain Sciatic pain in pregnancy  Plan Return if you have any leaking of fluid, contractions or vaginal bleeding Urine culture pending. Expect the shooting pains in the lower abdomen and going down your legs to improve in a few weeks Back pain shooting down across your hips is common in pregnancy. Keep your appointments for ultrasound and an office visit as scheduled on Friday.  Dangelo Guzzetta 11/20/2015, 11:16 AM

## 2015-11-20 NOTE — Discharge Instructions (Signed)
Return if you have any leaking of fluid, contractions or vaginal bleeding Urine culture pending. Expect the shooting pains in the lower abdomen and going down your legs to improve in a few weeks Back pain shooting down across your hips is common in pregnancy. Keep your appointments for ultrasound and an office visit as scheduled on Friday.

## 2015-11-20 NOTE — MAU Note (Addendum)
Onset of lower abdominal pain and back pain since 2:30 am, denies vaginal bleeding worse when laying down

## 2015-11-21 LAB — CULTURE, OB URINE

## 2015-11-26 ENCOUNTER — Encounter (HOSPITAL_COMMUNITY): Payer: Self-pay

## 2015-11-26 ENCOUNTER — Ambulatory Visit (INDEPENDENT_AMBULATORY_CARE_PROVIDER_SITE_OTHER): Payer: Medicaid Other | Admitting: Obstetrics

## 2015-11-26 ENCOUNTER — Ambulatory Visit (HOSPITAL_COMMUNITY)
Admission: RE | Admit: 2015-11-26 | Discharge: 2015-11-26 | Disposition: A | Discharge status: Home / Self Care | Payer: Medicaid Other | Source: Ambulatory Visit | Attending: Certified Nurse Midwife | Admitting: Certified Nurse Midwife

## 2015-11-26 VITALS — BP 109/66 | HR 98 | Wt 161.0 lb

## 2015-11-26 DIAGNOSIS — Z36 Encounter for antenatal screening of mother: Secondary | ICD-10-CM | POA: Diagnosis not present

## 2015-11-26 DIAGNOSIS — Z3492 Encounter for supervision of normal pregnancy, unspecified, second trimester: Secondary | ICD-10-CM

## 2015-11-26 DIAGNOSIS — N760 Acute vaginitis: Secondary | ICD-10-CM

## 2015-11-26 DIAGNOSIS — B9689 Other specified bacterial agents as the cause of diseases classified elsewhere: Secondary | ICD-10-CM

## 2015-11-26 DIAGNOSIS — O30042 Twin pregnancy, dichorionic/diamniotic, second trimester: Secondary | ICD-10-CM | POA: Insufficient documentation

## 2015-11-26 DIAGNOSIS — Z3A19 19 weeks gestation of pregnancy: Secondary | ICD-10-CM | POA: Diagnosis not present

## 2015-11-26 DIAGNOSIS — A499 Bacterial infection, unspecified: Secondary | ICD-10-CM

## 2015-11-26 DIAGNOSIS — O30002 Twin pregnancy, unspecified number of placenta and unspecified number of amniotic sacs, second trimester: Secondary | ICD-10-CM

## 2015-11-26 LAB — POCT URINALYSIS DIPSTICK
Bilirubin, UA: NEGATIVE
Blood, UA: NEGATIVE
Glucose, UA: NEGATIVE
KETONES UA: NEGATIVE
Nitrite, UA: NEGATIVE
PH UA: 7
PROTEIN UA: NEGATIVE
SPEC GRAV UA: 1.01
UROBILINOGEN UA: NEGATIVE

## 2015-11-26 MED ORDER — METRONIDAZOLE 500 MG PO TABS: 500.0000 mg | ORAL_TABLET | Freq: Two times a day (BID) | ORAL | Status: DC

## 2015-11-26 NOTE — Patient Instructions (Signed)

## 2015-11-27 ENCOUNTER — Other Ambulatory Visit: Payer: Self-pay | Admitting: Certified Nurse Midwife

## 2015-11-29 ENCOUNTER — Encounter: Payer: Self-pay | Admitting: Obstetrics

## 2015-11-29 NOTE — Progress Notes (Signed)
Subjective:    Lucila MaineBridget A Daws is a 30 y.o. female being seen today for her obstetrical visit. She is at 674w1d gestation. Patient reports: no complaints . Fetal movement: normal.  Problem List Items Addressed This Visit    None    Visit Diagnoses    Prenatal care, second trimester    -  Primary    Relevant Orders    POCT urinalysis dipstick (Completed)    BV (bacterial vaginosis)        Relevant Medications    metroNIDAZOLE (FLAGYL) 500 MG tablet    Other Relevant Orders    NuSwab VG+, Candida 6sp (Completed)      Patient Active Problem List   Diagnosis Date Noted  . 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 Cannabis use disorder, severe, dependence (HCC) 04/15/2015  . PTSD (post-traumatic stress disorder) 04/15/2015  . Cocaine use disorder, moderate, in early remission 04/15/2015  . Psychosis 04/14/2015   Objective:    BP 109/66 mmHg  Pulse 98  Wt 161 lb (73.029 kg)  LMP 07/17/2015 FHT: 150 BPM  Uterine Size: size equals dates     Assessment:    Pregnancy @ 8974w1d    Plan:    Signs and symptoms of preterm labor: discussed.  Labs, problem list reviewed and updated 2 hr GTT planned Follow up in 4 weeks.

## 2015-11-30 LAB — NUSWAB VG+, CANDIDA 6SP
CANDIDA ALBICANS, NAA: NEGATIVE
CANDIDA GLABRATA, NAA: POSITIVE — AB
CANDIDA KRUSEI, NAA: NEGATIVE
CANDIDA LUSITANIAE, NAA: NEGATIVE
CANDIDA PARAPSILOSIS, NAA: NEGATIVE
Candida tropicalis, NAA: NEGATIVE
Chlamydia trachomatis, NAA: NEGATIVE
NEISSERIA GONORRHOEAE, NAA: NEGATIVE
Trich vag by NAA: NEGATIVE

## 2015-12-06 ENCOUNTER — Other Ambulatory Visit: Payer: Self-pay | Admitting: Obstetrics

## 2015-12-06 DIAGNOSIS — B3731 Acute candidiasis of vulva and vagina: Secondary | ICD-10-CM

## 2015-12-06 DIAGNOSIS — B373 Candidiasis of vulva and vagina: Secondary | ICD-10-CM

## 2015-12-06 MED ORDER — TERCONAZOLE 0.4 % VA CREA: 1.0000 | TOPICAL_CREAM | Freq: Every day | VAGINAL | Status: DC

## 2015-12-07 ENCOUNTER — Other Ambulatory Visit: Payer: Self-pay | Admitting: Obstetrics

## 2015-12-08 ENCOUNTER — Encounter: Payer: Self-pay | Admitting: *Deleted

## 2015-12-24 ENCOUNTER — Ambulatory Visit (HOSPITAL_COMMUNITY): Payer: Medicaid Other

## 2015-12-24 ENCOUNTER — Encounter: Payer: Medicaid Other | Admitting: Certified Nurse Midwife

## 2015-12-24 ENCOUNTER — Inpatient Hospital Stay (HOSPITAL_COMMUNITY)
Admission: AD | Admit: 2015-12-24 | Discharge: 2015-12-24 | Disposition: A | Discharge status: Home / Self Care | Payer: Medicaid Other | Source: Ambulatory Visit | Attending: Obstetrics & Gynecology | Admitting: Obstetrics & Gynecology

## 2015-12-24 ENCOUNTER — Encounter (HOSPITAL_COMMUNITY): Payer: Self-pay | Admitting: *Deleted

## 2015-12-24 DIAGNOSIS — O99612 Diseases of the digestive system complicating pregnancy, second trimester: Secondary | ICD-10-CM | POA: Insufficient documentation

## 2015-12-24 DIAGNOSIS — Z3A23 23 weeks gestation of pregnancy: Secondary | ICD-10-CM | POA: Diagnosis not present

## 2015-12-24 DIAGNOSIS — K5900 Constipation, unspecified: Secondary | ICD-10-CM | POA: Insufficient documentation

## 2015-12-24 DIAGNOSIS — O30042 Twin pregnancy, dichorionic/diamniotic, second trimester: Secondary | ICD-10-CM | POA: Diagnosis not present

## 2015-12-24 DIAGNOSIS — Z87891 Personal history of nicotine dependence: Secondary | ICD-10-CM | POA: Insufficient documentation

## 2015-12-24 DIAGNOSIS — O26892 Other specified pregnancy related conditions, second trimester: Secondary | ICD-10-CM | POA: Diagnosis present

## 2015-12-24 DIAGNOSIS — N949 Unspecified condition associated with female genital organs and menstrual cycle: Secondary | ICD-10-CM | POA: Diagnosis not present

## 2015-12-24 DIAGNOSIS — O9989 Other specified diseases and conditions complicating pregnancy, childbirth and the puerperium: Secondary | ICD-10-CM | POA: Diagnosis not present

## 2015-12-24 DIAGNOSIS — R102 Pelvic and perineal pain: Secondary | ICD-10-CM | POA: Diagnosis not present

## 2015-12-24 DIAGNOSIS — O30049 Twin pregnancy, dichorionic/diamniotic, unspecified trimester: Secondary | ICD-10-CM

## 2015-12-24 LAB — URINALYSIS, ROUTINE W REFLEX MICROSCOPIC
Bilirubin Urine: NEGATIVE
GLUCOSE, UA: NEGATIVE mg/dL
Hgb urine dipstick: NEGATIVE
Ketones, ur: NEGATIVE mg/dL
LEUKOCYTES UA: NEGATIVE
Nitrite: NEGATIVE
PH: 6 (ref 5.0–8.0)
Protein, ur: NEGATIVE mg/dL
SPECIFIC GRAVITY, URINE: 1.025 (ref 1.005–1.030)

## 2015-12-24 NOTE — MAU Note (Signed)
Patient presents to mau with c/o lower abdominal pain that is sharp when it happens; states pain started after transferring a watermelon from one counter to the other. Endorse vaginal pressure that began at that same time. +fm

## 2015-12-24 NOTE — Discharge Instructions (Signed)
Round Ligament Pain  The round ligament is a cord of muscle and tissue that helps to support the uterus. It can become a source of pain during pregnancy if it becomes stretched or twisted as the baby grows. The pain usually begins in the second trimester of pregnancy, and it can come and go until the baby is delivered. It is not a serious problem, and it does not cause harm to the baby.  Round ligament pain is usually a short, sharp, and pinching pain, but it can also be a dull, lingering, and aching pain. The pain is felt in the lower side of the abdomen or in the groin. It usually starts deep in the groin and moves up to the outside of the hip area. Pain can occur with:   A sudden change in position.   Rolling over in bed.   Coughing or sneezing.   Physical activity.  HOME CARE INSTRUCTIONS  Watch your condition for any changes. Take these steps to help with your pain:   When the pain starts, relax. Then try:    Sitting down.    Flexing your knees up to your abdomen.    Lying on your side with one pillow under your abdomen and another pillow between your legs.    Sitting in a warm bath for 15-20 minutes or until the pain goes away.   Take over-the-counter and prescription medicines only as told by your health care provider.   Move slowly when you sit and stand.   Avoid long walks if they cause pain.   Stop or lessen your physical activities if they cause pain.  SEEK MEDICAL CARE IF:   Your pain does not go away with treatment.   You feel pain in your back that you did not have before.   Your medicine is not helping.  SEEK IMMEDIATE MEDICAL CARE IF:   You develop a fever or chills.   You develop uterine contractions.   You develop vaginal bleeding.   You develop nausea or vomiting.   You develop diarrhea.   You have pain when you urinate.     This information is not intended to replace advice given to you by your health care provider. Make sure you discuss any questions you have with your health  care provider.     Document Released: 03/14/2008 Document Revised: 08/28/2011 Document Reviewed: 08/12/2014  Elsevier Interactive Patient Education 2016 [employer]

## 2015-12-24 NOTE — MAU Provider Note (Signed)
History     CSN: [id number]  Arrival date and time: 12/24/15 1849   None     Chief Complaint  Patient presents with  . vaginal pressure    HPI  Debra Sullivan is a 30 y.o., 3864w5d, [phone] pregnant with didi twins who presents today for pelvic pressure. She reports the pressure started 1 hour ago when she lifted a large watermelon off the counter. The pressure is in her pelvis and is constant, and she feels like she needs to bend over slightly to walk. She denies contractions, vaginal bleeding or leaking fluid, dysuria or increased urgency, or back pain. She reports good fetal movement. She endorses constipation, feeling dehydrated, and previous issues with sciatic nerve and round ligament pain.   OB History    Gravida Para Term Preterm AB TAB SAB Ectopic Multiple Living   5 3 3  1  1   3       Past Medical History  Diagnosis Date  . Medical history non-contributory   . Psychosis 2013  . Drug abuse     Past Surgical History  Procedure Laterality Date  . No past surgeries      Family History  Problem Relation Age of Onset  . Diabetes Mother   . Schizophrenia Mother   . Diabetes Brother   . Hypertension Maternal Aunt     Social History  Substance Use Topics  . Smoking status: Former Smoker -- 0.25 packs/day    Types: Cigarettes    Quit date: 08/06/2015  . Smokeless tobacco: Never Used  . Alcohol Use: No    Allergies: No Known Allergies  Prescriptions prior to admission  Medication Sig Dispense Refill Last Dose  . butalbital-acetaminophen-caffeine (FIORICET) 50-325-40 MG tablet Take 1-2 tablets by mouth every 6 (six) hours as needed for headache. 45 tablet 5 Past Month at Unknown time  . Prenatal Vit-Fe Phos-FA-Omega (VITAFOL GUMMIES) 3.33-0.333-34.8 MG CHEW Chew 3 tablets by mouth daily. 90 tablet 12 12/24/2015 at Unknown time  . metroNIDAZOLE (FLAGYL) 500 MG tablet Take 1 tablet (500 mg total) by mouth 2 (two) times daily. (Patient not taking: Reported on  12/24/2015) 14 tablet 2   . terconazole (TERAZOL 7) 0.4 % vaginal cream Place 1 applicator vaginally at bedtime. (Patient not taking: Reported on 12/24/2015) 45 g 0     Review of Systems  Constitutional: Negative for fever.  Gastrointestinal: Positive for constipation. Negative for nausea, vomiting and diarrhea.  Genitourinary: Negative for dysuria and urgency.       Negative for vaginal bleeding or leaking fluid.    Musculoskeletal: Negative for back pain.  Neurological: Negative for headaches.   Physical Exam   Blood pressure 111/72, pulse 110, temperature 98.6 F (37 C), temperature source Oral, resp. rate 16, height 5\' 5"  (1.651 m), weight 162 lb (73.483 kg), last menstrual period 07/17/2015, SpO2 100 %.  Physical Exam  Constitutional: She is oriented to person, place, and time. She appears well-developed and well-nourished. No distress.  HENT:  Head: Normocephalic and atraumatic.  Neck: Normal range of motion. Neck supple.  Respiratory: Effort normal.  GI: Soft. There is no tenderness.  Genitourinary:  Cervix was closed and thick.  Musculoskeletal: Normal range of motion.  Neurological: She is alert and oriented to person, place, and time.  Skin: Skin is warm and dry.  Psychiatric: She has a normal mood and affect. Her behavior is normal.   EFM Baby A: 150 bpm, moderate variability, + accels, no decels EFM Baby B: 150  bpm, moderate variability, no accels, no decels Toco: none  MAU Course  Procedures Results for orders placed or performed during the hospital encounter of 12/24/15 (from the past 24 hour(s))  Urinalysis, Routine w reflex microscopic (not at [facility])     Status: None   Collection Time: 12/24/15  8:09 PM  Result Value Ref Range   Color, Urine YELLOW YELLOW   APPearance CLEAR CLEAR   Specific Gravity, Urine 1.025 1.005 - 1.030   pH 6.0 5.0 - 8.0   Glucose, UA NEGATIVE NEGATIVE mg/dL   Hgb urine dipstick NEGATIVE NEGATIVE   Bilirubin Urine NEGATIVE NEGATIVE    Ketones, ur NEGATIVE NEGATIVE mg/dL   Protein, ur NEGATIVE NEGATIVE mg/dL   Nitrite NEGATIVE NEGATIVE   Leukocytes, UA NEGATIVE NEGATIVE    MDM Labs ordered and reviewed. No evidence of UTI or PTL. Stable for discharge home  Assessment and Plan   1. Dichorionic diamniotic twin pregnancy   2. Round ligament pain    Discharge home Discussed weight restrictions Consider maternity support belt Increase water intake Follow up as scheduled next week in office   Donette LarryMelanie Bartt [place]aga, CNM 12/24/2015, 8:07 PM

## 2015-12-28 ENCOUNTER — Ambulatory Visit (INDEPENDENT_AMBULATORY_CARE_PROVIDER_SITE_OTHER): Payer: Medicaid Other | Admitting: Certified Nurse Midwife

## 2015-12-28 VITALS — BP 113/76 | HR 99 | Temp 98.3°F | Wt 178.8 lb

## 2015-12-28 DIAGNOSIS — Z3492 Encounter for supervision of normal pregnancy, unspecified, second trimester: Secondary | ICD-10-CM

## 2015-12-28 LAB — POCT URINALYSIS DIPSTICK
BILIRUBIN UA: NEGATIVE
Blood, UA: NEGATIVE
Glucose, UA: NEGATIVE
KETONES UA: NEGATIVE
LEUKOCYTES UA: NEGATIVE
Nitrite, UA: NEGATIVE
SPEC GRAV UA: 1.015
Urobilinogen, UA: 1
pH, UA: 8

## 2015-12-28 NOTE — Progress Notes (Signed)
I agree with note by NP Student Andrew Brake.  Was present for exam.  R.Aqsa Sensabaugh CNM 

## 2015-12-28 NOTE — Progress Notes (Signed)
Subjective:    Debra Sullivan is being seen today for her obstetrical visit.  She is at 2884w2d gestation.  Patient reports backache, heartburn, no bleeding, no contractions, no cramping and no leaking.   Fetal Movement: normal.   Requesting STD screening, had unprotected sex a couple of weeks ago (on and off relationship with FOB).    Problem List Items Addressed This Visit    None    Visit Diagnoses    Prenatal care, second trimester    -  Primary    Relevant Orders    POCT urinalysis dipstick (Completed)    NuSwab VG+, Candida 6sp      Patient Active Problem List   Diagnosis Date Noted  . 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 Cannabis use disorder, severe, dependence (HCC) 04/15/2015  . PTSD (post-traumatic stress disorder) 04/15/2015  . Cocaine use disorder, moderate, in early remission 04/15/2015  . Psychosis 04/14/2015   Objective:    BP 113/76 mmHg  Pulse 99  Temp(Src) 98.3 F (36.8 C)  Wt 178 lb 12.8 oz (81.103 kg)  LMP 07/17/2015 FHT:  Baby A: 148 BPM;  Baby B:  126 BPM  Uterine Size: 26 cm     Assessment:    Pregnancy @ 1984w2d weeks Twins, dichorionic, diamniotic.  Back pain. STD screening.  Plan:  F/U on 12-29-2015 with MFM.  Ultrasound scheduled.    RX for maternity support belt. OBGCT: ordered for next visit. Signs and symptoms of preterm labor: discussed.. Signs of premature labor and dilation were reviewed.   Discussed fetal positions and related modes of delivery.  Consent form for twins provided. Follow up: 3 weeks with GTT.

## 2015-12-28 NOTE — Progress Notes (Signed)
Patient request STD/yeast check.

## 2015-12-29 ENCOUNTER — Encounter (HOSPITAL_COMMUNITY): Payer: Self-pay

## 2015-12-29 ENCOUNTER — Ambulatory Visit (HOSPITAL_COMMUNITY)
Admission: RE | Admit: 2015-12-29 | Discharge: 2015-12-29 | Disposition: A | Discharge status: Home / Self Care | Payer: Medicaid Other | Source: Ambulatory Visit | Attending: Certified Nurse Midwife | Admitting: Certified Nurse Midwife

## 2015-12-29 VITALS — BP 123/63 | HR 87 | Wt 179.4 lb

## 2015-12-29 DIAGNOSIS — O09892 Supervision of other high risk pregnancies, second trimester: Secondary | ICD-10-CM | POA: Diagnosis not present

## 2015-12-29 DIAGNOSIS — O99322 Drug use complicating pregnancy, second trimester: Secondary | ICD-10-CM | POA: Diagnosis not present

## 2015-12-29 DIAGNOSIS — F1990 Other psychoactive substance use, unspecified, uncomplicated: Secondary | ICD-10-CM | POA: Diagnosis not present

## 2015-12-29 DIAGNOSIS — Z36 Encounter for antenatal screening of mother: Secondary | ICD-10-CM | POA: Insufficient documentation

## 2015-12-29 DIAGNOSIS — O30042 Twin pregnancy, dichorionic/diamniotic, second trimester: Secondary | ICD-10-CM | POA: Diagnosis present

## 2015-12-29 DIAGNOSIS — O30049 Twin pregnancy, dichorionic/diamniotic, unspecified trimester: Secondary | ICD-10-CM

## 2015-12-29 DIAGNOSIS — Z3A24 24 weeks gestation of pregnancy: Secondary | ICD-10-CM | POA: Insufficient documentation

## 2015-12-29 DIAGNOSIS — O30002 Twin pregnancy, unspecified number of placenta and unspecified number of amniotic sacs, second trimester: Secondary | ICD-10-CM

## 2016-01-01 LAB — NUSWAB VG+, CANDIDA 6SP
BVAB 2: HIGH Score — AB
CANDIDA ALBICANS, NAA: NEGATIVE
CANDIDA KRUSEI, NAA: NEGATIVE
CANDIDA LUSITANIAE, NAA: NEGATIVE
CANDIDA PARAPSILOSIS, NAA: NEGATIVE
CANDIDA TROPICALIS, NAA: NEGATIVE
Candida glabrata, NAA: NEGATIVE
Chlamydia trachomatis, NAA: NEGATIVE
Neisseria gonorrhoeae, NAA: NEGATIVE
Trich vag by NAA: NEGATIVE

## 2016-01-04 ENCOUNTER — Other Ambulatory Visit: Payer: Self-pay | Admitting: Certified Nurse Midwife

## 2016-01-04 DIAGNOSIS — B9689 Other specified bacterial agents as the cause of diseases classified elsewhere: Secondary | ICD-10-CM

## 2016-01-04 DIAGNOSIS — N760 Acute vaginitis: Principal | ICD-10-CM

## 2016-01-04 MED ORDER — METRONIDAZOLE 500 MG PO TABS: 500.0000 mg | ORAL_TABLET | Freq: Two times a day (BID) | ORAL | Status: DC

## 2016-01-06 ENCOUNTER — Telehealth: Payer: Self-pay | Admitting: *Deleted

## 2016-01-06 NOTE — Telephone Encounter (Signed)
See phone note for this encounter. 

## 2016-01-20 ENCOUNTER — Ambulatory Visit (INDEPENDENT_AMBULATORY_CARE_PROVIDER_SITE_OTHER): Payer: Medicaid Other | Admitting: Obstetrics

## 2016-01-20 ENCOUNTER — Other Ambulatory Visit: Payer: Medicaid Other

## 2016-01-20 VITALS — BP 124/69 | HR 99 | Temp 98.8°F | Wt 186.0 lb

## 2016-01-20 DIAGNOSIS — Z3493 Encounter for supervision of normal pregnancy, unspecified, third trimester: Secondary | ICD-10-CM

## 2016-01-20 DIAGNOSIS — Z3492 Encounter for supervision of normal pregnancy, unspecified, second trimester: Secondary | ICD-10-CM

## 2016-01-20 LAB — POCT URINALYSIS DIPSTICK
BILIRUBIN UA: NEGATIVE
Glucose, UA: NEGATIVE
KETONES UA: NEGATIVE
Nitrite, UA: NEGATIVE
PH UA: 8
RBC UA: NEGATIVE
Urobilinogen, UA: NEGATIVE

## 2016-01-20 NOTE — Progress Notes (Signed)
Patient has frequency- she urinates all the time.

## 2016-01-26 ENCOUNTER — Encounter (HOSPITAL_COMMUNITY): Payer: Self-pay

## 2016-01-26 ENCOUNTER — Ambulatory Visit (HOSPITAL_COMMUNITY)
Admission: RE | Admit: 2016-01-26 | Discharge: 2016-01-26 | Disposition: A | Discharge status: Home / Self Care | Payer: Medicaid Other | Source: Ambulatory Visit | Attending: Certified Nurse Midwife | Admitting: Certified Nurse Midwife

## 2016-01-26 DIAGNOSIS — O30042 Twin pregnancy, dichorionic/diamniotic, second trimester: Secondary | ICD-10-CM | POA: Insufficient documentation

## 2016-01-26 DIAGNOSIS — O30049 Twin pregnancy, dichorionic/diamniotic, unspecified trimester: Secondary | ICD-10-CM

## 2016-01-26 DIAGNOSIS — Z3A28 28 weeks gestation of pregnancy: Secondary | ICD-10-CM | POA: Insufficient documentation

## 2016-01-26 NOTE — Progress Notes (Signed)
Patient ID: Debra Sullivan, female   DOB: 03/31/1986, 30 y.o.   MRN: [id number] Subjective:    Debra Sullivan is a 30 y.o. female being seen today for her obstetrical visit. She is at 8257w3d gestation. Patient reports no complaints. Fetal movement: normal.  Problem List Items Addressed This Visit    None    Visit Diagnoses    Prenatal care, second trimester    -  Primary   Relevant Orders   POCT urinalysis dipstick (Completed)     Patient Active Problem List   Diagnosis Date Noted  . 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 Cannabis use disorder, severe, dependence (HCC) 04/15/2015  . PTSD (post-traumatic stress disorder) 04/15/2015  . Cocaine use disorder, moderate, in early remission 04/15/2015  . Psychosis 04/14/2015   Objective:    BP 124/69   Pulse 99   Temp 98.8 F (37.1 C)   Wt 186 lb (84.4 kg)   LMP 07/17/2015 (Approximate)   BMI 30.95 kg/m  FHT:  150 BPM  Uterine Size: size equals dates  Presentation: unsure     Assessment:    Pregnancy @ 5657w3d weeks   Plan:     labs reviewed, problem list updated Consent signed. GBS sent TDAP offered  Rhogam given for RH negative Pediatrician: discussed. Infant feeding: plans to breastfeed. Maternity leave: discussed.  Orders Placed This Encounter  Procedures  . POCT urinalysis dipstick   No orders of the defined types were placed in this encounter.  Follow up in 2 Weeks.

## 2016-01-27 ENCOUNTER — Other Ambulatory Visit (HOSPITAL_COMMUNITY): Payer: Self-pay | Admitting: *Deleted

## 2016-01-27 DIAGNOSIS — O30049 Twin pregnancy, dichorionic/diamniotic, unspecified trimester: Secondary | ICD-10-CM

## 2016-02-03 ENCOUNTER — Ambulatory Visit (INDEPENDENT_AMBULATORY_CARE_PROVIDER_SITE_OTHER): Payer: Medicaid Other | Admitting: Certified Nurse Midwife

## 2016-02-03 VITALS — BP 104/63 | HR 93 | Wt 185.0 lb

## 2016-02-03 DIAGNOSIS — Z3483 Encounter for supervision of other normal pregnancy, third trimester: Secondary | ICD-10-CM

## 2016-02-03 DIAGNOSIS — O0993 Supervision of high risk pregnancy, unspecified, third trimester: Secondary | ICD-10-CM

## 2016-02-03 DIAGNOSIS — O0990 Supervision of high risk pregnancy, unspecified, unspecified trimester: Secondary | ICD-10-CM | POA: Insufficient documentation

## 2016-02-03 LAB — POCT URINALYSIS DIPSTICK
BILIRUBIN UA: NEGATIVE
Blood, UA: NEGATIVE
GLUCOSE UA: NEGATIVE
Ketones, UA: NEGATIVE
NITRITE UA: NEGATIVE
Protein, UA: NEGATIVE
Spec Grav, UA: <=1.005
Urobilinogen, UA: NEGATIVE
pH, UA: 7.5

## 2016-02-03 NOTE — Progress Notes (Signed)
Subjective:    Debra Sullivan is being seen today for her obstetrical visit.  She is at 4248w4d gestation.  Patient reports no complaints.   Fetal Movement: normal.   Problem List Items Addressed This Visit      Other   Supervision of high risk pregnancy, antepartum    Other Visit Diagnoses    Encounter for supervision of other normal pregnancy in third trimester    -  Primary   Relevant Orders   POCT urinalysis dipstick (Completed)   Supervision of high-risk pregnancy, third trimester       Relevant Orders   Hemoglobin A1c     Patient Active Problem List   Diagnosis Date Noted  . Supervision of high risk pregnancy, antepartum 02/03/2016  . 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 Cannabis use disorder, severe, dependence (HCC) 04/15/2015  . PTSD (post-traumatic stress disorder) 04/15/2015  . Cocaine use disorder, moderate, in early remission 04/15/2015  . Psychosis 04/14/2015   Objective:    BP 104/63   Pulse 93   Wt 185 lb (83.9 kg)   LMP 07/17/2015 (Approximate)   BMI 30.79 kg/m  FHT:  Baby A: 127 BPM;  Baby B:  138 BPM  Uterine Size: 32 cm and consistent with twins     Assessment:    Pregnancy @ 5848w4d weeks Twins, dichorionic, diamniotic.   Plan:   2 hour OGTT in the AM  Ultrasound scheduled.    OBGCT: discussed and ordered.. Pediatric planning: choices discussed. Infant feeding: plans to breastfeed, plans to bottle feed.. Signs of premature labor and dilation were reviewed.   Discussed fetal positions and related modes of delivery.  Consent form for twins provided. Follow up: 2 weeks.

## 2016-02-07 ENCOUNTER — Other Ambulatory Visit: Payer: Self-pay

## 2016-02-08 ENCOUNTER — Other Ambulatory Visit: Payer: Self-pay

## 2016-02-14 ENCOUNTER — Ambulatory Visit (INDEPENDENT_AMBULATORY_CARE_PROVIDER_SITE_OTHER): Payer: Medicaid Other | Admitting: Obstetrics & Gynecology

## 2016-02-14 ENCOUNTER — Other Ambulatory Visit: Payer: Medicaid Other

## 2016-02-14 VITALS — BP 98/67 | HR 105 | Temp 98.1°F | Wt 189.2 lb

## 2016-02-14 DIAGNOSIS — O0993 Supervision of high risk pregnancy, unspecified, third trimester: Secondary | ICD-10-CM

## 2016-02-14 DIAGNOSIS — O30049 Twin pregnancy, dichorionic/diamniotic, unspecified trimester: Secondary | ICD-10-CM | POA: Insufficient documentation

## 2016-02-14 DIAGNOSIS — Z3493 Encounter for supervision of normal pregnancy, unspecified, third trimester: Secondary | ICD-10-CM

## 2016-02-14 DIAGNOSIS — O30043 Twin pregnancy, dichorionic/diamniotic, third trimester: Secondary | ICD-10-CM

## 2016-02-14 LAB — POCT URINALYSIS DIPSTICK
Bilirubin, UA: NEGATIVE
GLUCOSE UA: NEGATIVE
Ketones, UA: NEGATIVE
NITRITE UA: NEGATIVE
PROTEIN UA: NEGATIVE
RBC UA: NEGATIVE
Spec Grav, UA: <=1.005
UROBILINOGEN UA: 0.2
pH, UA: 7

## 2016-02-15 LAB — CBC
HEMATOCRIT: 30.6 % — AB (ref 34.0–46.6)
HEMOGLOBIN: 9.7 g/dL — AB (ref 11.1–15.9)
MCH: 23.1 pg — ABNORMAL LOW (ref 26.6–33.0)
MCHC: 31.7 g/dL (ref 31.5–35.7)
MCV: 73 fL — AB (ref 79–97)
Platelets: 353 10*3/uL (ref 150–379)
RBC: 4.2 x10E6/uL (ref 3.77–5.28)
RDW: 14.8 % (ref 12.3–15.4)
WBC: 9.3 10*3/uL (ref 3.4–10.8)

## 2016-02-15 LAB — GLUCOSE TOLERANCE, 2 HOURS W/ 1HR
GLUCOSE, 2 HOUR: 86 mg/dL (ref 65–152)
Glucose, 1 hour: 115 mg/dL (ref 65–179)
Glucose, Fasting: 71 mg/dL (ref 65–91)

## 2016-02-15 LAB — HIV ANTIBODY (ROUTINE TESTING W REFLEX): HIV Screen 4th Generation wRfx: NONREACTIVE

## 2016-02-15 LAB — RPR: RPR: NONREACTIVE

## 2016-02-16 LAB — URINE CULTURE, OB REFLEX

## 2016-02-16 LAB — CULTURE, OB URINE

## 2016-02-18 ENCOUNTER — Encounter: Payer: Self-pay | Admitting: Certified Nurse Midwife

## 2016-02-23 ENCOUNTER — Encounter (HOSPITAL_COMMUNITY): Payer: Self-pay

## 2016-02-23 ENCOUNTER — Other Ambulatory Visit (HOSPITAL_COMMUNITY): Payer: Self-pay | Admitting: *Deleted

## 2016-02-23 ENCOUNTER — Ambulatory Visit (INDEPENDENT_AMBULATORY_CARE_PROVIDER_SITE_OTHER): Payer: Medicaid Other | Admitting: Obstetrics and Gynecology

## 2016-02-23 ENCOUNTER — Ambulatory Visit (HOSPITAL_COMMUNITY)
Admission: RE | Admit: 2016-02-23 | Discharge: 2016-02-23 | Disposition: A | Discharge status: Home / Self Care | Payer: Medicaid Other | Source: Ambulatory Visit | Attending: Certified Nurse Midwife | Admitting: Certified Nurse Midwife

## 2016-02-23 VITALS — BP 105/70 | HR 103 | Temp 98.4°F | Wt 197.8 lb

## 2016-02-23 DIAGNOSIS — F1990 Other psychoactive substance use, unspecified, uncomplicated: Secondary | ICD-10-CM | POA: Insufficient documentation

## 2016-02-23 DIAGNOSIS — O30042 Twin pregnancy, dichorionic/diamniotic, second trimester: Secondary | ICD-10-CM | POA: Insufficient documentation

## 2016-02-23 DIAGNOSIS — O30049 Twin pregnancy, dichorionic/diamniotic, unspecified trimester: Secondary | ICD-10-CM

## 2016-02-23 DIAGNOSIS — Z3A32 32 weeks gestation of pregnancy: Secondary | ICD-10-CM | POA: Diagnosis not present

## 2016-02-23 DIAGNOSIS — Z3493 Encounter for supervision of normal pregnancy, unspecified, third trimester: Secondary | ICD-10-CM

## 2016-02-23 DIAGNOSIS — O99323 Drug use complicating pregnancy, third trimester: Secondary | ICD-10-CM | POA: Insufficient documentation

## 2016-02-23 DIAGNOSIS — O30043 Twin pregnancy, dichorionic/diamniotic, third trimester: Secondary | ICD-10-CM

## 2016-02-23 DIAGNOSIS — F1421 Cocaine dependence, in remission: Secondary | ICD-10-CM

## 2016-02-23 DIAGNOSIS — O0993 Supervision of high risk pregnancy, unspecified, third trimester: Secondary | ICD-10-CM

## 2016-02-23 LAB — POCT URINALYSIS DIPSTICK
BILIRUBIN UA: NEGATIVE
Blood, UA: NEGATIVE
GLUCOSE UA: NEGATIVE
Nitrite, UA: NEGATIVE
Protein, UA: NEGATIVE
SPEC GRAV UA: 1.02
UROBILINOGEN UA: 0.2
pH, UA: 6

## 2016-02-23 NOTE — Progress Notes (Signed)
Subjective:  Debra Sullivan is a 30 y.o. [phone] at 4453w3d being seen today for ongoing prenatal care.  She is currently monitored for the following issues for this high-risk pregnancy and has Psychosis; MDD (major depressive disorder), single episode, severe with psychosis (HCC); Cannabis use disorder, severe, dependence (HCC); PTSD (post-traumatic stress disorder); Cocaine use disorder, moderate, in early remission; Supervision of high risk pregnancy, antepartum; and Twin gestation, dichorionic diamniotic on her problem list.  Patient reports no complaints.  Contractions: Not present. Vag. Bleeding: None.  Movement: Present. Denies leaking of fluid.   The following portions of the patient's history were reviewed and updated as appropriate: allergies, current medications, past family history, past medical history, past social history, past surgical history and problem list. Problem list updated.  Objective:   Vitals:   02/23/16 1504  BP: 105/70  Pulse: (!) 103  Temp: 98.4 F (36.9 C)  Weight: 197 lb 12.8 oz (89.7 kg)    Fetal Status:     Movement: Present     General:  Alert, oriented and cooperative. Patient is in no acute distress.  Skin: Skin is warm and dry. No rash noted.   Cardiovascular: Normal heart rate noted  Respiratory: Normal respiratory effort, no problems with respiration noted  Abdomen: Soft, gravid, appropriate for gestational age. Pain/Pressure: Absent     Pelvic:  Cervical exam deferred        Extremities: Normal range of motion.  Edema: None  Mental Status: Normal mood and affect. Normal behavior. Normal judgment and thought content.   Urinalysis:      Assessment and Plan:  Pregnancy: [id number] at 6553w3d  1. Prenatal care, third trimester  - POCT urinalysis dipstick  2. Dichorionic diamniotic twin pregnancy in third trimester U/S for growth completed today. Report pending  3. Supervision of high risk pregnancy, antepartum, third trimester   4. Cocaine  use disorder, moderate, in early remission   Preterm labor symptoms and general obstetric precautions including but not limited to vaginal bleeding, contractions, leaking of fluid and fetal movement were reviewed in detail with the patient. Please refer to After Visit Summary for other counseling recommendations.  Return in about 2 weeks (around 03/08/2016).   Hermina StaggersMichael L Eydie Wormley, MD

## 2016-03-09 ENCOUNTER — Encounter: Payer: Self-pay | Admitting: *Deleted

## 2016-03-09 ENCOUNTER — Ambulatory Visit (INDEPENDENT_AMBULATORY_CARE_PROVIDER_SITE_OTHER): Payer: Medicaid Other | Admitting: Obstetrics and Gynecology

## 2016-03-09 VITALS — BP 97/64 | HR 105 | Temp 98.0°F | Wt 196.4 lb

## 2016-03-09 DIAGNOSIS — O0993 Supervision of high risk pregnancy, unspecified, third trimester: Secondary | ICD-10-CM

## 2016-03-09 DIAGNOSIS — O30043 Twin pregnancy, dichorionic/diamniotic, third trimester: Secondary | ICD-10-CM | POA: Diagnosis not present

## 2016-03-09 NOTE — Progress Notes (Signed)
Patient stated that she has been feeling pressure that comes and goes and irregular contractions while she was rushing to get here today, patient reports no vaginal bleeding.

## 2016-03-09 NOTE — Progress Notes (Signed)
   PRENATAL VISIT NOTE  Subjective:  Debra Sullivan is a 30 y.o. [phone] at [redacted]w[redacted]d being seen today for ongoing prenatal care.  She is currently monitored for the following issues for this high-risk pregnancy and has Psychosis; MDD (major depressive disorder), single episode, severe with psychosis (HCC); Cannabis use disorder, severe, dependence (HCC); PTSD (post-traumatic stress disorder); Cocaine use disorder, moderate, in early remission; Supervision of high risk pregnancy, antepartum; and Twin gestation, dichorionic diamniotic on her problem list.  Patient reports backache.  Contractions: Irregular. Vag. Bleeding: None.  Movement: Present. Denies leaking of fluid.   The following portions of the patient's history were reviewed and updated as appropriate: allergies, current medications, past family history, past medical history, past social history, past surgical history and problem list. Problem list updated.  Objective:   Vitals:   03/09/16 1531  BP: 97/64  Pulse: (!) 105  Temp: 98 F (36.7 C)  Weight: 196 lb 6.4 oz (89.1 kg)    Fetal Status:     Movement: Present     General:  Alert, oriented and cooperative. Patient is in no acute distress.  Skin: Skin is warm and dry. No rash noted.   Cardiovascular: Normal heart rate noted  Respiratory: Normal respiratory effort, no problems with respiration noted  Abdomen: Soft, gravid, appropriate for gestational age. Pain/Pressure: Present     Pelvic:  Cervical exam deferred        Extremities: Normal range of motion.  Edema: None  Mental Status: Normal mood and affect. Normal behavior. Normal judgment and thought content.   Urinalysis:      Assessment and Plan:  Pregnancy: [id number] at [redacted]w[redacted]d  1. Dichorionic diamniotic twin pregnancy in third trimester 9/6 cephalic/cephalic with 1% discordant Patient is interested in primary cesarean section. Will decide after 10/4 ultrasound Cultures and NST next visit  2. Supervision of high  risk pregnancy, antepartum, third trimester Patient is otherwise doing well Declined flu and TDap vaccine Patient desires school note to complete classes from home as it is uncomfortable for her to ambulate  Preterm labor symptoms and general obstetric precautions including but not limited to vaginal bleeding, contractions, leaking of fluid and fetal movement were reviewed in detail with the patient. Please refer to After Visit Summary for other counseling recommendations.  Return in about 1 week (around 03/16/2016) for ROB/NST.  Catalina AntiguaPeggy Burnham Trost, MD

## 2016-03-15 ENCOUNTER — Ambulatory Visit (INDEPENDENT_AMBULATORY_CARE_PROVIDER_SITE_OTHER): Payer: Medicaid Other | Admitting: Certified Nurse Midwife

## 2016-03-15 VITALS — BP 114/69 | HR 100 | Temp 98.0°F | Wt 198.4 lb

## 2016-03-15 DIAGNOSIS — L739 Follicular disorder, unspecified: Secondary | ICD-10-CM

## 2016-03-15 DIAGNOSIS — O30043 Twin pregnancy, dichorionic/diamniotic, third trimester: Secondary | ICD-10-CM | POA: Diagnosis not present

## 2016-03-15 DIAGNOSIS — L731 Pseudofolliculitis barbae: Secondary | ICD-10-CM

## 2016-03-15 DIAGNOSIS — O0993 Supervision of high risk pregnancy, unspecified, third trimester: Secondary | ICD-10-CM

## 2016-03-15 MED ORDER — MUPIROCIN 2 % EX OINT
1.0000 | TOPICAL_OINTMENT | Freq: Two times a day (BID) | CUTANEOUS | 99 refills | Status: DC | PRN
Start: 2016-03-15 — End: 2017-04-15

## 2016-03-15 MED ORDER — HYDROCORTISONE 1 % EX CREA: TOPICAL_CREAM | CUTANEOUS | 1 refills | Status: AC

## 2016-03-15 NOTE — Progress Notes (Signed)
Patient would like some pain meds for wisdom tooth.

## 2016-03-15 NOTE — Progress Notes (Signed)
Subjective:    Debra Sullivan is being seen today for her obstetrical visit.  She is at 5245w3d gestation. Patient reports backache, no bleeding, no leaking and occasional contractions.   Fetal Movement: normal.   Problem List Items Addressed This Visit    None    Visit Diagnoses    Folliculitis of perineum    -  Primary   Relevant Medications   mupirocin ointment (BACTROBAN) 2 %   hydrocortisone cream 1 %     Patient Active Problem List   Diagnosis Date Noted  . Twin gestation, dichorionic diamniotic 02/14/2016  . Supervision of high risk pregnancy, antepartum 02/03/2016  . 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 Cannabis use disorder, severe, dependence (HCC) 04/15/2015  . PTSD (post-traumatic stress disorder) 04/15/2015  . Cocaine use disorder, moderate, in early remission 04/15/2015  . Psychosis 04/14/2015   Objective:    BP 114/69   Pulse 100   Temp 98 F (36.7 C)   Wt 198 lb 6.4 oz (90 kg)   LMP 07/17/2015 (Approximate)   BMI 33.02 kg/m  FHT:  Baby A: 145 BPM;  Baby B:  150 BPM  Uterine Size: consistent with twins  Presentations: Baby A: cephalic; Baby A: cephalic  Pelvic Exam:    Dilation: Closed   Effacement: 25%   Station:  -3   Consistency: medium   Position: posterior   labia: 2 small erythremic vesicles on labia majora.   NST: + accels, no decels, moderate variability, Cat. 1 tracing. No contractions on toco.  Assessment:    Pregnancy 35 and 3/7 weeks. Twins, dichorionic, diamniotic.   Folliculitis from shaving, no hx of herpes  Reactive NST  Plan:    Beta Strep Culture: done. Pediatrician discussed and selected. L&D discussion: symptoms of labor and discussed hospital routine.. Fetal surveillance for twins discussed.  Plan to initiate NSTs at 35 weeks. Ultrasound scheduled.  Fetal positions and related modes of delivery discussed. Follow-up: 1 Week.

## 2016-03-17 LAB — TOXASSURE SELECT 13 (MW), URINE

## 2016-03-17 LAB — STREP GP B NAA: STREP GROUP B AG: NEGATIVE

## 2016-03-18 LAB — NUSWAB VG+, CANDIDA 6SP
CANDIDA KRUSEI, NAA: NEGATIVE
CANDIDA TROPICALIS, NAA: NEGATIVE
Candida albicans, NAA: NEGATIVE
Candida glabrata, NAA: NEGATIVE
Candida lusitaniae, NAA: NEGATIVE
Candida parapsilosis, NAA: NEGATIVE
Chlamydia trachomatis, NAA: NEGATIVE
NEISSERIA GONORRHOEAE, NAA: NEGATIVE
TRICH VAG BY NAA: NEGATIVE

## 2016-03-21 ENCOUNTER — Other Ambulatory Visit: Payer: Self-pay | Admitting: Certified Nurse Midwife

## 2016-03-21 DIAGNOSIS — N760 Acute vaginitis: Principal | ICD-10-CM

## 2016-03-21 DIAGNOSIS — B9689 Other specified bacterial agents as the cause of diseases classified elsewhere: Secondary | ICD-10-CM

## 2016-03-21 MED ORDER — METRONIDAZOLE 500 MG PO TABS: 500.0000 mg | ORAL_TABLET | Freq: Two times a day (BID) | ORAL | 0 refills | Status: DC

## 2016-03-22 ENCOUNTER — Ambulatory Visit (HOSPITAL_COMMUNITY)
Admission: RE | Admit: 2016-03-22 | Discharge: 2016-03-22 | Disposition: A | Discharge status: Home / Self Care | Payer: Medicaid Other | Source: Ambulatory Visit | Attending: Certified Nurse Midwife | Admitting: Certified Nurse Midwife

## 2016-03-22 ENCOUNTER — Encounter (HOSPITAL_COMMUNITY): Payer: Self-pay | Admitting: *Deleted

## 2016-03-22 ENCOUNTER — Inpatient Hospital Stay (HOSPITAL_COMMUNITY)
Admission: AD | Admit: 2016-03-22 | Discharge: 2016-03-23 | Disposition: A | Discharge status: Other Acute Inpatient Hospital | Payer: Medicaid Other | Source: Ambulatory Visit | Attending: Obstetrics and Gynecology | Admitting: Obstetrics and Gynecology

## 2016-03-22 ENCOUNTER — Encounter (HOSPITAL_COMMUNITY): Payer: Self-pay

## 2016-03-22 ENCOUNTER — Other Ambulatory Visit (HOSPITAL_COMMUNITY): Payer: Self-pay | Admitting: Obstetrics and Gynecology

## 2016-03-22 DIAGNOSIS — Z833 Family history of diabetes mellitus: Secondary | ICD-10-CM | POA: Insufficient documentation

## 2016-03-22 DIAGNOSIS — Z3A36 36 weeks gestation of pregnancy: Secondary | ICD-10-CM | POA: Insufficient documentation

## 2016-03-22 DIAGNOSIS — O99343 Other mental disorders complicating pregnancy, third trimester: Secondary | ICD-10-CM | POA: Insufficient documentation

## 2016-03-22 DIAGNOSIS — O365932 Maternal care for other known or suspected poor fetal growth, third trimester, fetus 2: Secondary | ICD-10-CM | POA: Insufficient documentation

## 2016-03-22 DIAGNOSIS — O365931 Maternal care for other known or suspected poor fetal growth, third trimester, fetus 1: Secondary | ICD-10-CM | POA: Insufficient documentation

## 2016-03-22 DIAGNOSIS — O30049 Twin pregnancy, dichorionic/diamniotic, unspecified trimester: Secondary | ICD-10-CM

## 2016-03-22 DIAGNOSIS — F29 Unspecified psychosis not due to a substance or known physiological condition: Secondary | ICD-10-CM | POA: Diagnosis not present

## 2016-03-22 DIAGNOSIS — O30043 Twin pregnancy, dichorionic/diamniotic, third trimester: Secondary | ICD-10-CM | POA: Insufficient documentation

## 2016-03-22 DIAGNOSIS — F329 Major depressive disorder, single episode, unspecified: Secondary | ICD-10-CM | POA: Insufficient documentation

## 2016-03-22 DIAGNOSIS — O288 Other abnormal findings on antenatal screening of mother: Secondary | ICD-10-CM

## 2016-03-22 DIAGNOSIS — O09893 Supervision of other high risk pregnancies, third trimester: Secondary | ICD-10-CM | POA: Insufficient documentation

## 2016-03-22 DIAGNOSIS — O365910 Maternal care for other known or suspected poor fetal growth, first trimester, not applicable or unspecified: Secondary | ICD-10-CM | POA: Diagnosis present

## 2016-03-22 DIAGNOSIS — Z87891 Personal history of nicotine dependence: Secondary | ICD-10-CM | POA: Insufficient documentation

## 2016-03-22 DIAGNOSIS — O99323 Drug use complicating pregnancy, third trimester: Secondary | ICD-10-CM | POA: Insufficient documentation

## 2016-03-22 DIAGNOSIS — F1990 Other psychoactive substance use, unspecified, uncomplicated: Secondary | ICD-10-CM | POA: Insufficient documentation

## 2016-03-22 DIAGNOSIS — F4310 Post-traumatic stress disorder, unspecified: Secondary | ICD-10-CM | POA: Diagnosis not present

## 2016-03-22 DIAGNOSIS — Z818 Family history of other mental and behavioral disorders: Secondary | ICD-10-CM | POA: Insufficient documentation

## 2016-03-22 DIAGNOSIS — O365912 Maternal care for other known or suspected poor fetal growth, first trimester, fetus 2: Secondary | ICD-10-CM

## 2016-03-22 LAB — CBC
HEMATOCRIT: 27.9 % — AB (ref 36.0–46.0)
HEMOGLOBIN: 8.8 g/dL — AB (ref 12.0–15.0)
MCH: 22.2 pg — ABNORMAL LOW (ref 26.0–34.0)
MCHC: 31.5 g/dL (ref 30.0–36.0)
MCV: 70.3 fL — ABNORMAL LOW (ref 78.0–100.0)
Platelets: 301 10*3/uL (ref 150–400)
RBC: 3.97 MIL/uL (ref 3.87–5.11)
RDW: 15.8 % — ABNORMAL HIGH (ref 11.5–15.5)
WBC: 9.4 10*3/uL (ref 4.0–10.5)

## 2016-03-22 LAB — RAPID URINE DRUG SCREEN, HOSP PERFORMED
AMPHETAMINES: NOT DETECTED
BARBITURATES: NOT DETECTED
BENZODIAZEPINES: NOT DETECTED
Cocaine: NOT DETECTED
Opiates: NOT DETECTED
TETRAHYDROCANNABINOL: NOT DETECTED

## 2016-03-22 LAB — TYPE AND SCREEN
ABO/RH(D): A POS
ANTIBODY SCREEN: NEGATIVE

## 2016-03-22 MED ORDER — ACETAMINOPHEN 325 MG PO TABS
650.0000 mg | ORAL_TABLET | ORAL | Status: DC | PRN
  Administered 2016-03-22 – 2016-03-23 (×2): 650 mg via ORAL
  Filled 2016-03-22 (×2): qty 2

## 2016-03-22 NOTE — Progress Notes (Signed)
LABOR PROGRESS NOTE  Lucila MaineBridget A Mireles is a 30 y.o. [phone] at 1665w3d  admitted for IOL for IUGR in both di-di twins. Baby B with 6/8 BPP.  Subjective: Doing well. States her contractions are "bearable" right now. Wants to know if she can have a c-section because she thinks she'll be too tired to push the 2nd baby out after having the first.   Objective: BP (!) 117/50 (BP Location: Right Arm)   Pulse 93   Temp 97.9 F (36.6 C) (Oral)   Resp 16   LMP 07/17/2015 (Approximate)   SpO2 100%  or  Vitals:   03/22/16 1625 03/22/16 1755 03/22/16 1800  BP: (!) 117/50    Pulse: 107 90 93  Resp: 16    Temp: 97.9 F (36.6 C)    TempSrc: Oral    SpO2: 100% 100% 100%     Labs: Lab Results  Component Value Date   WBC 9.3 02/14/2016   HGB 11.8 (L) 09/03/2015   HCT 30.6 (L) 02/14/2016   MCV 73 (L) 02/14/2016   PLT 353 02/14/2016    Patient Active Problem List   Diagnosis Date Noted  . Twin gestation, dichorionic diamniotic 02/14/2016  . Supervision of high risk pregnancy, antepartum 02/03/2016  . 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 Cannabis use disorder, severe, dependence (HCC) 04/15/2015  . PTSD (post-traumatic stress disorder) 04/15/2015  . Cocaine use disorder, moderate, in early remission (HCC) 04/15/2015  . Psychosis 04/14/2015    Assessment / Plan: 30 y.o. [id number] at 5565w3d here for IOL for IUGR in both of her di-di twins. Baby B with 6/8 BPP.  Labor: Progressing spontaneously. Will continue to monitor and augment as appropriate. Fetal Wellbeing:  Category I Pain Control:  Well controlled now. Plan for epidural. Anticipated MOD:  SVD. No indication for C-section at this time. Babies are both vertex.  Hilton SinclairKaty D Mayo, MD 03/22/2016, 9:22 PM

## 2016-03-22 NOTE — H&P (Addendum)
LABOR AND DELIVERY ADMISSION HISTORY AND PHYSICAL NOTE  Debra Sullivan is a 30 y.o. female [phone] with IUP at [redacted]w[redacted]d by 11 week sonogram presenting for IOL due to IUGR dichorionic diamniotic twins.   Went to [place] today and was told to come in due to both babies appearing small and baby B with decreased breathing. She has had 3 other children all vaginal deliveries without complications.  Is feeling contractions since last night. She reports positive fetal movement. She denies leakage of fluid or vaginal bleeding.  Prenatal History/Complications:  Past Medical History: Past Medical History:  Diagnosis Date  . Drug abuse   . Medical history non-contributory   . Psychosis 2013    Past Surgical History: Past Surgical History:  Procedure Laterality Date  . NO PAST SURGERIES      Obstetrical History: OB History    Gravida Para Term Preterm AB Living   5 3 3   1 3    SAB TAB Ectopic Multiple Live Births   1       3      Social History: Social History   Social History  . Marital status: Single    Spouse name: N/A  . Number of children: N/A  . Years of education: N/A   Social History Main Topics  . Smoking status: Former Smoker    Packs/day: 0.25    Types: Cigarettes    Quit date: 08/06/2015  . Smokeless tobacco: Never Used  . Alcohol use No  . Drug use: No     Comment: not used in one month  . Sexual activity: Yes    Birth control/ protection: None   Other Topics Concern  . None   Social History Narrative  . None    Family History: Family History  Problem Relation Age of Onset  . Diabetes Mother   . Schizophrenia Mother   . Diabetes Brother   . Hypertension Maternal Aunt     Allergies: No Known Allergies  Prescriptions Prior to Admission  Medication Sig Dispense Refill Last Dose  . butalbital-acetaminophen-caffeine (FIORICET) 50-325-40 MG tablet Take 1-2 tablets by mouth every 6 (six) hours as needed for headache. 45 tablet 5 Taking  . hydrocortisone  cream 1 % Apply to affected area 2 times daily 30 g 1 Taking  . metroNIDAZOLE (FLAGYL) 500 MG tablet Take 1 tablet (500 mg total) by mouth 2 (two) times daily. 14 tablet 0 Taking  . mupirocin ointment (BACTROBAN) 2 % Apply 1 application topically 2 (two) times daily as needed. 30 g PRN Taking  . Prenatal Vit-Fe Phos-FA-Omega (VITAFOL GUMMIES) 3.33-0.333-34.8 MG CHEW Chew 3 tablets by mouth daily. 90 tablet 12 Taking     Review of Systems   All systems reviewed and negative except as stated in HPI  Blood pressure (!) 117/50, pulse 107, temperature 97.9 F (36.6 C), temperature source Oral, resp. rate 16, last menstrual period 07/17/2015, SpO2 100 %. General appearance: alert, cooperative and no distress Lungs: no respiratory distress Heart: regular rate Abdomen: soft, non-tender Extremities: No calf swelling or tenderness Presentation: both babies cephalic by [place] on 10/4 Fetal monitoring: category 1 for both babies Uterine activity: irregular contractions      Dilation 4 cm, thick, +2   Prenatal labs: ABO, Rh: A/Positive/-- (04/05 1045) Antibody: Negative (04/05 1045) Rubella: immune RPR: Non Reactive (08/28 1115)  HBsAg: Negative (04/05 1045)  HIV: Non Reactive (08/28 1115)  GBS: Negative (09/27 1501)   Prenatal Transfer Tool  Maternal Diabetes: No Genetic  Screening: Normal Maternal Ultrasounds/Referrals: Abnormal:  Findings:   IUGR Fetal Ultrasounds or other Referrals:  Referred to Materal Fetal Medicine  Maternal Substance Abuse:  Yes:  Type: Marijuana, Cocaine Significant Maternal Medications:  None Significant Maternal Lab Results: None  No results found for this or any previous visit (from the past 24 hour(s)).  Patient Active Problem List   Diagnosis Date Noted  . Twin gestation, dichorionic diamniotic 02/14/2016  . Supervision of high risk pregnancy, antepartum 02/03/2016  . MDD (major depressive disorder), single episode, severe with psychosis (HCC) 04/15/2015   . Cannabis use disorder, severe, dependence (HCC) 04/15/2015  . PTSD (post-traumatic stress disorder) 04/15/2015  . Cocaine use disorder, moderate, in early remission (HCC) 04/15/2015  . Psychosis 04/14/2015    Assessment: Debra Sullivan is a 30 y.o. [phone] at 4091w3d here for IOL in dichorionic diamniotic pregnancy both babies with IUGR on US 10/4. Both twins vertex.  #Labor: IOL for IUGR. 4 cm dilated - will start pitocin when arrives to the floor #Pain: Epidural if vaginal #FWB: Both babies category 1 #ID:  GBS neg #MOF: breast #MOC:depo #Circ:  outpatient circ desired, one baby is a boy #hx psychosis, mdd, ptsd: stable, not on meds. Has therapist who sees regularly. SW consult after delivery #hx cocaine, MJ: UDS  Debra SersAngela C Riccio, DO PGY-1 10/4/20174:43 PM  OB FELLOW HISTORY AND PHYSICAL ATTESTATION  I have seen and examined this patient; I agree with above documentation in the resident's note.    Cherrie Gauzeoah B Wouk 03/22/2016, 6:12 PM

## 2016-03-22 NOTE — Progress Notes (Signed)
Monitors adjusted multiple times. Pt non-compliant with monitors. Pt sitting straight up in bed and refuses to lie down.

## 2016-03-22 NOTE — MAU Note (Signed)
Direct admit from MFM for IOL for IUGR DI/DI twins.

## 2016-03-22 NOTE — MAU Note (Signed)
Urine to lab

## 2016-03-22 NOTE — Progress Notes (Signed)
RN in patient's room multiple times adjusting monitors. Pt stating she is going to leave and stating she doesn't want these monitors on. Pt removed monitors.

## 2016-03-23 ENCOUNTER — Inpatient Hospital Stay (HOSPITAL_COMMUNITY): Payer: Medicaid Other

## 2016-03-23 DIAGNOSIS — O99343 Other mental disorders complicating pregnancy, third trimester: Secondary | ICD-10-CM | POA: Diagnosis not present

## 2016-03-23 DIAGNOSIS — Z833 Family history of diabetes mellitus: Secondary | ICD-10-CM | POA: Diagnosis not present

## 2016-03-23 DIAGNOSIS — O365910 Maternal care for other known or suspected poor fetal growth, first trimester, not applicable or unspecified: Secondary | ICD-10-CM | POA: Diagnosis present

## 2016-03-23 DIAGNOSIS — F4310 Post-traumatic stress disorder, unspecified: Secondary | ICD-10-CM | POA: Diagnosis not present

## 2016-03-23 DIAGNOSIS — O365932 Maternal care for other known or suspected poor fetal growth, third trimester, fetus 2: Secondary | ICD-10-CM | POA: Diagnosis not present

## 2016-03-23 DIAGNOSIS — Z87891 Personal history of nicotine dependence: Secondary | ICD-10-CM | POA: Diagnosis not present

## 2016-03-23 DIAGNOSIS — F29 Unspecified psychosis not due to a substance or known physiological condition: Secondary | ICD-10-CM | POA: Diagnosis not present

## 2016-03-23 DIAGNOSIS — Z3A36 36 weeks gestation of pregnancy: Secondary | ICD-10-CM | POA: Diagnosis not present

## 2016-03-23 DIAGNOSIS — F329 Major depressive disorder, single episode, unspecified: Secondary | ICD-10-CM | POA: Diagnosis not present

## 2016-03-23 DIAGNOSIS — Z818 Family history of other mental and behavioral disorders: Secondary | ICD-10-CM | POA: Diagnosis not present

## 2016-03-23 DIAGNOSIS — O365931 Maternal care for other known or suspected poor fetal growth, third trimester, fetus 1: Secondary | ICD-10-CM | POA: Diagnosis not present

## 2016-03-23 DIAGNOSIS — O30043 Twin pregnancy, dichorionic/diamniotic, third trimester: Secondary | ICD-10-CM | POA: Diagnosis not present

## 2016-03-23 LAB — RPR: RPR Ser Ql: NONREACTIVE

## 2016-03-23 LAB — ABO/RH: ABO/RH(D): A POS

## 2016-03-23 MED ORDER — BETAMETHASONE SOD PHOS & ACET 6 (3-3) MG/ML IJ SUSP
12.0000 mg | Freq: Once | INTRAMUSCULAR | Status: AC
  Administered 2016-03-23: 12 mg via INTRAMUSCULAR
  Filled 2016-03-23: qty 2

## 2016-03-23 NOTE — Progress Notes (Signed)
Spoke with NICU on the phone about admission of this patient, since NICU is full. The neonatologist stated that she has concerns about the twins having temperature instability and blood sugar instability. She states that there are no NICU beds available here if the twins were to require transfer to the NICU.  Spoke with Dr. Emelda FearFerguson on the phone, who thinks that this patient should be transferred in the morning. Will put the twins back on the monitor. Will order a BPP on baby B (previous BPP was 6/8). Will also order Betamethasone because they are less than 37 weeks.  Willadean CarolKaty Mayo, MD PGY-2

## 2016-03-23 NOTE — Progress Notes (Signed)
Interval progress note: Patient remains in MAU, awaiting bed availability in L&D. The NICU attending has reviewed the case, and as the NICU is closed, they feel the likelihood of need for NICU care for these infants would necessitate transfer to another facility for delivery. BPP has been performed to update fetal status, and at this time BPP of each infant is 8/8. Betamethasone 12 mg IM has been administered for late preterm delivery. Nursery bed status has been explained to the patient, and [facility], Dr Verita LambAlkis, who has authorized acceptance of the patient in transfer tonight. Cervical exam has been repeated, and cervix is 3-4 cm / posterior / firm/ -2 with vertex well applied to cervix. [facility]. [facility] completed.

## 2016-03-24 ENCOUNTER — Encounter: Payer: Self-pay | Admitting: Certified Nurse Midwife

## 2016-03-31 ENCOUNTER — Ambulatory Visit: Payer: Self-pay | Admitting: Certified Nurse Midwife

## 2016-04-03 ENCOUNTER — Encounter: Payer: Self-pay | Admitting: *Deleted

## 2016-05-01 ENCOUNTER — Other Ambulatory Visit: Payer: Self-pay | Admitting: Certified Nurse Midwife

## 2016-05-25 ENCOUNTER — Encounter: Payer: Self-pay | Admitting: Certified Nurse Midwife

## 2016-05-25 ENCOUNTER — Other Ambulatory Visit: Payer: Self-pay | Admitting: Certified Nurse Midwife

## 2016-05-30 ENCOUNTER — Ambulatory Visit: Payer: Self-pay | Admitting: Family Medicine

## 2016-07-08 ENCOUNTER — Encounter (HOSPITAL_COMMUNITY): Payer: Self-pay

## 2016-08-09 ENCOUNTER — Telehealth: Payer: Self-pay

## 2016-08-09 DIAGNOSIS — Z789 Other specified health status: Secondary | ICD-10-CM

## 2016-08-09 MED ORDER — LEVONORGESTREL 0.75 MG PO TABS: 0.7500 mg | ORAL_TABLET | Freq: Two times a day (BID) | ORAL | 0 refills | Status: DC

## 2016-08-09 NOTE — Telephone Encounter (Signed)
Patient called in stating that she had intercourse last night and the condom broke. Patient says [facility] and is requesting plan b pill, sent rx per Dr. Debroah LoopArnold approval. Patient also [facility], transferred to scheduler.

## 2016-08-09 NOTE — Telephone Encounter (Signed)
Patient called in stating that the pharmacy said her rx was incorrect..contacted the pharmacy and they stated that patient did not say that she had a rx, she just walked up asking questions about the medication. Pharmacy staff stated that they would get rx ready for the patient.

## 2016-10-16 IMAGING — DX DG ABDOMEN 1V
1 series · 1 of 1 positions shown · non-contrast
Comparison: CT abdomen and pelvis February 17, 2015

CLINICAL DATA: Lower abdominal pain and nausea for 1 week.
Low-grade fever.

EXAM:
ABDOMEN - 1 VIEW

[t abdomen supine]
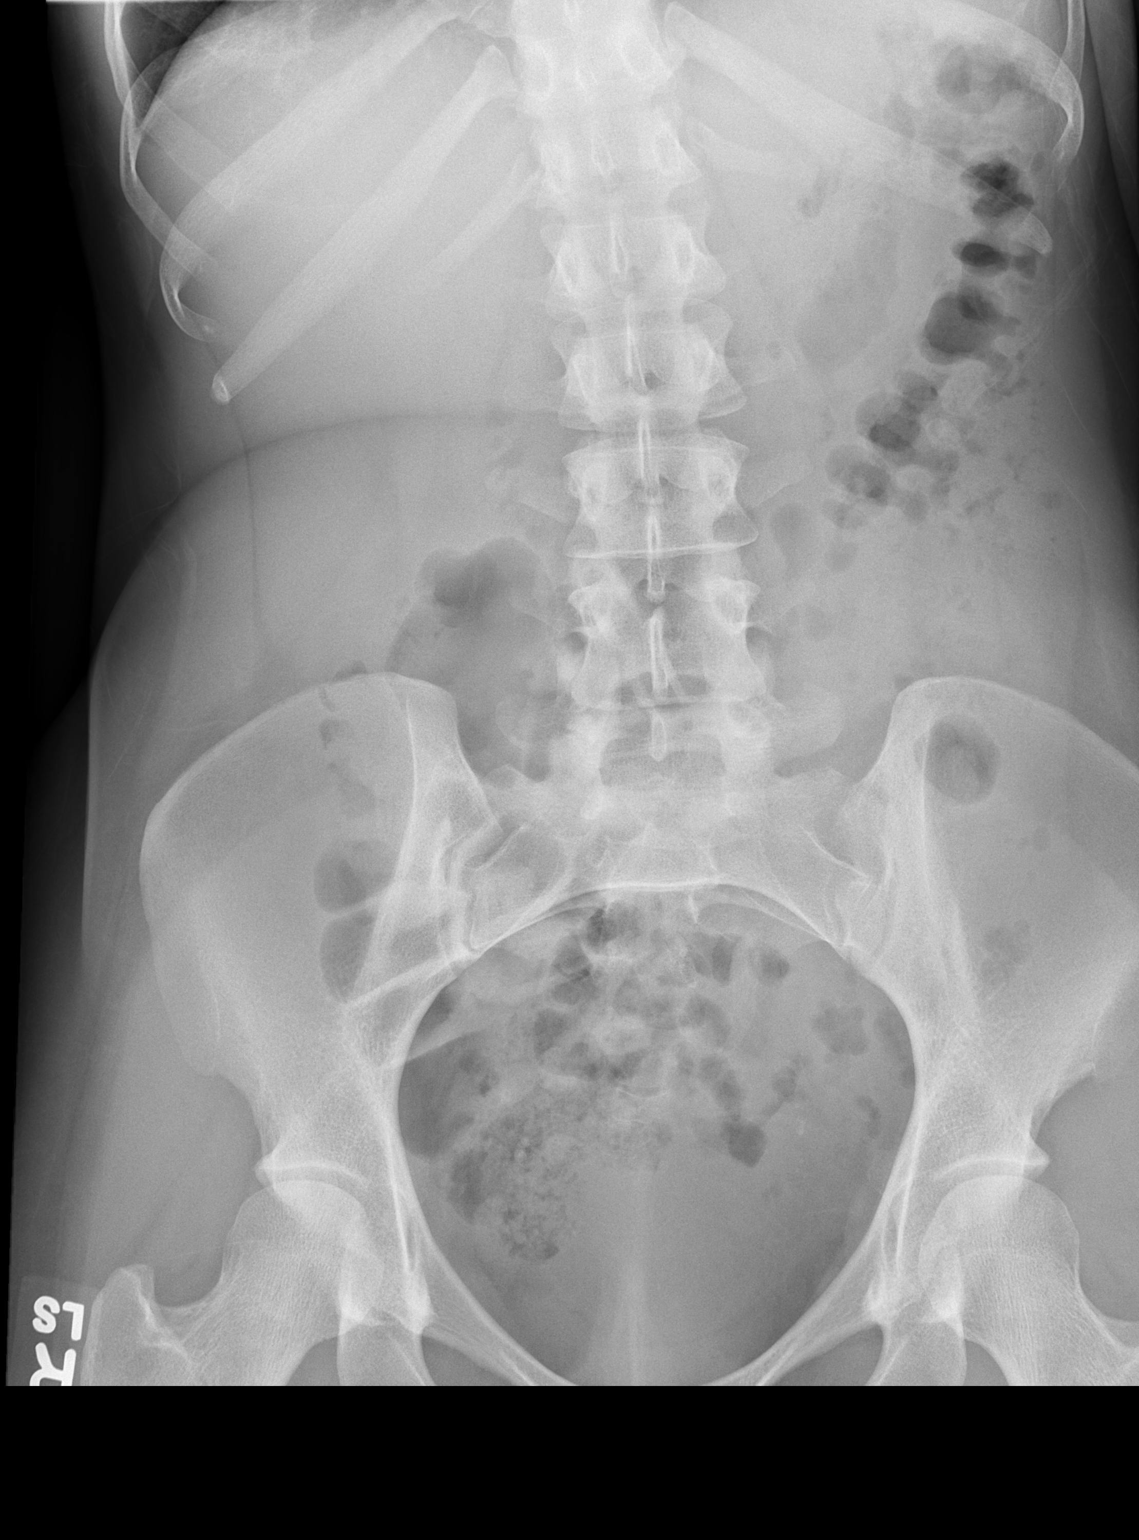

[1 of 1 positions shown; findings below may reference images not displayed]

FINDINGS: The bowel gas pattern is normal. No significant retained large bowel
stool. No radio-opaque calculi or other significant radiographic
abnormality are seen. Phleboliths in the pelvis.
IMPRESSION: Normal bowel gas pattern.

## 2016-12-13 ENCOUNTER — Emergency Department (HOSPITAL_COMMUNITY)
Admission: EM | Admit: 2016-12-13 | Discharge: 2016-12-13 | Disposition: A | Discharge status: Home / Self Care | Payer: Medicaid Other | Attending: Emergency Medicine | Admitting: Emergency Medicine

## 2016-12-13 ENCOUNTER — Encounter (HOSPITAL_COMMUNITY): Payer: Self-pay | Admitting: Neurology

## 2016-12-13 DIAGNOSIS — F17210 Nicotine dependence, cigarettes, uncomplicated: Secondary | ICD-10-CM | POA: Diagnosis not present

## 2016-12-13 DIAGNOSIS — F29 Unspecified psychosis not due to a substance or known physiological condition: Secondary | ICD-10-CM | POA: Diagnosis not present

## 2016-12-13 DIAGNOSIS — Z79899 Other long term (current) drug therapy: Secondary | ICD-10-CM | POA: Insufficient documentation

## 2016-12-13 DIAGNOSIS — K0889 Other specified disorders of teeth and supporting structures: Secondary | ICD-10-CM | POA: Insufficient documentation

## 2016-12-13 MED ORDER — AMOXICILLIN 500 MG PO CAPS: 500.0000 mg | ORAL_CAPSULE | Freq: Three times a day (TID) | ORAL | 0 refills | Status: DC

## 2016-12-13 MED ORDER — BUPIVACAINE-EPINEPHRINE (PF) 0.5% -1:200000 IJ SOLN
1.8000 mL | Freq: Once | INTRAMUSCULAR | Status: AC
  Administered 2016-12-13: 1.8 mL
  Filled 2016-12-13: qty 1.8

## 2016-12-13 MED ORDER — IBUPROFEN 200 MG PO TABS
600.0000 mg | ORAL_TABLET | Freq: Once | ORAL | Status: AC
  Administered 2016-12-13: 600 mg via ORAL
  Filled 2016-12-13: qty 1

## 2016-12-13 NOTE — ED Provider Notes (Signed)
[facility]-EMERGENCY DEPT Provider Note   CSN: [id number] Arrival date & time: 12/13/16  3086  By signing my name below, I, Debra Sullivan, attest that this documentation has been prepared under the direction and in the presence of Demetrios Loll, PA-C Electronically Signed: Cynda Sullivan, Scribe. 12/13/16. 9:26 AM.  History   Chief Complaint Chief Complaint  Patient presents with  . Dental Pain    HPI Comments: Debra Sullivan is a 31 y.o. female with a history of polysubstance abuse, who presents to the Emergency Department complaining of sudden-onset, constant left upper dental pain that began several days ago. Patient states she has severe dental pain due to a hole in her tooth. Patient states part of her tooth did fall out when she was eating. Patient does not have a primary dentist due to insurance issues. Patient denies any facial swelling or trouble swallowing. Patient has never had any dental problems in the past. . Patient reports taking tylenol with no relief. Cold sensation makes her pain worse, nothing improves her pain. Patient denies any fever, chills, nausea, or vomiting.    The history is provided by the patient. No language interpreter was used.    Past Medical History:  Diagnosis Date  . Drug abuse   . Medical history non-contributory   . Psychosis 2013    Patient Active Problem List   Diagnosis Date Noted  . Poor fetal growth affecting management of mother in first trimester 03/23/2016  . Twin gestation, dichorionic diamniotic 02/14/2016  . Supervision of high risk pregnancy, antepartum 02/03/2016  . 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 Cannabis use disorder, severe, dependence (HCC) 04/15/2015  . PTSD (post-traumatic stress disorder) 04/15/2015  . Cocaine use disorder, moderate, in early remission (HCC) 04/15/2015  . Psychosis 04/14/2015    Past Surgical History:  Procedure Laterality Date  . NO PAST  SURGERIES      OB History    Gravida Para Term Preterm AB Living   5 3 3   1 3    SAB TAB Ectopic Multiple Live Births   1       3       Home Medications    Prior to Admission medications   Medication Sig Start Date End Date Taking? Authorizing Provider  acetaminophen (TYLENOL) 325 MG tablet Take 650 mg by mouth every 6 (six) hours as needed for mild pain.    [provider]  hydrocortisone cream 1 % Apply to affected area 2 times daily Patient taking differently: Apply 1 application topically daily as needed (rash). Apply to affected area 2 times daily 03/15/16 03/15/17  Orvilla Cornwall A, CNM  levonorgestrel (PLAN B) 0.75 MG tablet Take 1 tablet (0.75 mg total) by mouth every 12 (twelve) hours. 08/09/16   Adam Phenix, MD  metroNIDAZOLE (FLAGYL) 500 MG tablet Take 1 tablet (500 mg total) by mouth 2 (two) times daily. Patient not taking: Reported on 03/22/2016 03/21/16   Orvilla Cornwall A, CNM  mupirocin ointment (BACTROBAN) 2 % Apply 1 application topically 2 (two) times daily as needed. Patient taking differently: Apply 1 application topically 2 (two) times daily as needed (rash).  03/15/16   Orvilla Cornwall A, CNM  Prenatal Vit-Fe Phos-FA-Omega (VITAFOL GUMMIES) 3.33-0.333-34.8 MG CHEW Chew 3 tablets by mouth daily. 09/22/15   Roe Coombs, CNM    Family History Family History  Problem Relation Age of Onset  . Diabetes Mother   . Schizophrenia Mother   .  Diabetes Brother   . Hypertension Maternal Aunt     Social History Social History  Substance Use Topics  . Smoking status: Current Some Day Smoker    Packs/day: 0.25    Types: Cigarettes    Last attempt to quit: 08/06/2015  . Smokeless tobacco: Never Used  . Alcohol use No     Allergies   Naproxen   Review of Systems Review of Systems  Constitutional: Negative for chills and fever.  HENT: Positive for dental problem. Negative for facial swelling and trouble swallowing.   Gastrointestinal:  Negative for nausea and vomiting.  Musculoskeletal: Positive for neck pain (left-sided).  Neurological: Positive for headaches.     Physical Exam Updated Vital Signs BP 120/85 (BP Location: Left Arm)   Pulse 76   Temp 98.2 F (36.8 C) (Oral)   Resp 18   Ht 5\' 6"  (1.676 m)   Wt 150 lb (68 kg)   LMP 12/07/2016   SpO2 100%   BMI 24.21 kg/m   Physical Exam  Constitutional: She appears well-developed and well-nourished. No distress.  Non-toxic appearing.   HENT:  Head: Normocephalic and atraumatic.  Right Ear: External ear normal.  Left Ear: External ear normal.  Mouth/Throat: Oropharynx is clear and moist. No oropharyngeal exudate.  4th left upper molar is cracked, the pulp is exposed. There is mild gingival edema, no erythema or gross abscess. No facial swelling, no submandibular or sublingual edema. Oropharynx is clear. She is managing secretions and tolerating her airway.   Eyes: Conjunctivae are normal. Pupils are equal, round, and reactive to light. Right eye exhibits no discharge. Left eye exhibits no discharge.  Neck: Normal range of motion. Neck supple. No JVD present. No tracheal deviation present.  Cardiovascular: Normal rate and intact distal pulses.   Pulmonary/Chest: Effort normal. No respiratory distress.  Lymphadenopathy:    She has no cervical adenopathy.  Neurological: Coordination normal.  Skin: No rash noted. She is not diaphoretic.  Psychiatric: She has a normal mood and affect. Her behavior is normal.  Nursing note and vitals reviewed.    ED Treatments / Results  DIAGNOSTIC STUDIES: Oxygen Saturation is 100% on RA, normal by my interpretation.    COORDINATION OF CARE: 9:25 AM Discussed treatment plan with pt at bedside and pt agreed to plan, which includes a dental block and pain medication.   Labs (all labs ordered are listed, but only abnormal results are displayed) Labs Reviewed - No data to display  EKG  EKG Interpretation None        Radiology No results found.  Procedures Dental Block Date/Time: 12/13/2016 9:46 AM Performed by: Rise Mu Authorized by: Jacalyn Lefevre   Consent:    Consent obtained:  Verbal   Consent given by:  Patient   Risks discussed:  Pain   Alternatives discussed:  No treatment Indications:    Indications: dental pain   Location:    Block type:  Supraperiosteal   Supraperiosteal location:  Upper teeth   Upper teeth location:  16/LU 3rd molar Procedure details (see MAR for exact dosages):    Needle gauge:  27 G   Anesthetic injected:  Bupivacaine 0.5% WITH epi   Injection procedure:  Anatomic landmarks identified, introduced needle, negative aspiration for blood, incremental injection and anatomic landmarks palpated Post-procedure details:    Outcome:  Anesthesia achieved   Patient tolerance of procedure:  Tolerated well, no immediate complications   (including critical care time)  Medications Ordered in ED Medications  bupivacaine-epinephrine (  MARCAINE W/ EPI) 0.5% -1:200000 injection 1.8 mL (1.8 mLs Infiltration Given 12/13/16 0945)  ibuprofen (ADVIL,MOTRIN) tablet 600 mg (600 mg Oral Given 12/13/16 0941)     Initial Impression / Assessment and Plan / ED Course  I have reviewed the triage vital signs and the nursing notes.  Pertinent labs & imaging results that were available during my care of the patient were reviewed by me and considered in my medical decision making (see chart for details).      Patient with toothache. No gross abscess. Exam unconcerning for Ludwig's angina or spread of infection.  Possible impacted wisdom tooth. Spoke with dentistry who will see patient in the office today. Will treat with amox, pain medicine, and dental block.   Final Clinical Impressions(s) / ED Diagnoses   Final diagnoses:  Pain, dental    New Prescriptions Discharge Medication List as of 12/13/2016  9:48 AM    START taking these medications   Details   amoxicillin (AMOXIL) 500 MG capsule Take 1 capsule (500 mg total) by mouth 3 (three) times daily., Starting Wed 12/13/2016, Print       I personally performed the services described in this documentation, which was scribed in my presence. The recorded information has been reviewed and is accurate.     Rise MuLeaphart, Adin Laker T, PA-C 12/13/16 1007    Jacalyn LefevreHaviland, Julie, MD 12/13/16 1025

## 2016-12-13 NOTE — ED Triage Notes (Signed)
Pt reports left upper dental pain for several days. Thinks she may have lost a tooth.

## 2016-12-13 NOTE — Discharge Instructions (Signed)
Motrin and Tylenol for pain. Please call the dentist number that I give you today and they will schedule another scheduled today. If there are not able to fit you in you will probably need to start taking the antibiotic.

## 2017-01-02 ENCOUNTER — Emergency Department (HOSPITAL_COMMUNITY)
Admission: EM | Admit: 2017-01-02 | Discharge: 2017-01-03 | Disposition: A | Discharge status: Home / Self Care | Payer: Medicaid Other | Attending: Emergency Medicine | Admitting: Emergency Medicine

## 2017-01-02 ENCOUNTER — Emergency Department (HOSPITAL_COMMUNITY): Payer: Medicaid Other

## 2017-01-02 ENCOUNTER — Encounter (HOSPITAL_COMMUNITY): Payer: Self-pay | Admitting: Emergency Medicine

## 2017-01-02 DIAGNOSIS — F1410 Cocaine abuse, uncomplicated: Secondary | ICD-10-CM | POA: Diagnosis not present

## 2017-01-02 DIAGNOSIS — S62653A Nondisplaced fracture of medial phalanx of left middle finger, initial encounter for closed fracture: Secondary | ICD-10-CM | POA: Diagnosis not present

## 2017-01-02 DIAGNOSIS — Z79899 Other long term (current) drug therapy: Secondary | ICD-10-CM | POA: Diagnosis not present

## 2017-01-02 DIAGNOSIS — F17210 Nicotine dependence, cigarettes, uncomplicated: Secondary | ICD-10-CM | POA: Diagnosis not present

## 2017-01-02 DIAGNOSIS — F329 Major depressive disorder, single episode, unspecified: Secondary | ICD-10-CM | POA: Diagnosis not present

## 2017-01-02 DIAGNOSIS — Y998 Other external cause status: Secondary | ICD-10-CM | POA: Insufficient documentation

## 2017-01-02 DIAGNOSIS — F1220 Cannabis dependence, uncomplicated: Secondary | ICD-10-CM | POA: Insufficient documentation

## 2017-01-02 DIAGNOSIS — Y929 Unspecified place or not applicable: Secondary | ICD-10-CM | POA: Insufficient documentation

## 2017-01-02 DIAGNOSIS — W500XXA Accidental hit or strike by another person, initial encounter: Secondary | ICD-10-CM | POA: Diagnosis not present

## 2017-01-02 DIAGNOSIS — Y9389 Activity, other specified: Secondary | ICD-10-CM | POA: Diagnosis not present

## 2017-01-02 DIAGNOSIS — S60943A Unspecified superficial injury of left middle finger, initial encounter: Secondary | ICD-10-CM | POA: Diagnosis present
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
## 2017-01-03 MED ORDER — IBUPROFEN 400 MG PO TABS
800.0000 mg | ORAL_TABLET | Freq: Once | ORAL | Status: AC
Start: 2017-01-03 — End: 2017-01-03
  Administered 2017-01-03: 800 mg via ORAL
  Filled 2017-01-03: qty 2

## 2017-01-03 NOTE — ED Notes (Signed)
Awaiting ortho to get appropriate splint

## 2017-01-03 NOTE — ED Notes (Signed)
Ortho tech at bedside 

## 2017-01-03 NOTE — ED Notes (Signed)
Pt. Ambulated off floor
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
## 2017-01-03 NOTE — ED Notes (Signed)
Ortho tech at bedside applying splint. 

## 2017-01-03 NOTE — ED Notes (Signed)
MD at bedside. 

## 2017-01-03 NOTE — Discharge Instructions (Signed)
Return to the ED with any concerns including increased pain, swelling/numbness/discoloration of finger, or any other alarming symptoms 

## 2017-04-14 ENCOUNTER — Inpatient Hospital Stay (HOSPITAL_COMMUNITY): Payer: Medicaid Other

## 2017-04-14 ENCOUNTER — Inpatient Hospital Stay (HOSPITAL_COMMUNITY): Payer: Medicaid Other | Admitting: Anesthesiology

## 2017-04-14 ENCOUNTER — Observation Stay (HOSPITAL_COMMUNITY)
Admission: AD | Admit: 2017-04-14 | Discharge: 2017-04-15 | Disposition: A | Discharge status: Home / Self Care | Payer: Medicaid Other | Source: Ambulatory Visit | Attending: Obstetrics and Gynecology | Admitting: Obstetrics and Gynecology

## 2017-04-14 ENCOUNTER — Encounter (HOSPITAL_COMMUNITY)
Admission: AD | Disposition: A | Discharge status: Home / Self Care | Payer: Self-pay | Source: Ambulatory Visit | Attending: Obstetrics and Gynecology

## 2017-04-14 ENCOUNTER — Encounter (HOSPITAL_COMMUNITY): Payer: Self-pay | Admitting: *Deleted

## 2017-04-14 DIAGNOSIS — O009 Unspecified ectopic pregnancy without intrauterine pregnancy: Secondary | ICD-10-CM | POA: Diagnosis present

## 2017-04-14 DIAGNOSIS — Z3A01 Less than 8 weeks gestation of pregnancy: Secondary | ICD-10-CM | POA: Diagnosis not present

## 2017-04-14 DIAGNOSIS — O00101 Right tubal pregnancy without intrauterine pregnancy: Secondary | ICD-10-CM | POA: Diagnosis not present

## 2017-04-14 DIAGNOSIS — Z818 Family history of other mental and behavioral disorders: Secondary | ICD-10-CM | POA: Diagnosis not present

## 2017-04-14 DIAGNOSIS — R102 Pelvic and perineal pain: Secondary | ICD-10-CM

## 2017-04-14 DIAGNOSIS — F17210 Nicotine dependence, cigarettes, uncomplicated: Secondary | ICD-10-CM | POA: Diagnosis not present

## 2017-04-14 DIAGNOSIS — Z79899 Other long term (current) drug therapy: Secondary | ICD-10-CM | POA: Insufficient documentation

## 2017-04-14 DIAGNOSIS — Z833 Family history of diabetes mellitus: Secondary | ICD-10-CM | POA: Diagnosis not present

## 2017-04-14 DIAGNOSIS — Z87892 Personal history of anaphylaxis: Secondary | ICD-10-CM | POA: Diagnosis not present

## 2017-04-14 DIAGNOSIS — Z886 Allergy status to analgesic agent status: Secondary | ICD-10-CM | POA: Diagnosis not present

## 2017-04-14 DIAGNOSIS — O99332 Smoking (tobacco) complicating pregnancy, second trimester: Secondary | ICD-10-CM | POA: Insufficient documentation

## 2017-04-14 DIAGNOSIS — O26899 Other specified pregnancy related conditions, unspecified trimester: Secondary | ICD-10-CM

## 2017-04-14 DIAGNOSIS — Z8249 Family history of ischemic heart disease and other diseases of the circulatory system: Secondary | ICD-10-CM | POA: Diagnosis not present

## 2017-04-14 HISTORY — DX: Anemia, unspecified: D64.9

## 2017-04-14 HISTORY — PX: LAPAROSCOPY: SHX197

## 2017-04-14 LAB — CBC WITH DIFFERENTIAL/PLATELET
BASOS ABS: 0 10*3/uL (ref 0.0–0.1)
BASOS PCT: 0 %
EOS ABS: 0.1 10*3/uL (ref 0.0–0.7)
EOS PCT: 1 %
HCT: 21.7 % — ABNORMAL LOW (ref 36.0–46.0)
Hemoglobin: 7.3 g/dL — ABNORMAL LOW (ref 12.0–15.0)
Lymphocytes Relative: 31 %
Lymphs Abs: 3.2 10*3/uL (ref 0.7–4.0)
MCH: 24.7 pg — ABNORMAL LOW (ref 26.0–34.0)
MCHC: 33.6 g/dL (ref 30.0–36.0)
MCV: 73.3 fL — ABNORMAL LOW (ref 78.0–100.0)
MONO ABS: 0.4 10*3/uL (ref 0.1–1.0)
MONOS PCT: 4 %
Neutro Abs: 6.4 10*3/uL (ref 1.7–7.7)
Neutrophils Relative %: 64 %
PLATELETS: 302 10*3/uL (ref 150–400)
RBC: 2.96 MIL/uL — ABNORMAL LOW (ref 3.87–5.11)
RDW: 15.7 % — AB (ref 11.5–15.5)
WBC: 10.1 10*3/uL (ref 4.0–10.5)

## 2017-04-14 LAB — PREPARE RBC (CROSSMATCH)

## 2017-04-14 LAB — URINALYSIS, ROUTINE W REFLEX MICROSCOPIC
Bilirubin Urine: NEGATIVE
GLUCOSE, UA: NEGATIVE mg/dL
HGB URINE DIPSTICK: NEGATIVE
Ketones, ur: NEGATIVE mg/dL
NITRITE: NEGATIVE
PH: 5 (ref 5.0–8.0)
PROTEIN: NEGATIVE mg/dL
Specific Gravity, Urine: 1.024 (ref 1.005–1.030)

## 2017-04-14 LAB — RAPID URINE DRUG SCREEN, HOSP PERFORMED
AMPHETAMINES: NOT DETECTED
BARBITURATES: NOT DETECTED
Benzodiazepines: NOT DETECTED
Cocaine: POSITIVE — AB
OPIATES: NOT DETECTED
TETRAHYDROCANNABINOL: POSITIVE — AB

## 2017-04-14 LAB — HCG, QUANTITATIVE, PREGNANCY: HCG, BETA CHAIN, QUANT, S: 5887 m[IU]/mL — AB (ref <5)

## 2017-04-14 LAB — POCT PREGNANCY, URINE: Preg Test, Ur: POSITIVE — AB

## 2017-04-14 SURGERY — LAPAROSCOPY OPERATIVE
Anesthesia: General | Site: Abdomen

## 2017-04-14 MED ORDER — OXYCODONE HCL 5 MG/5ML PO SOLN: 5.0000 mg | Freq: Once | ORAL | Status: DC | PRN

## 2017-04-14 MED ORDER — SUGAMMADEX SODIUM 200 MG/2ML IV SOLN
INTRAVENOUS | Status: DC | PRN
  Administered 2017-04-14: 150 mg via INTRAVENOUS

## 2017-04-14 MED ORDER — LIDOCAINE HCL (CARDIAC) 20 MG/ML IV SOLN
INTRAVENOUS | Status: DC | PRN
  Administered 2017-04-14: 50 mg via INTRAVENOUS

## 2017-04-14 MED ORDER — SUCCINYLCHOLINE CHLORIDE 200 MG/10ML IV SOSY
PREFILLED_SYRINGE | INTRAVENOUS | Status: DC | PRN
  Administered 2017-04-14: 120 mg via INTRAVENOUS

## 2017-04-14 MED ORDER — LACTATED RINGERS IV SOLN
INTRAVENOUS | Status: DC | PRN
  Administered 2017-04-14: 22:00:00 via INTRAVENOUS

## 2017-04-14 MED ORDER — FENTANYL CITRATE (PF) 250 MCG/5ML IJ SOLN
INTRAMUSCULAR | Status: AC
  Filled 2017-04-14: qty 5

## 2017-04-14 MED ORDER — ROCURONIUM BROMIDE 100 MG/10ML IV SOLN
INTRAVENOUS | Status: DC | PRN
  Administered 2017-04-14: 30 mg via INTRAVENOUS

## 2017-04-14 MED ORDER — DEXAMETHASONE SODIUM PHOSPHATE 10 MG/ML IJ SOLN
INTRAMUSCULAR | Status: DC | PRN
  Administered 2017-04-14: 10 mg via INTRAVENOUS

## 2017-04-14 MED ORDER — OXYCODONE HCL 5 MG PO TABS: 5.0000 mg | ORAL_TABLET | Freq: Once | ORAL | Status: DC | PRN

## 2017-04-14 MED ORDER — ONDANSETRON HCL 4 MG/2ML IJ SOLN
INTRAMUSCULAR | Status: DC | PRN
  Administered 2017-04-14: 4 mg via INTRAVENOUS

## 2017-04-14 MED ORDER — SODIUM CHLORIDE 0.9 % IV SOLN
Freq: Once | INTRAVENOUS | Status: AC
  Administered 2017-04-14: 21:00:00 via INTRAVENOUS

## 2017-04-14 MED ORDER — SOD CITRATE-CITRIC ACID 500-334 MG/5ML PO SOLN
30.0000 mL | Freq: Once | ORAL | Status: AC
Start: 2017-04-14 — End: 2017-04-14
  Administered 2017-04-14: 30 mL via ORAL

## 2017-04-14 MED ORDER — ONDANSETRON HCL 4 MG/2ML IJ SOLN
INTRAMUSCULAR | Status: AC
  Filled 2017-04-14: qty 2

## 2017-04-14 MED ORDER — LIDOCAINE HCL (CARDIAC) 20 MG/ML IV SOLN
INTRAVENOUS | Status: AC
  Filled 2017-04-14: qty 5

## 2017-04-14 MED ORDER — DEXAMETHASONE SODIUM PHOSPHATE 10 MG/ML IJ SOLN
INTRAMUSCULAR | Status: AC
  Filled 2017-04-14: qty 1

## 2017-04-14 MED ORDER — CEFAZOLIN SODIUM-DEXTROSE 2-4 GM/100ML-% IV SOLN
2.0000 g | Freq: Once | INTRAVENOUS | Status: AC
  Administered 2017-04-14: 2 g via INTRAVENOUS
  Filled 2017-04-14: qty 100

## 2017-04-14 MED ORDER — HYDROMORPHONE HCL 1 MG/ML IJ SOLN
0.2500 mg | INTRAMUSCULAR | Status: DC | PRN
  Administered 2017-04-15 (×4): 0.5 mg via INTRAVENOUS

## 2017-04-14 MED ORDER — SOD CITRATE-CITRIC ACID 500-334 MG/5ML PO SOLN
ORAL | Status: AC
  Filled 2017-04-14: qty 15

## 2017-04-14 MED ORDER — MEPERIDINE HCL 25 MG/ML IJ SOLN: 6.2500 mg | INTRAMUSCULAR | Status: DC | PRN

## 2017-04-14 MED ORDER — MIDAZOLAM HCL 2 MG/2ML IJ SOLN
INTRAMUSCULAR | Status: AC
  Filled 2017-04-14: qty 2

## 2017-04-14 MED ORDER — SODIUM CHLORIDE 0.9 % IV SOLN
INTRAVENOUS | Status: DC | PRN
  Administered 2017-04-14: 22:00:00 via INTRAVENOUS

## 2017-04-14 MED ORDER — MIDAZOLAM HCL 5 MG/5ML IJ SOLN
INTRAMUSCULAR | Status: DC | PRN
  Administered 2017-04-14: 2 mg via INTRAVENOUS

## 2017-04-14 MED ORDER — FENTANYL CITRATE (PF) 250 MCG/5ML IJ SOLN
INTRAMUSCULAR | Status: DC | PRN
Start: 2017-04-14 — End: 2017-04-14
  Administered 2017-04-14: 50 ug via INTRAVENOUS
  Administered 2017-04-14: 100 ug via INTRAVENOUS
  Administered 2017-04-14: 50 ug via INTRAVENOUS
  Administered 2017-04-14: 100 ug via INTRAVENOUS
  Administered 2017-04-14: 50 ug via INTRAVENOUS
  Administered 2017-04-14: 150 ug via INTRAVENOUS

## 2017-04-14 MED ORDER — PROPOFOL 10 MG/ML IV BOLUS
INTRAVENOUS | Status: AC
  Filled 2017-04-14: qty 20

## 2017-04-14 MED ORDER — ACETAMINOPHEN 10 MG/ML IV SOLN
INTRAVENOUS | Status: AC
  Filled 2017-04-14: qty 100

## 2017-04-14 MED ORDER — PROPOFOL 10 MG/ML IV BOLUS
INTRAVENOUS | Status: DC | PRN
  Administered 2017-04-14: 150 mg via INTRAVENOUS

## 2017-04-14 MED ORDER — PROMETHAZINE HCL 25 MG/ML IJ SOLN: 6.2500 mg | INTRAMUSCULAR | Status: DC | PRN

## 2017-04-14 MED ORDER — HYDROMORPHONE HCL 1 MG/ML IJ SOLN
INTRAMUSCULAR | Status: AC
  Filled 2017-04-14: qty 1

## 2017-04-14 MED ORDER — ACETAMINOPHEN 10 MG/ML IV SOLN
1000.0000 mg | Freq: Once | INTRAVENOUS | Status: DC | PRN
  Administered 2017-04-15: 1000 mg via INTRAVENOUS

## 2017-04-14 MED ORDER — GLYCOPYRROLATE 0.2 MG/ML IJ SOLN
INTRAMUSCULAR | Status: AC
  Filled 2017-04-14: qty 4

## 2017-04-14 MED ORDER — SUCCINYLCHOLINE CHLORIDE 200 MG/10ML IV SOSY
PREFILLED_SYRINGE | INTRAVENOUS | Status: AC
  Filled 2017-04-14: qty 10

## 2017-04-14 SURGICAL SUPPLY — 25 items
APL SRG 38 LTWT LNG FL B (MISCELLANEOUS) ×1
APPLICATOR ARISTA FLEXITIP XL (MISCELLANEOUS) ×2 IMPLANT
CLOSURE WOUND 1/4X4 (GAUZE/BANDAGES/DRESSINGS) ×1
DRSG OPSITE POSTOP 3X4 (GAUZE/BANDAGES/DRESSINGS) ×2 IMPLANT
GLOVE BIOGEL PI IND STRL 7.0 (GLOVE) IMPLANT
GLOVE BIOGEL PI INDICATOR 7.0 (GLOVE) ×4
GLOVE ECLIPSE 6.5 STRL STRAW (GLOVE) ×2 IMPLANT
GLOVE INDICATOR 6.5 STRL GRN (GLOVE) ×4 IMPLANT
GLOVE SKINSENSE NS SZ7.0 (GLOVE) ×2
GLOVE SKINSENSE STRL SZ7.0 (GLOVE) IMPLANT
GOWN SRG XL LVL 4 BRTHBL STRL (GOWNS) IMPLANT
GOWN STRL NON-REIN XL LVL4 (GOWNS) ×6
HEMOSTAT ARISTA ABSORB 3G PWDR (MISCELLANEOUS) ×2 IMPLANT
IV SOD CHL 0.9% 1000ML (IV SOLUTION) ×2 IMPLANT
NDL INSUFF ACCESS 14 VERSASTEP (NEEDLE) ×2 IMPLANT
PACK LAPAROSCOPY BASIN (CUSTOM PROCEDURE TRAY) ×2 IMPLANT
SET IRRIG TUBING LAPAROSCOPIC (IRRIGATION / IRRIGATOR) ×2 IMPLANT
STRIP CLOSURE SKIN 1/4X4 (GAUZE/BANDAGES/DRESSINGS) ×1 IMPLANT
SUT MON AB 4-0 PS1 27 (SUTURE) ×2 IMPLANT
SUT VICRYL 0 UR6 27IN ABS (SUTURE) ×2 IMPLANT
TOWEL OR 17X24 6PK STRL BLUE (TOWEL DISPOSABLE) ×4 IMPLANT
TROCAR VERSASTEP PLUS 12MM (TROCAR) ×2 IMPLANT
TROCAR VERSASTEP PLUS 5MM (TROCAR) ×2 IMPLANT
TROCAR XCEL NON-BLD 5MMX100MML (ENDOMECHANICALS) ×2 IMPLANT
WARMER LAPAROSCOPE (MISCELLANEOUS) ×2 IMPLANT

## 2017-04-14 NOTE — Anesthesia Preprocedure Evaluation (Signed)
Anesthesia Evaluation  Patient identified by MRN, date of birth, ID band Patient awake    Reviewed: Allergy & Precautions, H&P , Patient's Chart, lab work & pertinent test results  Airway Mallampati: III  TM Distance: >3 FB Neck ROM: full    Dental no notable dental hx. (+) Teeth Intact   Pulmonary neg pulmonary ROS, Current Smoker,    Pulmonary exam normal breath sounds clear to auscultation       Cardiovascular negative cardio ROS   Rhythm:regular Rate:Normal     Neuro/Psych negative neurological ROS  negative psych ROS   GI/Hepatic negative GI ROS, Neg liver ROS,   Endo/Other  negative endocrine ROS  Renal/GU negative Renal ROS     Musculoskeletal   Abdominal   Peds  Hematology negative hematology ROS (+)   Anesthesia Other Findings   Reproductive/Obstetrics (+) Pregnancy Ectopic                             Anesthesia Physical  Anesthesia Plan  ASA: II  Anesthesia Plan: General   Post-op Pain Management:    Induction: Intravenous, Rapid sequence and Cricoid pressure planned  PONV Risk Score and Plan: 4 or greater and Ondansetron, Dexamethasone, Midazolam, Scopolamine patch - Pre-op and Treatment may vary due to age or medical condition  Airway Management Planned: Oral ETT  Additional Equipment:   Intra-op Plan:   Post-operative Plan: Extubation in OR  Informed Consent: I have reviewed the patients History and Physical, chart, labs and discussed the procedure including the risks, benefits and alternatives for the proposed anesthesia with the patient or authorized representative who has indicated his/her understanding and acceptance.   Dental advisory given  Plan Discussed with: CRNA  Anesthesia Plan Comments:         Anesthesia Quick Evaluation

## 2017-04-14 NOTE — H&P (Signed)
OB/GYN History and Physical  Debra Sullivan is a 31 y.o. [phone] presenting for two days of abdominal pain. Reports 10/10 pain in abdomen and lower back.  She used cocaine last evening. Suspected she was pregnant but had not had confirmatory testing. This is not a desired pregnancy.       Past Medical History:  Diagnosis Date  . Anemia   . Drug abuse (HCC)   . Medical history non-contributory   . Psychosis (HCC) 2013    Past Surgical History:  Procedure Laterality Date  . CESAREAN SECTION      OB History  Gravida Para Term Preterm AB Living  6 4 4   1 5   SAB TAB Ectopic Multiple Live Births  1     1 5     # Outcome Date GA Lbr Len/2nd Weight Sex Delivery Anes PTL Lv  6 Current           5 Term 04/08/11 [redacted]w[redacted]d 03:56 / 00:20 7 lb 3.2 oz (3.265 kg) M Vag-Spont EPI  LIV     Birth Comments: wnl  4 Term 06/01/06    M Vag-Spont None N LIV  3A Term      Vag-Spont   LIV  3B Term      CS-Unspec   LIV  2 SAB              Birth Comments: System Generated. Please review and update pregnancy details.  1 Term      Vag-Spont   LIV      Social History   Social History  . Marital status: Single    Spouse name: N/A  . Number of children: N/A  . Years of education: N/A   Social History Main Topics  . Smoking status: Current Some Day Smoker    Packs/day: 0.25    Types: Cigarettes    Last attempt to quit: 08/06/2015  . Smokeless tobacco: Never Used  . Alcohol use No  . Drug use: No     Comment: Cocaine & Marijuana was used10/26/2018  . Sexual activity: Yes    Birth control/ protection: None   Other Topics Concern  . None   Social History Narrative  . None    Family History  Problem Relation Age of Onset  . Diabetes Mother   . Schizophrenia Mother   . Diabetes Brother   . Hypertension Maternal Aunt     Prescriptions Prior to Admission  Medication Sig Dispense Refill Last Dose  . ibuprofen (ADVIL,MOTRIN) 200 MG tablet Take 200 mg by mouth every 6 (six) hours as  needed.   04/14/2017 at Unknown time  . acetaminophen (TYLENOL) 325 MG tablet Take 650 mg by mouth every 6 (six) hours as needed for mild pain.   Past Week at Unknown time  . amoxicillin (AMOXIL) 500 MG capsule Take 1 capsule (500 mg total) by mouth 3 (three) times daily. 21 capsule 0   . levonorgestrel (PLAN B) 0.75 MG tablet Take 1 tablet (0.75 mg total) by mouth every 12 (twelve) hours. 2 tablet 0   . metroNIDAZOLE (FLAGYL) 500 MG tablet Take 1 tablet (500 mg total) by mouth 2 (two) times daily. (Patient not taking: Reported on 03/22/2016) 14 tablet 0 Not Taking at Unknown time  . mupirocin ointment (BACTROBAN) 2 % Apply 1 application topically 2 (two) times daily as needed. (Patient taking differently: Apply 1 application topically 2 (two) times daily as needed (rash). ) 30 g PRN 03/21/2016 at Unknown time  .  Prenatal Vit-Fe Phos-FA-Omega (VITAFOL GUMMIES) 3.33-0.333-34.8 MG CHEW Chew 3 tablets by mouth daily. 90 tablet 12 03/21/2016 at Unknown time    Allergies  Allergen Reactions  . Naproxen Anaphylaxis    Review of Systems: Negative except for what is mentioned in HPI.     Physical Exam: BP 112/84   Pulse 97   Temp 98.9 F (37.2 C)   Resp 18   Ht 5\' 5"  (1.651 m)   Wt 147 lb (66.7 kg)   LMP 03/08/2017   SpO2 100%   BMI 24.46 kg/m  CONSTITUTIONAL: Well-developed, well-nourished female in mild distress.  HENT:  Normocephalic, atraumatic, External right and left ear normal. Oropharynx is clear and moist EYES: Conjunctivae and EOM are normal. Pupils are equal, round, and reactive to light. No scleral icterus.  NECK: Normal range of motion, supple, no masses SKIN: Skin is warm and dry. No rash noted. Not diaphoretic. No erythema. No pallor. NEUROLGIC: Alert and oriented to person, place, and time. Normal reflexes, muscle tone coordination. No cranial nerve deficit noted. PSYCHIATRIC: Normal mood and affect. Normal behavior. Normal judgment and thought content. CARDIOVASCULAR:  Normal heart rate noted, regular rhythm RESPIRATORY: Effort and breath sounds normal, no problems with respiration noted ABDOMEN: Soft, tender to palpation in lower quadrants, Well-healed Pfannenstiel incision. PELVIC: Deferred MUSCULOSKELETAL: Normal range of motion. No edema and no tenderness. 2+ distal pulses.   Pertinent Labs/Studies:   Results for orders placed or performed during the hospital encounter of 04/14/17 (from the past 72 hour(s))  Urinalysis, Routine w reflex microscopic     Status: Abnormal   Collection Time: 04/14/17  7:32 PM  Result Value Ref Range   Color, Urine YELLOW YELLOW   APPearance HAZY (A) CLEAR   Specific Gravity, Urine 1.024 1.005 - 1.030   pH 5.0 5.0 - 8.0   Glucose, UA NEGATIVE NEGATIVE mg/dL   Hgb urine dipstick NEGATIVE NEGATIVE   Bilirubin Urine NEGATIVE NEGATIVE   Ketones, ur NEGATIVE NEGATIVE mg/dL   Protein, ur NEGATIVE NEGATIVE mg/dL   Nitrite NEGATIVE NEGATIVE   Leukocytes, UA MODERATE (A) NEGATIVE   RBC / HPF 0-5 0 - 5 RBC/hpf   WBC, UA 6-30 0 - 5 WBC/hpf   Bacteria, UA RARE (A) NONE SEEN   Squamous Epithelial / LPF 6-30 (A) NONE SEEN   Mucus PRESENT   Urine rapid drug screen (hosp performed)     Status: Abnormal   Collection Time: 04/14/17  7:32 PM  Result Value Ref Range   Opiates NONE DETECTED NONE DETECTED   Cocaine POSITIVE (A) NONE DETECTED   Benzodiazepines NONE DETECTED NONE DETECTED   Amphetamines NONE DETECTED NONE DETECTED   Tetrahydrocannabinol POSITIVE (A) NONE DETECTED   Barbiturates NONE DETECTED NONE DETECTED    Comment:        DRUG SCREEN FOR MEDICAL PURPOSES ONLY.  IF CONFIRMATION IS NEEDED FOR ANY PURPOSE, NOTIFY LAB WITHIN 5 DAYS.        LOWEST DETECTABLE LIMITS FOR URINE DRUG SCREEN Drug Class       Cutoff (ng/mL) Amphetamine      1000 Barbiturate      200 Benzodiazepine   200 Tricyclics       300 Opiates          300 Cocaine          300 THC              50   Pregnancy, urine POC     Status: Abnormal  Collection Time: 04/14/17  7:46 PM  Result Value Ref Range   Preg Test, Ur POSITIVE (A) NEGATIVE    Comment:        THE SENSITIVITY OF THIS METHODOLOGY IS >24 mIU/mL   CBC with Differential     Status: Abnormal (Preliminary result)   Collection Time: 04/14/17  8:35 PM  Result Value Ref Range   WBC 10.1 4.0 - 10.5 K/uL   RBC 2.96 (L) 3.87 - 5.11 MIL/uL   Hemoglobin 7.3 (L) 12.0 - 15.0 g/dL   HCT 16.1 (L) 09.6 - 04.5 %   MCV 73.3 (L) 78.0 - 100.0 fL   MCH 24.7 (L) 26.0 - 34.0 pg   MCHC 33.6 30.0 - 36.0 g/dL   RDW 40.9 (H) 81.1 - 91.4 %   Platelets 302 150 - 400 K/uL   Neutrophils Relative % 64 %   Neutro Abs 6.4 1.7 - 7.7 K/uL   Lymphocytes Relative 31 %   Lymphs Abs 3.2 0.7 - 4.0 K/uL   Monocytes Relative 4 %   Monocytes Absolute 0.4 0.1 - 1.0 K/uL   Eosinophils Relative 1 %   Eosinophils Absolute 0.1 0.0 - 0.7 K/uL   Basophils Relative 0 %   Basophils Absolute 0.0 0.0 - 0.1 K/uL   Other PENDING %   US Ob Comp Less 14 Wks  Result Date: 04/14/2017 CLINICAL DATA:  Pelvic pain. Gestational age by LMP of 5 weeks 2 days. EXAM: OBSTETRIC <14 WK [place] AND TRANSVAGINAL OB US TECHNIQUE: Both transabdominal and transvaginal ultrasound examinations were performed for complete evaluation of the gestation as well as the maternal uterus, adnexal regions, and pelvic cul-de-sac. Transvaginal technique was performed to assess early pregnancy. COMPARISON:  None. FINDINGS: Intrauterine gestational sac: None Maternal uterus/adnexae: A complex hypoechoic mass is seen in the right adnexa which measures 6.5 x 4.7 x 6.8 cm. This appears separate from the ovaries. A moderate amount of complex free fluid is also seen. IMPRESSION: No IUP visualized. 6.8 cm right adnexal mass with moderate amount of free fluid, consistent with ruptured ectopic pregnancy. Critical Value/emergent results were called by telephone at the time of interpretation on 04/14/2017 at 8:34 pm to the patient's nurse in MAU, who verbally  acknowledged these results. Electronically Signed   By: Myles Rosenthal M.D.   On: 04/14/2017 20:35        Assessment and Plan :EREKA BRAU is a 31 y.o. [id number] admitted for emergent diagnostic laparoscopy for suspected ruptured ectopic pregnancy. Reviewed recommendation for urgent surgery given findings of significant fluid in abdomen and 6.8 adnexal structure, consistent with ectopic per radiology. Reviewed risks of surgery with patient including risk of infection, hemorrhage, damage to surrounding tissue and organs, possible laparotomy. Reviewed that I will assess and remove ectopic pregnancy as needed, which may include salpingectomy or oophorectomy. She is agreeable to blood transfusion if necessary. She verbalizes understanding of risks and plan and signed consent for surgery  Findings notable for cocaine on UDS (admitted to use last night) and H/H of 7.3/217.  4 units blood on hold  Plan for diagnostic laparoscopy, possible salpingectomy, possible oophorectomy NPO Admission labs ordered VS Q4 Ancef 2 gm   K. Therese Sarah, M.D. Attending Obstetrician & Gynecologist, [facility] for [employer], [facility]

## 2017-04-14 NOTE — Transfer of Care (Signed)
Immediate Anesthesia Transfer of Care Note  Patient: Debra Sullivan  Procedure(s) Performed: LAPAROSCOPY OPERATIVE WITH RIGHT SALPINGECTOMY (N/A Abdomen)  Patient Location: PACU  Anesthesia Type:General  Level of Consciousness: awake  Airway & Oxygen Therapy: Patient Spontanous Breathing and Patient connected to nasal cannula oxygen  Post-op Assessment: Report given to RN and Post -op Vital signs reviewed and stable  Post vital signs: stable  Last Vitals:  Vitals:   04/14/17 2104 04/14/17 2119  BP: (!) 100/58 (!) 112/50  Pulse: 95 (!) 101  Resp: 18 18  Temp: 37.1 C   SpO2:  100%    Last Pain:  Vitals:   04/14/17 2104  TempSrc: Oral  PainSc:       Patients Stated Pain Goal: 0 (04/14/17 1947)  Complications: No apparent anesthesia complications

## 2017-04-14 NOTE — MAU Provider Note (Signed)
History   [id number] in with c/o low back and abd pain in preg. States has been going on for several days. Admits to cocaine use last night. Also states does not want to be pregnant.   CSN: [id number]  Arrival date & time 04/14/17  1915   None     No chief complaint on file.   HPI  Past Medical History:  Diagnosis Date  . Anemia   . Drug abuse (HCC)   . Medical history non-contributory   . Psychosis (HCC) 2013    Past Surgical History:  Procedure Laterality Date  . CESAREAN SECTION      Family History  Problem Relation Age of Onset  . Diabetes Mother   . Schizophrenia Mother   . Diabetes Brother   . Hypertension Maternal Aunt     Social History  Substance Use Topics  . Smoking status: Current Some Day Smoker    Packs/day: 0.25    Types: Cigarettes    Last attempt to quit: 08/06/2015  . Smokeless tobacco: Never Used  . Alcohol use No    OB History    Gravida Para Term Preterm AB Living   6 4 4   1 5    SAB TAB Ectopic Multiple Live Births   1     1 5       Review of Systems  Constitutional: Negative.   HENT: Negative.   Eyes: Negative.   Respiratory: Negative.   Cardiovascular: Negative.   Gastrointestinal: Positive for abdominal pain.  Endocrine: Negative.   Genitourinary: Negative.   Musculoskeletal: Negative.   Skin: Negative.   Allergic/Immunologic: Negative.   Neurological: Negative.   Hematological: Negative.   Psychiatric/Behavioral: Negative.     Allergies  Naproxen  Home Medications    BP 105/60   Pulse 98   Temp 98.9 F (37.2 C)   Resp 16   Ht 5\' 5"  (1.651 m)   Wt 147 lb (66.7 kg)   LMP 03/08/2017   SpO2 100%   BMI 24.46 kg/m   Physical Exam  Constitutional: She is oriented to person, place, and time. She appears well-developed and well-nourished.  HENT:  Head: Normocephalic.  Eyes: Pupils are equal, round, and reactive to light.  Neck: Normal range of motion.  Cardiovascular: Normal rate, regular rhythm, normal heart  sounds and intact distal pulses.   Pulmonary/Chest: Effort normal and breath sounds normal.  Abdominal: Soft. Bowel sounds are normal.  Genitourinary: Vagina normal and uterus normal.  Musculoskeletal: Normal range of motion.  Neurological: She is alert and oriented to person, place, and time. She has normal reflexes.  Skin: Skin is warm and dry.  Psychiatric: She has a normal mood and affect. Her behavior is normal. Judgment and thought content normal.    MAU Course  Procedures (including critical care time)  Labs Reviewed  URINALYSIS, ROUTINE W REFLEX MICROSCOPIC - Abnormal; Notable for the following:       Result Value   APPearance HAZY (*)    Leukocytes, UA MODERATE (*)    Bacteria, UA RARE (*)    Squamous Epithelial / LPF 6-30 (*)    All other components within normal limits  POCT PREGNANCY, URINE - Abnormal; Notable for the following:    Preg Test, Ur POSITIVE (*)    All other components within normal limits  HCG, QUANTITATIVE, PREGNANCY  RAPID URINE DRUG SCREEN, HOSP PERFORMED   No results found.   1. Pelvic pain in pregnancy       MDM  VSS, Lengthy discussion regarding her pregnancy options, encouraged her not to use drugs.VSS, Will get quant and and OB US shows ruptured ectopic pregnanct. Dr. Earlene Plater in to assess pt.

## 2017-04-14 NOTE — MAU Note (Signed)
Pain in lower abd and back since yesterday morning. Have had miscarriage in past and I feel the same. No bleeding or vag d/c. LMP 03/08/2017

## 2017-04-14 NOTE — Anesthesia Procedure Notes (Signed)
Procedure Name: Intubation Date/Time: 04/14/2017 9:49 PM Performed by: Renford DillsMULLINS, Kathryn Cosby L Pre-anesthesia Checklist: Patient identified, Patient being monitored, Timeout performed, Emergency Drugs available and Suction available Patient Re-evaluated:Patient Re-evaluated prior to induction Oxygen Delivery Method: Circle System Utilized Preoxygenation: Pre-oxygenation with 100% oxygen Induction Type: IV induction, Rapid sequence and Cricoid Pressure applied Ventilation: Mask ventilation without difficulty Laryngoscope Size: Miller and 2 Grade View: Grade II Tube type: Oral Tube size: 7.0 mm Number of attempts: 1 Airway Equipment and Method: stylet Placement Confirmation: ETT inserted through vocal cords under direct vision,  positive ETCO2 and breath sounds checked- equal and bilateral Secured at: 21 cm Tube secured with: Tape Dental Injury: Teeth and Oropharynx as per pre-operative assessment

## 2017-04-14 NOTE — Op Note (Addendum)
Lucila Maine PROCEDURE DATE: 04/15/2017  PREOPERATIVE DIAGNOSES: suspected ruptured ectopic pregnancy POSTOPERATIVE DIAGNOSES: right ruptured ectopic pregnancy PROCEDURE: laparoscopic right salpingectomy, evacuation of hemoperitoneum  SURGEON:  Dr. Baldemar Lenis ASSISTANT: none ANESTHESIOLOGIST: Dr. Renold Don  INDICATIONS: 31 y.o. [id number] with aforementioned preoperative diagnoses here today for definitive surgical management.   Risks of surgery were discussed with the patient including but not limited to: bleeding which may require transfusion or reoperation; infection which may require antibiotics; injury to bowel, bladder, ureters or other surrounding organs; need for additional procedures including laparotomy; thromboembolic phenomenon, incisional problems and other postoperative/anesthesia complications. Written informed consent was obtained.    FINDINGS:  Significant hemoperitoneum on entry into abdomen. Approximately 700 mL bright red blood and clots in pelvis. Ruptured ectopic pregnancy of right fallopian tube with ongoing bleeding noted. Right ovary normal appearing, left fallopian tube and ovary normal appearing. Normal appearing uterus with small right fundal fibroid. Abdomen otherwise unremarkable. No evidence of endometriosis, adhesions or any other abdominal/pelvic abnormality.  Normal upper abdomen.  ANESTHESIA:    General INTRAVENOUS FLUIDS: 2100 ml ESTIMATED BLOOD LOSS: 800 ml SPECIMENS:  Right fallopian tube COMPLICATIONS: None immediate   PROCEDURE IN DETAIL:  The patient received intravenous antibiotics and had sequential compression devices applied to her lower extremities while in the preoperative area.  She was then taken to the operating room where general anesthesia was administered and was found to be adequate.  She was placed in the dorsal lithotomy position, and was prepped and draped in a sterile manner.  A Foley catheter was inserted into her bladder and  attached to constant drainage. A timeout was performed to verify patient and procedure. A uterine manipulator was then advanced into the uterus. Attention was then turned to the patient's abdomen where a 5-mm skin incision was made in the umbilical fold.  The varess needle was advanced without difficulty and the abdomen insufflated with carbon dioxide gas with an opening pressure of 2 mm. Pneumoperitoneum was obtained to 15 mm Hg and the varess needle was removed and reinserted with a sheath. The varess needle was removed and a 5 mm port was advanced through the sheath. The camera was introduced and survey of the abdomen showed atraumatic entry. Survey of the abdomen and pelvis were as noted above.  A 5 mm port was placed into the left lower abdomen 2 cm proximal and median from the left ASIS after incision with a scalpel. The varess needle and sheath was advanced under direct visualization using the laparoscope. A 10 mm port was placed transversely along the suprapubic area just proximal to her prior c-section scar after incision with the scalpel. The varess needle and sheath was advanced under direct visualization with the laparoscope. The patient was placed into trendelenburg and the hemoperitoneum was evacuated until the right tube could be visualized. Given the amount of free blood in abdomen and low starting H/H, decision made to transfuse 2 units which was begun at this point.  The right tube was then grasped using an atraumatic grasper and followed out to the fimbriated end with the rupture visibly bleeding at the midpoint of the tube. The right fallopian tube was removed using a 5 mm Ligasure to ligate and transect along the mesosalpinx just inferior to the tube. Hemostasis noted at ligation site. The tube was removed and sent to pathology. The pelvis was irrigated and suctioned and a 10 mm bag was introduced to remove a large clot. After suction irrigation, a small amount of  oozing was noted at the  edge of the mesosalpinx which was cauterized and hemostasis noted. After inspection for several minutes, no further bleeding noted. Arista was placed at the site for additional hemostasis. The left tube and ovary were inspected and noted to be normal. The abdomen was again inspected and no bleeding or other abnormalities noted. At this point the procedure was completed and the trocars were removed from the abdomen. The pneumoperitoneum was removed as much as possible. The fascia of the 10 mm port was closed using 0-Vicryl. The skin of all ports was closed using 4-0 Monocryl. The uterine manipulator was removed, a small amount of bleeding noted from vagina that improved with pressure and no bleeding noted at the site of the tenaculum.   She was extubated in the OR and taken to PACU for recovery in stable condition. 2nd unit of blood was transfusing at the close of the case.     Baldemar LenisK. Meryl Kamuela Magos, M.D. Attending Obstetrician & Gynecologist, [facility] for [employer], [facility]

## 2017-04-15 DIAGNOSIS — O009 Unspecified ectopic pregnancy without intrauterine pregnancy: Secondary | ICD-10-CM | POA: Diagnosis present

## 2017-04-15 LAB — CBC
HEMATOCRIT: 24.5 % — AB (ref 36.0–46.0)
HEMOGLOBIN: 8.4 g/dL — AB (ref 12.0–15.0)
MCH: 26.2 pg (ref 26.0–34.0)
MCHC: 34.3 g/dL (ref 30.0–36.0)
MCV: 76.3 fL — ABNORMAL LOW (ref 78.0–100.0)
Platelets: 245 10*3/uL (ref 150–400)
RBC: 3.21 MIL/uL — ABNORMAL LOW (ref 3.87–5.11)
RDW: 16.4 % — ABNORMAL HIGH (ref 11.5–15.5)
WBC: 11.1 10*3/uL — ABNORMAL HIGH (ref 4.0–10.5)

## 2017-04-15 LAB — RPR: RPR Ser Ql: NONREACTIVE

## 2017-04-15 LAB — HIV ANTIBODY (ROUTINE TESTING W REFLEX): HIV Screen 4th Generation wRfx: NONREACTIVE

## 2017-04-15 LAB — HEPATITIS B SURFACE ANTIGEN: Hepatitis B Surface Ag: NEGATIVE

## 2017-04-15 MED ORDER — OXYCODONE HCL 5 MG PO TABS: 5.0000 mg | ORAL_TABLET | ORAL | 0 refills | Status: DC | PRN

## 2017-04-15 MED ORDER — HYDROMORPHONE HCL 1 MG/ML IJ SOLN
INTRAMUSCULAR | Status: AC
  Filled 2017-04-15: qty 1

## 2017-04-15 MED ORDER — ONDANSETRON HCL 4 MG/2ML IJ SOLN: 4.0000 mg | Freq: Four times a day (QID) | INTRAMUSCULAR | Status: DC | PRN

## 2017-04-15 MED ORDER — OXYCODONE HCL 5 MG PO TABS
10.0000 mg | ORAL_TABLET | ORAL | Status: DC | PRN
  Administered 2017-04-15 (×2): 10 mg via ORAL
  Filled 2017-04-15 (×3): qty 2

## 2017-04-15 MED ORDER — HYDROMORPHONE HCL 1 MG/ML IJ SOLN
INTRAMUSCULAR | Status: AC
  Administered 2017-04-15: 0.5 mg via INTRAVENOUS
  Filled 2017-04-15: qty 1

## 2017-04-15 MED ORDER — HYDROMORPHONE HCL 1 MG/ML IJ SOLN: 0.5000 mg | INTRAMUSCULAR | Status: DC | PRN

## 2017-04-15 MED ORDER — HYDROMORPHONE HCL 1 MG/ML IJ SOLN
0.5000 mg | INTRAMUSCULAR | Status: AC | PRN
  Administered 2017-04-15 (×4): 0.5 mg via INTRAVENOUS

## 2017-04-15 MED ORDER — SIMETHICONE 80 MG PO CHEW
80.0000 mg | CHEWABLE_TABLET | Freq: Four times a day (QID) | ORAL | Status: DC | PRN
  Administered 2017-04-15: 80 mg via ORAL
  Filled 2017-04-15 (×2): qty 1

## 2017-04-15 MED ORDER — LACTATED RINGERS IV SOLN
INTRAVENOUS | Status: DC
  Administered 2017-04-15: 02:00:00 via INTRAVENOUS

## 2017-04-15 MED ORDER — ONDANSETRON HCL 4 MG PO TABS: 4.0000 mg | ORAL_TABLET | Freq: Four times a day (QID) | ORAL | Status: DC | PRN
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
## 2017-04-15 NOTE — Discharge Summary (Signed)
Physician Discharge Summary  Patient ID: Debra Sullivan MRN: [id number] DOB/AGE: March 30, 1986 30 y.o.  Admit date: 04/14/2017 Discharge date: 04/15/2017  Admission Diagnoses: suspected ruptured ectopic pregnancy  Discharge Diagnoses:  Active Problems:   Ruptured ectopic pregnancy   Discharged Condition: good  Hospital Course: Please see HPI dated 04/14/2017 for full details. Briefly, this is a 31 y.o. [id number] female admitted for suspected ruptured ectopic pregnancy based on significant US findings. She underwent an urgent laparoscopic right salpingectomy and evacuation of hemoperitoneum. She was transfused 2 u pRBCs for low H/H and approx 800 mL blood in abdomen. She was kept overnight for monitoring. Her am H/H was 8.4/24.5 and she reports feeling much better. Pain is much improved and she is eating, ambulating, passing flatus without issue. She will see SW prior to discharge today on POD#1. She is discharged home good condition. She will f/u in office  Medical history significant for cocaine, THC use,   Physical exam  Vitals:   04/15/17 0200 04/15/17 0300 04/15/17 0510 04/15/17 0800  BP: 128/79  119/65 108/64  Pulse: 85  95 95  Resp: 16  16 16   Temp: 98 F (36.7 C)  98.2 F (36.8 C) 98.4 F (36.9 C)  TempSrc: Oral  Oral Oral  SpO2: 99% 99% 99% 100%  Weight:      Height:       General: alert, cooperative and no distress Lochia: appropriate Uterine Fundus: abdomen soft, appropriately tender Incision: Dressing is clean, dry, and intact DVT Evaluation: No evidence of DVT seen on physical exam. Labs: Lab Results  Component Value Date   WBC 11.1 (H) 04/15/2017   HGB 8.4 (L) 04/15/2017   HCT 24.5 (L) 04/15/2017   MCV 76.3 (L) 04/15/2017   PLT 245 04/15/2017   CMP Latest Ref Rng & Units 09/03/2015  Glucose 65 - 99 mg/dL 83  BUN 6 - 20 mg/dL 11  Creatinine 2.95 - 6.21 mg/dL 3.08  Sodium 657 - 846 mmol/L 135  Potassium 3.5 - 5.1 mmol/L 3.3(L)  Chloride 101 - 111  mmol/L 105  CO2 22 - 32 mmol/L 25  Calcium 8.9 - 10.3 mg/dL 9.6(E)  Total Protein 6.5 - 8.1 g/dL 7.9  Total Bilirubin 0.3 - 1.2 mg/dL 0.7  Alkaline Phos 38 - 126 U/L 40  AST 15 - 41 U/L 17  ALT 14 - 54 U/L 13(L)      Disposition: 01-Home or Self Care  Discharge Instructions    Diet - low sodium heart healthy    Complete by:  As directed    Increase activity slowly    Complete by:  As directed      An After Visit Summary was printed and given to the patient. Allergies as of 04/15/2017      Reactions   Naproxen Anaphylaxis      Medication List    STOP taking these medications   acetaminophen 325 MG tablet Commonly known as:  TYLENOL   amoxicillin 500 MG capsule Commonly known as:  AMOXIL   ibuprofen 200 MG tablet Commonly known as:  ADVIL,MOTRIN   levonorgestrel 0.75 MG tablet Commonly known as:  PLAN B   metroNIDAZOLE 500 MG tablet Commonly known as:  FLAGYL   mupirocin ointment 2 % Commonly known as:  BACTROBAN     TAKE these medications   oxyCODONE 5 MG immediate release tablet Commonly known as:  ROXICODONE Take 1 tablet (5 mg total) by mouth every 4 (four) hours as needed for severe pain.  VITAFOL GUMMIES 3.33-0.333-34.8 MG Chew Chew 3 tablets by mouth daily.      Follow-up Information    Conan Bowensavis, Kelly M, MD. Schedule an appointment as soon as possible for a visit in 2 week(s).   Specialty:  Obstetrics and Gynecology Contact information: [address] [place] [address] [phone]           Follow-up Information    Conan Bowensavis, Kelly M, MD. Schedule an appointment as soon as possible for a visit in 2 week(s).   Specialty:  Obstetrics and Gynecology Contact information: [address] [place] [address] [phone]           Signed: Conan BowensKelly M Davis 04/15/2017, 8:36 AM

## 2017-04-15 NOTE — Discharge Instructions (Signed)
Laparoscopic Surgery Discharge Instructions  Instructions Following Laparoscopic Surgery You have just undergone a laparoscopic surgery.  The following list should answer your most common questions.  Although we will discuss your surgery and post-operative instructions with you prior to your discharge, this list will serve as a reminder if you fail to recall the details of what we discussed.  We will discuss your surgery once again in detail at your post-op visit in two to four weeks. If you haven't already done so, please call to make your appointment as soon as possible.  How you will feel: Although you have just undergone a major surgery, your recovery will be significantly shorter since the surgery was performed through much smaller incisions than the traditional approach.  You should feel slightly better each day.  If you suddenly feel much worse than the prior day, please call the clinic.  It's important during the early part of your recovery that you maintain some activity.  Walking is encouraged.  You will quicken your recovery by continued activity.  Incision:  Your incisions will be closed with dissolvable stitches or surgical adhesive (glue).  There may be Band-aids and/or Steri-strips covering your incisions.  If there is no drainage from the incisions you may remove the Band-aids in one to two days.  You may notice some minor bruising at the incision sites.  This is common and will resolve within several days.  Please inform us if the redness at the edges of your incision appears to be spreading.  If the skin around your incision becomes warm to the touch, or if you notice a pus-like drainage, please call the office.  Stairs/Driving/Activities: You may climb stairs if necessary.  If you've had general anesthesia, do not drive a car the rest of the day today.  You may begin light housework when you feel up to it, but avoid heavy lifting (more than 15-20lbs) or pushing until cleared for these  activities by your physician.  Hygiene:  Do not soak your incisions.  Showers are acceptable but you may not take a bath or swim in a pool.  Cleanse your incisions daily with soap and water.  Medications:  Please resume taking any medications that you were taking prior to the surgery.  If we have prescribed any new medications for you, please take them as directed.  Constipation:  It is fairly common to experience some difficulty in moving your bowels following major surgery.  Being active will help to reduce this likelihood. A diet rich in fiber and plenty of liquids is desirable.  If you do become constipated, a mild laxative such as Miralax, Milk of Magnesia, or Metamucil, or a stool softener such as Colace, is recommended.  General Instructions: If you develop a fever of 100.5 degrees or higher, please call the office number(s) below for physician on call.    

## 2017-04-15 NOTE — Progress Notes (Signed)
Discharge instructions given to pt. Pt verbalized understanding. IVs taken out and tolerated well. Pt being wheeled down in stable condition.

## 2017-04-15 NOTE — Plan of Care (Signed)
Problem: Respiratory: Goal: Ability to maintain adequate ventilation will improve Outcome: Progressing Patient is presently on 02@2LNP  to maintain sat >=94

## 2017-04-16 LAB — HEPATITIS C ANTIBODY

## 2017-04-16 LAB — GC/CHLAMYDIA PROBE AMP (~~LOC~~) NOT AT ARMC
Chlamydia: NEGATIVE
Neisseria Gonorrhea: NEGATIVE
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
## 2017-04-17 ENCOUNTER — Encounter (HOSPITAL_COMMUNITY): Payer: Self-pay | Admitting: Obstetrics and Gynecology

## 2017-04-17 NOTE — Anesthesia Postprocedure Evaluation (Signed)
Anesthesia Post Note  Patient: Debra MaineBridget A Dax  Procedure(s) Performed: LAPAROSCOPY OPERATIVE WITH RIGHT SALPINGECTOMY (N/A Abdomen)     Patient location during evaluation: PACU Anesthesia Type: General Level of consciousness: sedated and patient cooperative Pain management: pain level controlled Vital Signs Assessment: post-procedure vital signs reviewed and stable Respiratory status: spontaneous breathing Cardiovascular status: stable Anesthetic complications: no    Last Vitals:  Vitals:   04/15/17 0800 04/15/17 1200  BP: 108/64 111/61  Pulse: 95 (!) 103  Resp: 16 (!) 106  Temp: 36.9 C (!) 36.3 C  SpO2: 100% 99%    Last Pain:  Vitals:   04/15/17 1200  TempSrc: Oral  PainSc: 0-No pain                 Lewie LoronJohn Orla Jolliff

## 2017-04-18 LAB — BPAM RBC
BLOOD PRODUCT EXPIRATION DATE: 201811072359
BLOOD PRODUCT EXPIRATION DATE: 201811092359
BLOOD PRODUCT EXPIRATION DATE: 201811112359
Blood Product Expiration Date: 201811072359
ISSUE DATE / TIME: 201810272130
ISSUE DATE / TIME: 201810272130
UNIT TYPE AND RH: 5100
Unit Type and Rh: 5100
Unit Type and Rh: 600
Unit Type and Rh: 6200

## 2017-04-18 LAB — TYPE AND SCREEN
ABO/RH(D): A POS
Antibody Screen: NEGATIVE
UNIT DIVISION: 0
UNIT DIVISION: 0
UNIT DIVISION: 0
Unit division: 0

## 2017-05-06 ENCOUNTER — Other Ambulatory Visit: Payer: Self-pay | Admitting: Advanced Practice Midwife

## 2017-05-07 ENCOUNTER — Encounter: Payer: Self-pay | Admitting: Obstetrics and Gynecology

## 2017-05-07 ENCOUNTER — Ambulatory Visit (INDEPENDENT_AMBULATORY_CARE_PROVIDER_SITE_OTHER): Payer: Self-pay | Admitting: Obstetrics and Gynecology

## 2017-05-07 VITALS — BP 104/63 | HR 73 | Ht 65.0 in | Wt 148.3 lb

## 2017-05-07 DIAGNOSIS — Z3009 Encounter for other general counseling and advice on contraception: Secondary | ICD-10-CM

## 2017-05-07 DIAGNOSIS — Z309 Encounter for contraceptive management, unspecified: Secondary | ICD-10-CM

## 2017-05-07 DIAGNOSIS — Z9889 Other specified postprocedural states: Secondary | ICD-10-CM

## 2017-05-07 NOTE — Progress Notes (Signed)
Here for post op. Has resumed intercourse once without condoms.

## 2017-05-07 NOTE — Progress Notes (Signed)
GYNECOLOGY OFFICE VISIT NOTE  History:  31 y.o. [id number] here today for f/u from laparoscopic right salpingectomy for ruptured ectopic pregnancy. She received 2 units pRBCs while in hospital for anemia. She denies any abnormal vaginal discharge, bleeding, pelvic pain or other concerns. She is doing very well, reports normal appetite, bowel and bladder habits. Denies any further pain.  Past Medical History:  Diagnosis Date  . Anemia   . Drug abuse (HCC)   . Medical history non-contributory   . Psychosis (HCC) 2013    Past Surgical History:  Procedure Laterality Date  . CESAREAN SECTION    . LAPAROSCOPY OPERATIVE WITH RIGHT SALPINGECTOMY N/A 04/14/2017   Performed by Conan Bowensavis, Terriana Barreras M, MD at [facility]     Current Outpatient Medications:  .  ibuprofen (ADVIL,MOTRIN) 800 MG tablet, Take by mouth., Disp: , Rfl:  .  oxyCODONE (ROXICODONE) 5 MG immediate release tablet, Take 1 tablet (5 mg total) by mouth every 4 (four) hours as needed for severe pain., Disp: 20 tablet, Rfl: 0  The following portions of the patient's history were reviewed and updated as appropriate: allergies, current medications, past family history, past medical history, past social history, past surgical history and problem list.   Health Maintenance:  Last pap: negative 09/2015 Last mammogram: n/a  Review of Systems:  Pertinent items noted in HPI and remainder of comprehensive ROS otherwise negative.   Objective:  Physical Exam BP 104/63   Pulse 73   Ht 5\' 5"  (1.651 m)   Wt 148 lb 4.8 oz (67.3 kg)   LMP 03/08/2017   Breastfeeding? Unknown   BMI 24.68 kg/m  CONSTITUTIONAL: Well-developed, well-nourished female in no acute distress.  HENT:  Normocephalic, atraumatic. External right and left ear normal. Oropharynx is clear and moist EYES: Conjunctivae and EOM are normal. Pupils are equal, round, and reactive to light. No scleral icterus.  NECK: Normal range of motion, supple, no masses SKIN: Skin is warm and  dry. No rash noted. Not diaphoretic. No erythema. No pallor. NEUROLOGIC: Alert and oriented to person, place, and time. Normal reflexes, muscle tone coordination. No cranial nerve deficit noted. PSYCHIATRIC: Normal mood and affect. Normal behavior. Normal judgment and thought content. CARDIOVASCULAR: Normal heart rate noted RESPIRATORY: Effort normal, no problems with respiration noted ABDOMEN: Soft, no distention noted.  Well healed incisions at port sites PELVIC: Deferred MUSCULOSKELETAL: Normal range of motion. No edema noted.  Labs and Imaging [place] Ob Comp Less 14 Wks  Result Date: 04/14/2017 CLINICAL DATA:  Pelvic pain. Gestational age by LMP of 5 weeks 2 days. EXAM: OBSTETRIC <14 WK US AND TRANSVAGINAL OB US TECHNIQUE: Both transabdominal and transvaginal ultrasound examinations were performed for complete evaluation of the gestation as well as the maternal uterus, adnexal regions, and pelvic cul-de-sac. Transvaginal technique was performed to assess early pregnancy. COMPARISON:  None. FINDINGS: Intrauterine gestational sac: None Maternal uterus/adnexae: A complex hypoechoic mass is seen in the right adnexa which measures 6.5 x 4.7 x 6.8 cm. This appears separate from the ovaries. A moderate amount of complex free fluid is also seen. IMPRESSION: No IUP visualized. 6.8 cm right adnexal mass with moderate amount of free fluid, consistent with ruptured ectopic pregnancy. Critical Value/emergent results were called by telephone at the time of interpretation on 04/14/2017 at 8:34 pm to the patient's nurse in MAU, who verbally acknowledged these results. Electronically Signed   By: Myles RosenthalJohn  Stahl M.D.   On: 04/14/2017 20:35    Assessment & Plan:  1. Encounter for  counseling regarding contraception Desires depo, had unprotected intercourse several days ago Return 2 weeks for depo  2. Post-operative state Reviewed findings with patient Doing well, no further f/u required Encouraged  contraception   Routine preventative health maintenance measures emphasized. Please refer to After Visit Summary for other counseling recommendations.   Return in about 2 weeks (around 05/21/2017) for Followup.    Baldemar LenisK. Meryl Holdyn Poyser, M.D. Attending Obstetrician & Gynecologist, [facility], [facility]

## 2017-06-07 ENCOUNTER — Encounter (HOSPITAL_COMMUNITY): Payer: Self-pay | Admitting: *Deleted

## 2017-06-07 ENCOUNTER — Inpatient Hospital Stay (HOSPITAL_COMMUNITY)
Admission: AD | Admit: 2017-06-07 | Discharge: 2017-06-08 | Disposition: A | Discharge status: Home / Self Care | Payer: Medicaid Other | Source: Ambulatory Visit | Attending: Family Medicine | Admitting: Family Medicine

## 2017-06-07 DIAGNOSIS — O3481 Maternal care for other abnormalities of pelvic organs, first trimester: Secondary | ICD-10-CM | POA: Insufficient documentation

## 2017-06-07 DIAGNOSIS — O99341 Other mental disorders complicating pregnancy, first trimester: Secondary | ICD-10-CM | POA: Insufficient documentation

## 2017-06-07 DIAGNOSIS — O09891 Supervision of other high risk pregnancies, first trimester: Secondary | ICD-10-CM | POA: Diagnosis not present

## 2017-06-07 DIAGNOSIS — O99331 Smoking (tobacco) complicating pregnancy, first trimester: Secondary | ICD-10-CM | POA: Insufficient documentation

## 2017-06-07 DIAGNOSIS — O283 Abnormal ultrasonic finding on antenatal screening of mother: Secondary | ICD-10-CM

## 2017-06-07 DIAGNOSIS — O26891 Other specified pregnancy related conditions, first trimester: Secondary | ICD-10-CM

## 2017-06-07 DIAGNOSIS — R109 Unspecified abdominal pain: Secondary | ICD-10-CM | POA: Diagnosis present

## 2017-06-07 DIAGNOSIS — Z3A01 Less than 8 weeks gestation of pregnancy: Secondary | ICD-10-CM | POA: Insufficient documentation

## 2017-06-07 DIAGNOSIS — F329 Major depressive disorder, single episode, unspecified: Secondary | ICD-10-CM | POA: Insufficient documentation

## 2017-06-07 DIAGNOSIS — O219 Vomiting of pregnancy, unspecified: Secondary | ICD-10-CM

## 2017-06-07 DIAGNOSIS — R112 Nausea with vomiting, unspecified: Secondary | ICD-10-CM | POA: Diagnosis present

## 2017-06-07 DIAGNOSIS — Z3491 Encounter for supervision of normal pregnancy, unspecified, first trimester: Secondary | ICD-10-CM

## 2017-06-07 DIAGNOSIS — R45851 Suicidal ideations: Secondary | ICD-10-CM | POA: Insufficient documentation

## 2017-06-07 DIAGNOSIS — F17210 Nicotine dependence, cigarettes, uncomplicated: Secondary | ICD-10-CM | POA: Diagnosis not present

## 2017-06-07 DIAGNOSIS — O218 Other vomiting complicating pregnancy: Secondary | ICD-10-CM | POA: Insufficient documentation

## 2017-06-07 DIAGNOSIS — N8312 Corpus luteum cyst of left ovary: Secondary | ICD-10-CM | POA: Insufficient documentation

## 2017-06-07 DIAGNOSIS — O0911 Supervision of pregnancy with history of ectopic or molar pregnancy, first trimester: Secondary | ICD-10-CM | POA: Diagnosis not present

## 2017-06-07 DIAGNOSIS — Z8659 Personal history of other mental and behavioral disorders: Secondary | ICD-10-CM

## 2017-06-07 LAB — POCT PREGNANCY, URINE: PREG TEST UR: POSITIVE — AB

## 2017-06-07 LAB — CBC
HEMATOCRIT: 36.1 % (ref 36.0–46.0)
Hemoglobin: 12.1 g/dL (ref 12.0–15.0)
MCH: 24.9 pg — AB (ref 26.0–34.0)
MCHC: 33.5 g/dL (ref 30.0–36.0)
MCV: 74.4 fL — AB (ref 78.0–100.0)
Platelets: 320 10*3/uL (ref 150–400)
RBC: 4.85 MIL/uL (ref 3.87–5.11)
RDW: 14.3 % (ref 11.5–15.5)
WBC: 9.4 10*3/uL (ref 4.0–10.5)

## 2017-06-07 LAB — RAPID URINE DRUG SCREEN, HOSP PERFORMED
AMPHETAMINES: NOT DETECTED
BENZODIAZEPINES: NOT DETECTED
Barbiturates: NOT DETECTED
COCAINE: NOT DETECTED
OPIATES: NOT DETECTED
TETRAHYDROCANNABINOL: POSITIVE — AB

## 2017-06-07 LAB — URINALYSIS, ROUTINE W REFLEX MICROSCOPIC
BILIRUBIN URINE: NEGATIVE
GLUCOSE, UA: NEGATIVE mg/dL
Ketones, ur: NEGATIVE mg/dL
NITRITE: NEGATIVE
PH: 5 (ref 5.0–8.0)
Protein, ur: NEGATIVE mg/dL
SPECIFIC GRAVITY, URINE: 1.019 (ref 1.005–1.030)

## 2017-06-07 MED ORDER — PROMETHAZINE HCL 25 MG PO TABS
12.5000 mg | ORAL_TABLET | Freq: Once | ORAL | Status: AC
  Administered 2017-06-08: 12.5 mg via ORAL
  Filled 2017-06-07: qty 1

## 2017-06-07 NOTE — MAU Note (Addendum)
PT  SAYS SHE HAD AN ECTOPIC  IN NOV.   AND  SINCE  NO CYCLE.    VOMITING STARTED  THIS AM, LIGHT HEADED.   NO HPT.   NO BIRTH CONTROL.  LAST SEX- YESTERDAY.   FEELS  CRAMPS
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
## 2017-06-08 ENCOUNTER — Encounter (HOSPITAL_COMMUNITY): Payer: Self-pay

## 2017-06-08 ENCOUNTER — Inpatient Hospital Stay (HOSPITAL_COMMUNITY): Payer: Medicaid Other

## 2017-06-08 DIAGNOSIS — O219 Vomiting of pregnancy, unspecified: Secondary | ICD-10-CM

## 2017-06-08 DIAGNOSIS — R45851 Suicidal ideations: Secondary | ICD-10-CM

## 2017-06-08 DIAGNOSIS — O283 Abnormal ultrasonic finding on antenatal screening of mother: Secondary | ICD-10-CM

## 2017-06-08 DIAGNOSIS — O26891 Other specified pregnancy related conditions, first trimester: Secondary | ICD-10-CM

## 2017-06-08 DIAGNOSIS — R109 Unspecified abdominal pain: Secondary | ICD-10-CM

## 2017-06-08 DIAGNOSIS — Z8659 Personal history of other mental and behavioral disorders: Secondary | ICD-10-CM

## 2017-06-08 LAB — HIV ANTIBODY (ROUTINE TESTING W REFLEX): HIV Screen 4th Generation wRfx: NONREACTIVE

## 2017-06-08 LAB — GC/CHLAMYDIA PROBE AMP (~~LOC~~) NOT AT ARMC
CHLAMYDIA, DNA PROBE: NEGATIVE
NEISSERIA GONORRHEA: NEGATIVE

## 2017-06-08 LAB — WET PREP, GENITAL
Clue Cells Wet Prep HPF POC: NONE SEEN
SPERM: NONE SEEN
Trich, Wet Prep: NONE SEEN
Yeast Wet Prep HPF POC: NONE SEEN

## 2017-06-08 LAB — HCG, QUANTITATIVE, PREGNANCY: HCG, BETA CHAIN, QUANT, S: 45716 m[IU]/mL — AB (ref <5)

## 2017-06-08 MED ORDER — PROMETHAZINE HCL 25 MG PO TABS: 12.5000 mg | ORAL_TABLET | Freq: Four times a day (QID) | ORAL | 5 refills | Status: DC | PRN

## 2017-06-08 MED ORDER — PROMETHAZINE HCL 25 MG PO TABS: 12.5000 mg | ORAL_TABLET | Freq: Four times a day (QID) | ORAL | Status: DC | PRN

## 2017-06-08 NOTE — Discharge Instructions (Signed)
[facility]    [facility]       Phone: [phone]  [facility] [place] Phone: [phone]  [facility] [place]  Phone: [phone]  [facility] [place]  Phone: [phone]  [facility] [place]  Phone: [phone]  [facility]       Phone: [phone]  [facility]    Phone: [phone]   [facility]    Phone: [phone]  [facility]    Phone: [phone]  [facility]    Phone: [phone]  [facility]    Phone: [phone]  [facility]       Phone: [phone]   [facility]  Phone: [phone]  [facility]    Phone: [phone]  [facility] [place]   Phone: [phone]  [facility]      Phone: [phone]  [facility]    Phone: [phone]  Safe Medications in Pregnancy   Acne: Benzoyl Peroxide Salicylic Acid  Backache/Headache: Tylenol: 2 regular strength every 4 hours OR              2 Extra strength every 6 hours  Colds/Coughs/Allergies: Benadryl (alcohol free) 25 mg every 6 hours as needed Breath right strips Claritin Cepacol throat lozenges Chloraseptic throat spray Cold-Eeze- up to three times per day Cough drops, alcohol free Flonase (by prescription only) Guaifenesin Mucinex Robitussin DM (plain only, alcohol free) Saline nasal spray/drops Sudafed (pseudoephedrine) & Actifed ** use only after [redacted] weeks gestation and if you do not have high blood pressure Tylenol Vicks Vaporub Zinc lozenges Zyrtec   Constipation: Colace Ducolax suppositories Fleet enema Glycerin suppositories Metamucil Milk of  magnesia Miralax Senokot Smooth move tea  Diarrhea: Kaopectate Imodium A-D  *NO pepto Bismol  Hemorrhoids: Anusol Anusol HC Preparation H Tucks  Indigestion: Tums Maalox Mylanta Zantac  Pepcid  Insomnia: Benadryl (alcohol free) 25mg  every 6 hours as needed Tylenol PM Unisom, no Gelcaps  Leg Cramps: Tums MagGel  Nausea/Vomiting:  Bonine Dramamine Emetrol Ginger extract Sea bands Meclizine  Nausea medication to take during pregnancy:  Unisom (doxylamine succinate 25 mg tablets) Take one tablet daily at bedtime. If symptoms are not adequately controlled, the dose can be increased to a maximum recommended dose of two tablets daily (1/2 tablet in the morning, 1/2 tablet mid-afternoon and one at bedtime). Vitamin B6 100mg  tablets. Take one tablet twice a day (up to 200 mg per day).  Skin Rashes: Aveeno products Benadryl cream or 25mg  every 6 hours as needed Calamine Lotion 1% cortisone cream  Yeast infection: Gyne-lotrimin 7 Monistat 7   **If taking multiple medications, please check labels to avoid duplicating the same active ingredients **take medication as directed on the label ** Do not exceed 4000 mg of tylenol in 24 hours **Do not take medications that contain aspirin or ibuprofen     Morning Sickness Morning sickness is when you feel sick to your stomach (nauseous) during pregnancy. You may feel sick to your stomach and throw up (vomit). You may feel sick in the morning, but you can feel this way any time of day. Some women feel very sick to their stomach and cannot stop throwing up (hyperemesis gravidarum). Follow these instructions at home:  Only take medicines as told by your doctor.  Take multivitamins as told  by your doctor. Taking multivitamins before getting pregnant can stop or lessen the harshness of morning sickness.  Eat dry toast or unsalted crackers before getting out of bed.  Eat 5 to 6 small meals a day.  Eat dry and bland  foods like rice and baked potatoes.  Do not drink liquids with meals. Drink between meals.  Do not eat greasy, fatty, or spicy foods.  Have someone cook for you if the smell of food causes you to feel sick or throw up.  If you feel sick to your stomach after taking prenatal vitamins, take them at night or with a snack.  Eat protein when you need a snack (nuts, yogurt, cheese).  Eat unsweetened gelatins for dessert.  Wear a bracelet used for sea sickness (acupressure wristband).  Go to a doctor that puts thin needles into certain body points (acupuncture) to improve how you feel.  Do not smoke.  Use a humidifier to keep the air in your house free of odors.  Get lots of fresh air. Contact a doctor if:  You need medicine to feel better.  You feel dizzy or lightheaded.  You are losing weight. Get help right away if:  You feel very sick to your stomach and cannot stop throwing up.  You pass out (faint). This information is not intended to replace advice given to you by your health care provider. Make sure you discuss any questions you have with your health care provider. Document Released: 07/13/2004 Document Revised: 11/11/2015 Document Reviewed: 11/20/2012 Elsevier Interactive Patient Education  2017 [employer]
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
## 2017-06-08 NOTE — MAU Note (Signed)
Diet ordered 
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
## 2017-06-14 ENCOUNTER — Ambulatory Visit (HOSPITAL_COMMUNITY)
Admission: RE | Admit: 2017-06-14 | Discharge: 2017-06-14 | Disposition: A | Discharge status: Home / Self Care | Payer: Medicaid Other | Source: Ambulatory Visit | Attending: Advanced Practice Midwife | Admitting: Advanced Practice Midwife

## 2017-06-14 ENCOUNTER — Ambulatory Visit: Payer: Medicaid Other | Admitting: *Deleted

## 2017-06-14 ENCOUNTER — Encounter: Payer: Self-pay | Admitting: General Practice

## 2017-06-14 ENCOUNTER — Telehealth: Payer: Self-pay | Admitting: *Deleted

## 2017-06-14 DIAGNOSIS — Z3A01 Less than 8 weeks gestation of pregnancy: Secondary | ICD-10-CM | POA: Diagnosis not present

## 2017-06-14 DIAGNOSIS — R109 Unspecified abdominal pain: Secondary | ICD-10-CM | POA: Diagnosis present

## 2017-06-14 DIAGNOSIS — N83202 Unspecified ovarian cyst, left side: Secondary | ICD-10-CM | POA: Diagnosis not present

## 2017-06-14 DIAGNOSIS — O26891 Other specified pregnancy related conditions, first trimester: Secondary | ICD-10-CM | POA: Diagnosis present

## 2017-06-14 DIAGNOSIS — Z3491 Encounter for supervision of normal pregnancy, unspecified, first trimester: Secondary | ICD-10-CM

## 2017-06-14 DIAGNOSIS — O3481 Maternal care for other abnormalities of pelvic organs, first trimester: Secondary | ICD-10-CM | POA: Insufficient documentation

## 2017-06-14 DIAGNOSIS — O283 Abnormal ultrasonic finding on antenatal screening of mother: Secondary | ICD-10-CM

## 2017-06-14 DIAGNOSIS — O208 Other hemorrhage in early pregnancy: Secondary | ICD-10-CM | POA: Insufficient documentation

## 2017-06-14 DIAGNOSIS — Z712 Person consulting for explanation of examination or test findings: Secondary | ICD-10-CM

## 2017-06-14 NOTE — Progress Notes (Signed)
Result reviewed with Dr Vergie LivingPickens. [facility] Patient left before being given results, see telephone note.

## 2017-06-14 NOTE — Telephone Encounter (Signed)
Patient left clinic without being seen to talk about u/s results. Called patient and gave results. Understanding voiced. Asked patient where she intended to get pnc but she said her phone was breaking up and she would have to call back.

## 2017-06-14 NOTE — Progress Notes (Signed)
Patient ID: Debra Sullivan, female   DOB: 07/21/1985, 31 y.o.   MRN: [id number] Patient approached my window upset because she had to wait for results of ultrasound.  Explained to the patient as soon as the results came in, an RN would call her to the back.  She stated that she could not stay because she was sick and she had been here all day.  I asked patient if her phone was correct so someone could give her a call with results as requested by patient.

## 2017-06-19 NOTE — L&D Delivery Note (Addendum)
Delivery Note At 12:37 AM a viable and healthy female was delivered via Vaginal, Spontaneous (Presentation: ROA ;  ).  APGAR: 7, 9; weight pending .   Placenta status: spontaneous and intact , .  Cord: 3 vessel with the following complications: none.  Anesthesia:  epidural Episiotomy: None Lacerations: None Suture Repair: n/a Est. Blood Loss (mL): 100  Mom to postpartum.  Baby to Couplet care / Skin to Skin.   Margarita RanaHarrison D Rose 01/22/2018, 12:59 AM  Lucila MaineBridget A Sabourin is a 32 y.o. female [phone] with IUP at 7088w4d admitted for SROM and active labor .  She progressed without augmentation to complete and pushed less than 30 minutes to deliver.  Cord clamping delayed by 1 minute then clamped by CNM and cut by FOB.  Placenta intact and spontaneous, bleeding minimal.  Intact perineum.  Mom and baby stable prior to transfer to postpartum. She plans on breastfeeding. She requests BTL for birth control.

## 2017-07-11 ENCOUNTER — Ambulatory Visit (INDEPENDENT_AMBULATORY_CARE_PROVIDER_SITE_OTHER): Payer: Medicaid Other | Admitting: Obstetrics and Gynecology

## 2017-07-11 ENCOUNTER — Encounter: Payer: Self-pay | Admitting: *Deleted

## 2017-07-11 ENCOUNTER — Other Ambulatory Visit (HOSPITAL_COMMUNITY)
Admission: RE | Admit: 2017-07-11 | Discharge: 2017-07-11 | Disposition: A | Discharge status: Home / Self Care | Payer: Medicaid Other | Source: Ambulatory Visit | Attending: Obstetrics and Gynecology | Admitting: Obstetrics and Gynecology

## 2017-07-11 ENCOUNTER — Encounter: Payer: Self-pay | Admitting: Obstetrics and Gynecology

## 2017-07-11 VITALS — BP 108/71 | HR 92 | Wt 150.2 lb

## 2017-07-11 DIAGNOSIS — Z3480 Encounter for supervision of other normal pregnancy, unspecified trimester: Secondary | ICD-10-CM | POA: Diagnosis present

## 2017-07-11 DIAGNOSIS — Z3481 Encounter for supervision of other normal pregnancy, first trimester: Secondary | ICD-10-CM

## 2017-07-11 DIAGNOSIS — Z3009 Encounter for other general counseling and advice on contraception: Secondary | ICD-10-CM

## 2017-07-11 DIAGNOSIS — F323 Major depressive disorder, single episode, severe with psychotic features: Secondary | ICD-10-CM

## 2017-07-11 DIAGNOSIS — Z98891 History of uterine scar from previous surgery: Secondary | ICD-10-CM

## 2017-07-11 DIAGNOSIS — F1421 Cocaine dependence, in remission: Secondary | ICD-10-CM

## 2017-07-11 DIAGNOSIS — O34219 Maternal care for unspecified type scar from previous cesarean delivery: Secondary | ICD-10-CM | POA: Insufficient documentation

## 2017-07-11 DIAGNOSIS — F1220 Cannabis dependence, uncomplicated: Secondary | ICD-10-CM

## 2017-07-11 MED ORDER — PREPLUS 27-1 MG PO TABS: 1.0000 | ORAL_TABLET | Freq: Every day | ORAL | 13 refills | Status: DC

## 2017-07-11 NOTE — Addendum Note (Signed)
Addended by: Natale MilchSTALLING, Yarieliz Wasser D on: 07/11/2017 03:11 PM   Modules accepted: Orders

## 2017-07-11 NOTE — Progress Notes (Signed)
Subjective:  Debra Sullivan is a 32 y.o. [phone] at 1833w5d being seen today for her first OB visit. EDD by first trimester U/S. H/O C section in 2017. Ectopic pregnancy in 03/2017. H/O drug use. Pt denies use for the last 6 months. H/O depression/psychosis. [facility] She discontinue her medication about 3 weeks ago.   She is currently monitored for the following issues for this high-risk pregnancy and has Psychosis (HCC); MDD (major depressive disorder), single episode, severe with psychosis (HCC); Cannabis use disorder, severe, dependence (HCC); PTSD (post-traumatic stress disorder); Cocaine use disorder, moderate, in early remission (HCC); Supervision of other normal pregnancy, antepartum; History of cesarean section; and Unwanted fertility on their problem list.  Patient reports no complaints.  Contractions: Not present. Vag. Bleeding: None.   . Denies leaking of fluid.   The following portions of the patient's history were reviewed and updated as appropriate: allergies, current medications, past family history, past medical history, past social history, past surgical history and problem list. Problem list updated.  Objective:   Vitals:   07/11/17 1338  BP: 108/71  Pulse: 92  Weight: 150 lb 3.2 oz (68.1 kg)    Fetal Status: Fetal Heart Rate (bpm): 160         General:  Alert, oriented and cooperative. Patient is in no acute distress.  Skin: Skin is warm and dry. No rash noted.   Cardiovascular: Normal heart rate noted  Respiratory: Normal respiratory effort, no problems with respiration noted  Abdomen: Soft, gravid, appropriate for gestational age. Pain/Pressure: Absent     Pelvic:  Cervical exam performed        Extremities: Normal range of motion.  Edema: None  Mental Status: Normal mood and affect. Normal behavior. Normal judgment and thought content.   Urinalysis:      Assessment and Plan:  Pregnancy: [id number] at 9333w5d  1. Supervision of other normal pregnancy,  antepartum Prenatal care and labs reviewed with pt. UDS discussed with pt. Consent obtained - Cytology - PAP - Culture, OB Urine - Obstetric Panel, Including HIV - Genetic Screening - Hemoglobinopathy evaluation - ToxASSURE Select 13 (MW), Urine  2. History of cesarean section Will discuss TOLAC vs RLTCS at later appts  3. Cocaine use disorder, moderate, in early remission (HCC) UDS today  4. Cannabis use disorder, severe, dependence (HCC) UDS today  5. MDD (major depressive disorder), single episode, severe with psychosis (HCC) Pt advised to follow up with [facility] in regards to medications and new Dx of pregnancy.  6. Unwanted fertility Will need to sign papers @ 28 weeks  Preterm labor symptoms and general obstetric precautions including but not limited to vaginal bleeding, contractions, leaking of fluid and fetal movement were reviewed in detail with the patient. Please refer to After Visit Summary for other counseling recommendations.  Return in about 4 weeks (around 08/08/2017) for OB visit.   Hermina StaggersErvin, Michael L, MD

## 2017-07-11 NOTE — Progress Notes (Signed)
Patient is in the office for initial ob visit. Pt states this is an unplanned pregnancy, fob is involved.

## 2017-07-11 NOTE — Patient Instructions (Signed)
First Trimester of Pregnancy The first trimester of pregnancy is from week 1 until the end of week 13 (months 1 through 3). A week after a sperm fertilizes an egg, the egg will implant on the wall of the uterus. This embryo will begin to develop into a baby. Genes from you and your partner will form the baby. The female genes will determine whether the baby will be a boy or a girl. At 6-8 weeks, the eyes and face will be formed, and the heartbeat can be seen on ultrasound. At the end of 12 weeks, all the baby's organs will be formed. Now that you are pregnant, you will want to do everything you can to have a healthy baby. Two of the most important things are to get good prenatal care and to follow your health care provider's instructions. Prenatal care is all the medical care you receive before the baby's birth. This care will help prevent, find, and treat any problems during the pregnancy and childbirth. Body changes during your first trimester Your body goes through many changes during pregnancy. The changes vary from woman to woman.  You may gain or lose a couple of pounds at first.  You may feel sick to your stomach (nauseous) and you may throw up (vomit). If the vomiting is uncontrollable, call your health care provider.  You may tire easily.  You may develop headaches that can be relieved by medicines. All medicines should be approved by your health care provider.  You may urinate more often. Painful urination may mean you have a bladder infection.  You may develop heartburn as a result of your pregnancy.  You may develop constipation because certain hormones are causing the muscles that push stool through your intestines to slow down.  You may develop hemorrhoids or swollen veins (varicose veins).  Your breasts may begin to grow larger and become tender. Your nipples may stick out more, and the tissue that surrounds them (areola) may become darker.  Your gums may bleed and may be  sensitive to brushing and flossing.  Dark spots or blotches (chloasma, mask of pregnancy) may develop on your face. This will likely fade after the baby is born.  Your menstrual periods will stop.  You may have a loss of appetite.  You may develop cravings for certain kinds of food.  You may have changes in your emotions from day to day, such as being excited to be pregnant or being concerned that something may go wrong with the pregnancy and baby.  You may have more vivid and strange dreams.  You may have changes in your hair. These can include thickening of your hair, rapid growth, and changes in texture. Some women also have hair loss during or after pregnancy, or hair that feels dry or thin. Your hair will most likely return to normal after your baby is born.  What to expect at prenatal visits During a routine prenatal visit:  You will be weighed to make sure you and the baby are growing normally.  Your blood pressure will be taken.  Your abdomen will be measured to track your baby's growth.  The fetal heartbeat will be listened to between weeks 10 and 14 of your pregnancy.  Test results from any previous visits will be discussed.  Your health care provider may ask you:  How you are feeling.  If you are feeling the baby move.  If you have had any abnormal symptoms, such as leaking fluid, bleeding, severe headaches,   or abdominal cramping.  If you are using any tobacco products, including cigarettes, chewing tobacco, and electronic cigarettes.  If you have any questions.  Other tests that may be performed during your first trimester include:  Blood tests to find your blood type and to check for the presence of any previous infections. The tests will also be used to check for low iron levels (anemia) and protein on red blood cells (Rh antibodies). Depending on your risk factors, or if you previously had diabetes during pregnancy, you may have tests to check for high blood  sugar that affects pregnant women (gestational diabetes).  Urine tests to check for infections, diabetes, or protein in the urine.  An ultrasound to confirm the proper growth and development of the baby.  Fetal screens for spinal cord problems (spina bifida) and Down syndrome.  HIV (human immunodeficiency virus) testing. Routine prenatal testing includes screening for HIV, unless you choose not to have this test.  You may need other tests to make sure you and the baby are doing well.  Follow these instructions at home: Medicines  Follow your health care provider's instructions regarding medicine use. Specific medicines may be either safe or unsafe to take during pregnancy.  Take a prenatal vitamin that contains at least 600 micrograms (mcg) of folic acid.  If you develop constipation, try taking a stool softener if your health care provider approves. Eating and drinking  Eat a balanced diet that includes fresh fruits and vegetables, whole grains, good sources of protein such as meat, eggs, or tofu, and low-fat dairy. Your health care provider will help you determine the amount of weight gain that is right for you.  Avoid raw meat and uncooked cheese. These carry germs that can cause birth defects in the baby.  Eating four or five small meals rather than three large meals a day may help relieve nausea and vomiting. If you start to feel nauseous, eating a few soda crackers can be helpful. Drinking liquids between meals, instead of during meals, also seems to help ease nausea and vomiting.  Limit foods that are high in fat and processed sugars, such as fried and sweet foods.  To prevent constipation: ? Eat foods that are high in fiber, such as fresh fruits and vegetables, whole grains, and beans. ? Drink enough fluid to keep your urine clear or pale yellow. Activity  Exercise only as directed by your health care provider. Most women can continue their usual exercise routine during  pregnancy. Try to exercise for 30 minutes at least 5 days a week. Exercising will help you: ? Control your weight. ? Stay in shape. ? Be prepared for labor and delivery.  Experiencing pain or cramping in the lower abdomen or lower back is a good sign that you should stop exercising. Check with your health care provider before continuing with normal exercises.  Try to avoid standing for long periods of time. Move your legs often if you must stand in one place for a long time.  Avoid heavy lifting.  Wear low-heeled shoes and practice good posture.  You may continue to have sex unless your health care provider tells you not to. Relieving pain and discomfort  Wear a good support bra to relieve breast tenderness.  Take warm sitz baths to soothe any pain or discomfort caused by hemorrhoids. Use hemorrhoid cream if your health care provider approves.  Rest with your legs elevated if you have leg cramps or low back pain.  If you develop   varicose veins in your legs, wear support hose. Elevate your feet for 15 minutes, 3-4 times a day. Limit salt in your diet. Prenatal care  Schedule your prenatal visits by the twelfth week of pregnancy. They are usually scheduled monthly at first, then more often in the last 2 months before delivery.  Write down your questions. Take them to your prenatal visits.  Keep all your prenatal visits as told by your health care provider. This is important. Safety  Wear your seat belt at all times when driving.  Make a list of emergency phone numbers, including numbers for family, friends, the hospital, and police and fire departments. General instructions  Ask your health care provider for a referral to a local prenatal education class. Begin classes no later than the beginning of month 6 of your pregnancy.  Ask for help if you have counseling or nutritional needs during pregnancy. Your health care provider can offer advice or refer you to specialists for help  with various needs.  Do not use hot tubs, steam rooms, or saunas.  Do not douche or use tampons or scented sanitary pads.  Do not cross your legs for long periods of time.  Avoid cat litter boxes and soil used by cats. These carry germs that can cause birth defects in the baby and possibly loss of the fetus by miscarriage or stillbirth.  Avoid all smoking, herbs, alcohol, and medicines not prescribed by your health care provider. Chemicals in these products affect the formation and growth of the baby.  Do not use any products that contain nicotine or tobacco, such as cigarettes and e-cigarettes. If you need help quitting, ask your health care provider. You may receive counseling support and other resources to help you quit.  Schedule a dentist appointment. At home, brush your teeth with a soft toothbrush and be gentle when you floss. Contact a health care provider if:  You have dizziness.  You have mild pelvic cramps, pelvic pressure, or nagging pain in the abdominal area.  You have persistent nausea, vomiting, or diarrhea.  You have a bad smelling vaginal discharge.  You have pain when you urinate.  You notice increased swelling in your face, hands, legs, or ankles.  You are exposed to fifth disease or chickenpox.  You are exposed to German measles (rubella) and have never had it. Get help right away if:  You have a fever.  You are leaking fluid from your vagina.  You have spotting or bleeding from your vagina.  You have severe abdominal cramping or pain.  You have rapid weight gain or loss.  You vomit blood or material that looks like coffee grounds.  You develop a severe headache.  You have shortness of breath.  You have any kind of trauma, such as from a fall or a car accident. Summary  The first trimester of pregnancy is from week 1 until the end of week 13 (months 1 through 3).  Your body goes through many changes during pregnancy. The changes vary from  woman to woman.  You will have routine prenatal visits. During those visits, your health care provider will examine you, discuss any test results you may have, and talk with you about how you are feeling. This information is not intended to replace advice given to you by your health care provider. Make sure you discuss any questions you have with your health care provider. Document Released: 05/30/2001 Document Revised: 05/17/2016 Document Reviewed: 05/17/2016 Elsevier Interactive Patient Education  2018 Elsevier   Inc.  

## 2017-07-12 LAB — CYTOLOGY - PAP
CANDIDA VAGINITIS: POSITIVE — AB
Chlamydia: NEGATIVE
Diagnosis: NEGATIVE
HPV (WINDOPATH): NOT DETECTED
NEISSERIA GONORRHEA: NEGATIVE
Trichomonas: POSITIVE — AB

## 2017-07-13 ENCOUNTER — Other Ambulatory Visit: Payer: Self-pay

## 2017-07-13 ENCOUNTER — Encounter: Payer: Self-pay | Admitting: Obstetrics and Gynecology

## 2017-07-13 LAB — URINE CULTURE, OB REFLEX

## 2017-07-13 LAB — OBSTETRIC PANEL, INCLUDING HIV
Antibody Screen: NEGATIVE
Basophils Absolute: 0 10*3/uL (ref 0.0–0.2)
Basos: 0 %
EOS (ABSOLUTE): 0.1 10*3/uL (ref 0.0–0.4)
Eos: 1 %
HIV SCREEN 4TH GENERATION: NONREACTIVE
Hematocrit: 34.2 % (ref 34.0–46.6)
Hemoglobin: 10.8 g/dL — ABNORMAL LOW (ref 11.1–15.9)
Hepatitis B Surface Ag: NEGATIVE
IMMATURE GRANULOCYTES: 0 %
Immature Grans (Abs): 0 10*3/uL (ref 0.0–0.1)
LYMPHS ABS: 2.3 10*3/uL (ref 0.7–3.1)
Lymphs: 28 %
MCH: 24.3 pg — AB (ref 26.6–33.0)
MCHC: 31.6 g/dL (ref 31.5–35.7)
MCV: 77 fL — AB (ref 79–97)
MONOS ABS: 0.6 10*3/uL (ref 0.1–0.9)
Monocytes: 7 %
NEUTROS ABS: 5.3 10*3/uL (ref 1.4–7.0)
NEUTROS PCT: 64 %
Platelets: 368 10*3/uL (ref 150–379)
RBC: 4.44 x10E6/uL (ref 3.77–5.28)
RDW: 15 % (ref 12.3–15.4)
RPR Ser Ql: NONREACTIVE
Rh Factor: POSITIVE
Rubella Antibodies, IGG: 0.97 index — ABNORMAL LOW (ref >0.99)
WBC: 8.3 10*3/uL (ref 3.4–10.8)

## 2017-07-13 LAB — HEMOGLOBINOPATHY EVALUATION
HEMOGLOBIN F QUANTITATION: 0 % (ref 0.0–2.0)
HGB A: 97.8 % (ref 96.4–98.8)
HGB C: 0 %
HGB S: 0 %
HGB VARIANT: 0 %
Hemoglobin A2 Quantitation: 2.2 % (ref 1.8–3.2)

## 2017-07-13 LAB — CULTURE, OB URINE

## 2017-07-13 MED ORDER — METRONIDAZOLE 500 MG PO TABS: 500.0000 mg | ORAL_TABLET | Freq: Two times a day (BID) | ORAL | 0 refills | Status: DC

## 2017-07-13 MED ORDER — TERCONAZOLE 0.4 % VA CREA: 1.0000 | TOPICAL_CREAM | Freq: Every day | VAGINAL | 0 refills | Status: DC

## 2017-07-17 LAB — TOXASSURE SELECT 13 (MW), URINE

## 2017-07-19 ENCOUNTER — Encounter: Payer: Self-pay | Admitting: Obstetrics and Gynecology

## 2017-07-19 LAB — CYSTIC FIBROSIS MUTATION 97: GENE DIS ANAL CARRIER INTERP BLD/T-IMP: NOT DETECTED

## 2017-08-01 ENCOUNTER — Encounter: Payer: Self-pay | Admitting: Obstetrics and Gynecology

## 2017-08-01 DIAGNOSIS — Z789 Other specified health status: Secondary | ICD-10-CM | POA: Insufficient documentation

## 2017-08-01 DIAGNOSIS — A599 Trichomoniasis, unspecified: Secondary | ICD-10-CM | POA: Insufficient documentation

## 2017-08-08 ENCOUNTER — Encounter: Payer: Self-pay | Admitting: Obstetrics and Gynecology

## 2017-08-14 ENCOUNTER — Ambulatory Visit (INDEPENDENT_AMBULATORY_CARE_PROVIDER_SITE_OTHER): Payer: Medicaid Other | Admitting: Obstetrics and Gynecology

## 2017-08-14 ENCOUNTER — Encounter: Payer: Self-pay | Admitting: Obstetrics and Gynecology

## 2017-08-14 ENCOUNTER — Other Ambulatory Visit (HOSPITAL_COMMUNITY)
Admission: RE | Admit: 2017-08-14 | Discharge: 2017-08-14 | Disposition: A | Discharge status: Home / Self Care | Payer: Medicaid Other | Source: Ambulatory Visit | Attending: Obstetrics and Gynecology | Admitting: Obstetrics and Gynecology

## 2017-08-14 VITALS — BP 114/71 | HR 92 | Wt 165.5 lb

## 2017-08-14 DIAGNOSIS — A599 Trichomoniasis, unspecified: Secondary | ICD-10-CM | POA: Insufficient documentation

## 2017-08-14 DIAGNOSIS — F1421 Cocaine dependence, in remission: Secondary | ICD-10-CM

## 2017-08-14 DIAGNOSIS — Z98891 History of uterine scar from previous surgery: Secondary | ICD-10-CM

## 2017-08-14 DIAGNOSIS — F1220 Cannabis dependence, uncomplicated: Secondary | ICD-10-CM

## 2017-08-14 DIAGNOSIS — Z3480 Encounter for supervision of other normal pregnancy, unspecified trimester: Secondary | ICD-10-CM

## 2017-08-14 DIAGNOSIS — Z3009 Encounter for other general counseling and advice on contraception: Secondary | ICD-10-CM

## 2017-08-14 DIAGNOSIS — F323 Major depressive disorder, single episode, severe with psychotic features: Secondary | ICD-10-CM

## 2017-08-14 NOTE — Progress Notes (Signed)
Subjective:  Debra Sullivan is a 32 y.o. [phone] at 7270w4d being seen today for ongoing prenatal care.  She is currently monitored for the following issues for this high-risk pregnancy and has Psychosis (HCC); MDD (major depressive disorder), single episode, severe with psychosis (HCC); Cannabis use disorder, severe, dependence (HCC); PTSD (post-traumatic stress disorder); Cocaine use disorder, moderate, in early remission (HCC); Supervision of other normal pregnancy, antepartum; History of cesarean section; Unwanted fertility; Trichomonosis; and Not immune to rubella on their problem list.  Patient reports no complaints.  Contractions: Not present. Vag. Bleeding: None.  Movement: Present. Denies leaking of fluid.   The following portions of the patient's history were reviewed and updated as appropriate: allergies, current medications, past family history, past medical history, past social history, past surgical history and problem list. Problem list updated.  Objective:   Vitals:   08/14/17 1527  BP: 114/71  Pulse: 92  Weight: 165 lb 8 oz (75.1 kg)    Fetal Status: Fetal Heart Rate (bpm): 148   Movement: Present     General:  Alert, oriented and cooperative. Patient is in no acute distress.  Skin: Skin is warm and dry. No rash noted.   Cardiovascular: Normal heart rate noted  Respiratory: Normal respiratory effort, no problems with respiration noted  Abdomen: Soft, gravid, appropriate for gestational age. Pain/Pressure: Absent     Pelvic:  Cervical exam deferred        Extremities: Normal range of motion.  Edema: None  Mental Status: Normal mood and affect. Normal behavior. Normal judgment and thought content.   Urinalysis:      Assessment and Plan:  Pregnancy: [id number] at 7170w4d  1. Unwanted fertility Will need to sign BTL papers at alter date  2. Trichomonosis TOC today - Cervicovaginal ancillary only  3. Supervision of other normal pregnancy, antepartum Stable Declined  Flu vaccine  AFP declined - US MFM OB COMP + 14 WK; Future  4. History of cesarean section Will need to discuss Repeat vs TOLAC at later appt  5. Cocaine use disorder, moderate, in early remission (HCC) UDS at last visit negative  6. Cannabis use disorder, severe, dependence (HCC) As above  7. MDD (major depressive disorder), single episode, severe with psychosis (HCC) [facility]  Preterm labor symptoms and general obstetric precautions including but not limited to vaginal bleeding, contractions, leaking of fluid and fetal movement were reviewed in detail with the patient. Please refer to After Visit Summary for other counseling recommendations.  Return in about 4 weeks (around 09/11/2017) for OB visit.   Hermina StaggersErvin, Esmeralda Blanford L, MD

## 2017-08-14 NOTE — Patient Instructions (Signed)

## 2017-08-14 NOTE — Progress Notes (Signed)
Patient reoprts feeling fetal movement, denies pain.

## 2017-08-15 LAB — CERVICOVAGINAL ANCILLARY ONLY
Bacterial vaginitis: NEGATIVE
CHLAMYDIA, DNA PROBE: NEGATIVE
Candida vaginitis: NEGATIVE
Neisseria Gonorrhea: NEGATIVE
Trichomonas: POSITIVE — AB

## 2017-08-16 ENCOUNTER — Other Ambulatory Visit: Payer: Self-pay

## 2017-08-16 ENCOUNTER — Telehealth: Payer: Self-pay

## 2017-08-16 MED ORDER — METRONIDAZOLE 500 MG PO TABS: 500.0000 mg | ORAL_TABLET | Freq: Two times a day (BID) | ORAL | 0 refills | Status: AC

## 2017-08-16 NOTE — Telephone Encounter (Signed)
-----   Message from Hermina StaggersMichael L Ervin, MD sent at 08/16/2017  8:39 AM EST ----- Please let pt know that she still has tric Flagyl 500 mg po bid x 7 days  [facility] with [facility] visit Partner needs to be seen and treated as well Thanks Casimiro NeedleMichael

## 2017-08-16 NOTE — Telephone Encounter (Signed)
Patient notified of results and Rx, partner to be treated and to abstain from sex.

## 2017-09-03 ENCOUNTER — Encounter (HOSPITAL_COMMUNITY): Payer: Self-pay | Admitting: Obstetrics and Gynecology

## 2017-09-07 ENCOUNTER — Ambulatory Visit (HOSPITAL_COMMUNITY)
Admission: RE | Admit: 2017-09-07 | Discharge: 2017-09-07 | Disposition: A | Discharge status: Home / Self Care | Payer: Medicaid Other | Source: Ambulatory Visit | Attending: Obstetrics and Gynecology | Admitting: Obstetrics and Gynecology

## 2017-09-07 DIAGNOSIS — N1330 Unspecified hydronephrosis: Secondary | ICD-10-CM | POA: Diagnosis not present

## 2017-09-07 DIAGNOSIS — F1920 Other psychoactive substance dependence, uncomplicated: Secondary | ICD-10-CM | POA: Diagnosis not present

## 2017-09-07 DIAGNOSIS — Z3480 Encounter for supervision of other normal pregnancy, unspecified trimester: Secondary | ICD-10-CM | POA: Diagnosis present

## 2017-09-07 DIAGNOSIS — O9989 Other specified diseases and conditions complicating pregnancy, childbirth and the puerperium: Secondary | ICD-10-CM | POA: Diagnosis not present

## 2017-09-07 DIAGNOSIS — Z3A19 19 weeks gestation of pregnancy: Secondary | ICD-10-CM | POA: Diagnosis not present

## 2017-09-07 DIAGNOSIS — Z3689 Encounter for other specified antenatal screening: Secondary | ICD-10-CM | POA: Insufficient documentation

## 2017-09-07 DIAGNOSIS — O99322 Drug use complicating pregnancy, second trimester: Secondary | ICD-10-CM | POA: Diagnosis not present

## 2017-09-11 ENCOUNTER — Other Ambulatory Visit (HOSPITAL_COMMUNITY)
Admission: RE | Admit: 2017-09-11 | Discharge: 2017-09-11 | Disposition: A | Discharge status: Home / Self Care | Payer: Medicaid Other | Source: Ambulatory Visit | Attending: Obstetrics and Gynecology | Admitting: Obstetrics and Gynecology

## 2017-09-11 ENCOUNTER — Encounter: Payer: Self-pay | Admitting: Obstetrics and Gynecology

## 2017-09-11 ENCOUNTER — Ambulatory Visit (INDEPENDENT_AMBULATORY_CARE_PROVIDER_SITE_OTHER): Payer: Medicaid Other | Admitting: Obstetrics and Gynecology

## 2017-09-11 VITALS — BP 110/76 | HR 101 | Wt 170.8 lb

## 2017-09-11 DIAGNOSIS — Z98891 History of uterine scar from previous surgery: Secondary | ICD-10-CM

## 2017-09-11 DIAGNOSIS — A599 Trichomoniasis, unspecified: Secondary | ICD-10-CM | POA: Insufficient documentation

## 2017-09-11 DIAGNOSIS — Z789 Other specified health status: Secondary | ICD-10-CM

## 2017-09-11 DIAGNOSIS — Z3480 Encounter for supervision of other normal pregnancy, unspecified trimester: Secondary | ICD-10-CM

## 2017-09-11 DIAGNOSIS — Z3009 Encounter for other general counseling and advice on contraception: Secondary | ICD-10-CM

## 2017-09-11 DIAGNOSIS — O358XX0 Maternal care for other (suspected) fetal abnormality and damage, not applicable or unspecified: Secondary | ICD-10-CM | POA: Insufficient documentation

## 2017-09-11 DIAGNOSIS — O35EXX0 Maternal care for other (suspected) fetal abnormality and damage, fetal genitourinary anomalies, not applicable or unspecified: Secondary | ICD-10-CM | POA: Insufficient documentation

## 2017-09-11 NOTE — Progress Notes (Signed)
   PRENATAL VISIT NOTE  Subjective:  Debra Sullivan is a 32 y.o. [phone] at 33w4dbeing seen today for ongoing prenatal care.  She is currently monitored for the following issues for this high-risk pregnancy and has Psychosis ([place]; MDD (major depressive disorder), single episode, severe with psychosis ([place]; Cannabis use disorder, severe, dependence ([place]; PTSD (post-traumatic stress disorder); Cocaine use disorder, moderate, in early remission ([place]; Supervision of other normal pregnancy, antepartum; History of cesarean section; Unwanted fertility; Trichomonosis; Not immune to rubella; and Fetal renal anomaly, single gestation on their problem list.  Patient reports no complaints.  Contractions: Not present. Vag. Bleeding: None.  Movement: Present. Denies leaking of fluid.   The following portions of the patient's history were reviewed and updated as appropriate: allergies, current medications, past family history, past medical history, past social history, past surgical history and problem list. Problem list updated.  Objective:   Vitals:   09/11/17 1420  BP: 110/76  Pulse: (!) 101  Weight: 170 lb 12.8 oz (77.5 kg)    Fetal Status: Fetal Heart Rate (bpm): 140   Movement: Present     General:  Alert, oriented and cooperative. Patient is in no acute distress.  Skin: Skin is warm and dry. No rash noted.   Cardiovascular: Normal heart rate noted  Respiratory: Normal respiratory effort, no problems with respiration noted  Abdomen: Soft, gravid, appropriate for gestational age.  Pain/Pressure: Absent     Pelvic: Cervical exam deferred        Extremities: Normal range of motion.  Edema: None  Mental Status:  Normal mood and affect. Normal behavior. Normal judgment and thought content.   Assessment and Plan:  Pregnancy: [id number] 178w4d1. Supervision of other normal pregnancy, antepartum  - [place] FOLLOW [place]; Future - Cervicovaginal ancillary only  2. History of cesarean  section  3. Unwanted fertility  4. Trichomonosis TOC done today  5. Not immune to rubella MMR post partum  6. Fetal Bilateral pyelectasis F/u [place] today  Preterm labor symptoms and general obstetric precautions including but not limited to vaginal bleeding, contractions, leaking of fluid and fetal movement were reviewed in detail with the patient. Please refer to After Visit Summary for other counseling recommendations.  Return in about 1 month (around 10/09/2017) for OB visit (MD).   KeSloan LeiterMD

## 2017-09-11 NOTE — Progress Notes (Signed)
TOC Trich due today. Pt states partner has been treated.

## 2017-09-12 ENCOUNTER — Encounter: Payer: Self-pay | Admitting: Obstetrics and Gynecology

## 2017-09-12 LAB — CERVICOVAGINAL ANCILLARY ONLY: TRICH (WINDOWPATH): NEGATIVE

## 2017-09-25 IMAGING — US USMFM FETAL BPP W/O NON-STRESS ADDL GEST
1 series · 15 of 16 positions shown · non-contrast
Comparison: none

[Series 1: usmfm fetal bpp w/o non-stress addl gest · 16 acquisitions, 15 frames shown]
[im 1/16]
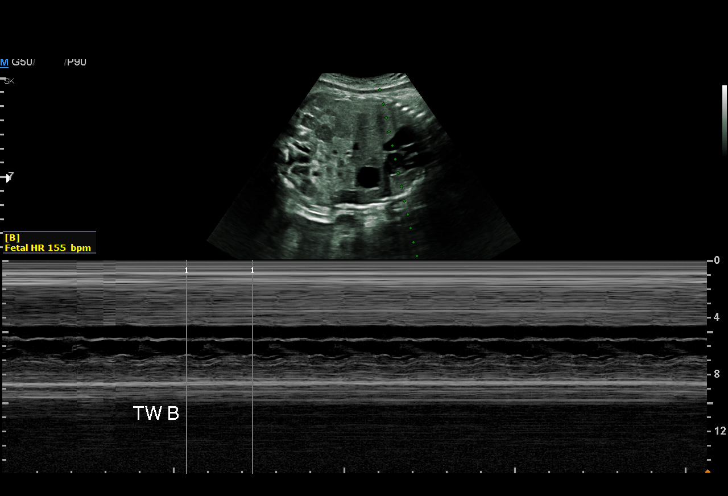
[im 2/16]
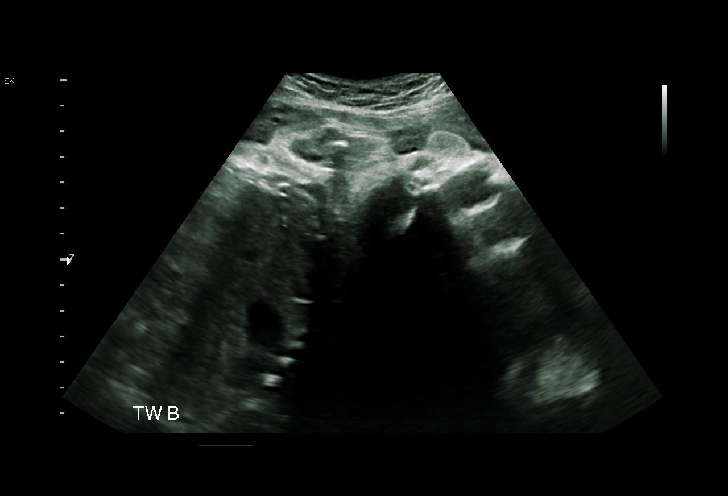
[im 3/16]
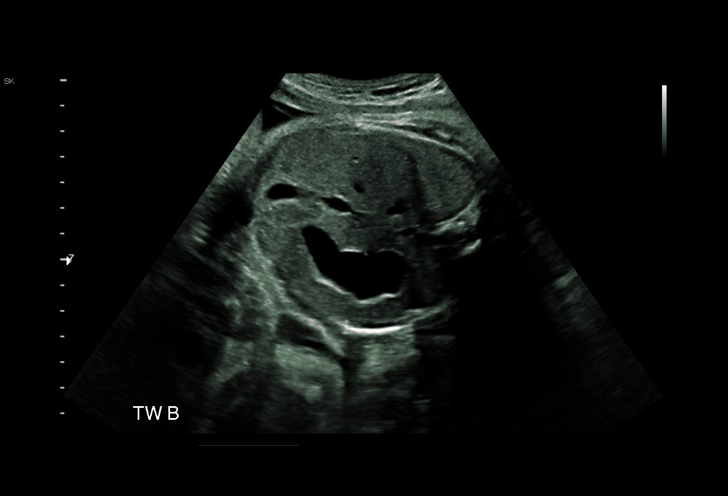
[im 4/16]
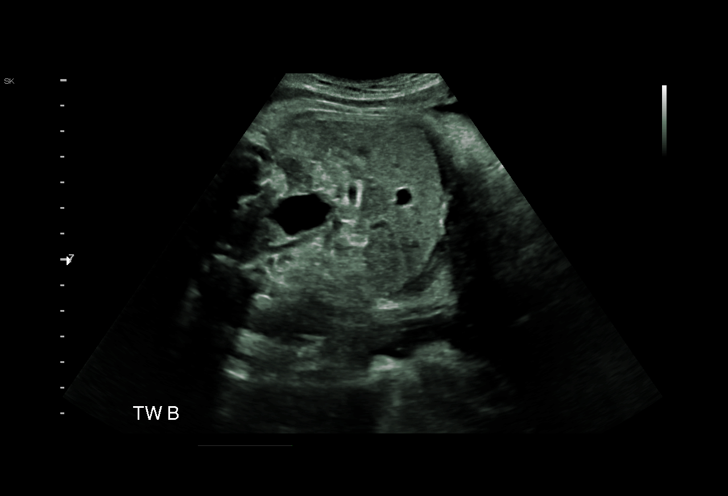
[im 5/16]
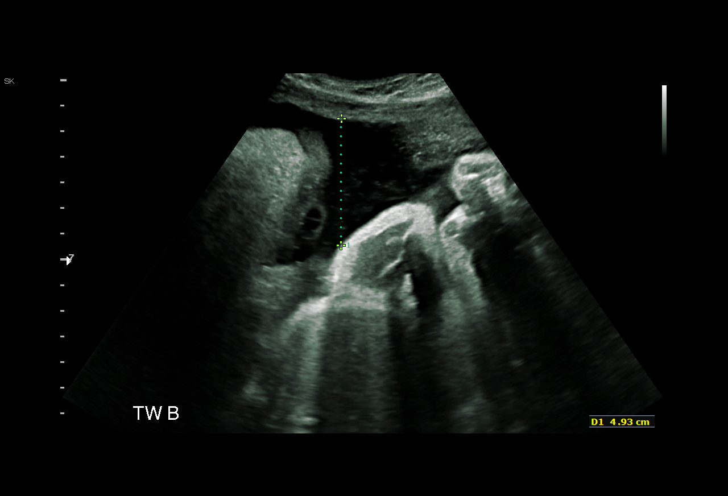
[im 6/16]
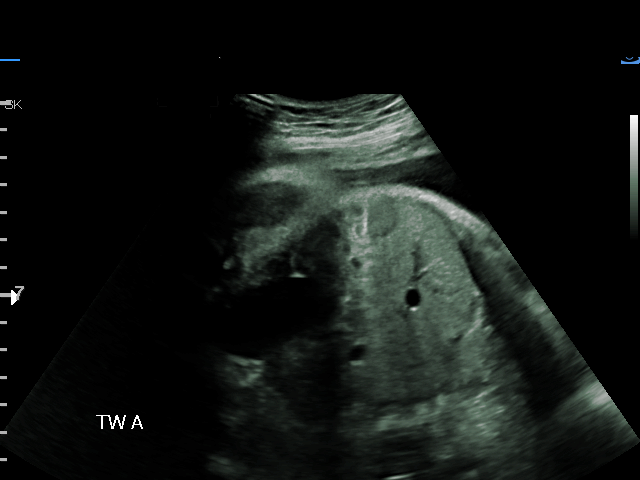
[im 7/16]
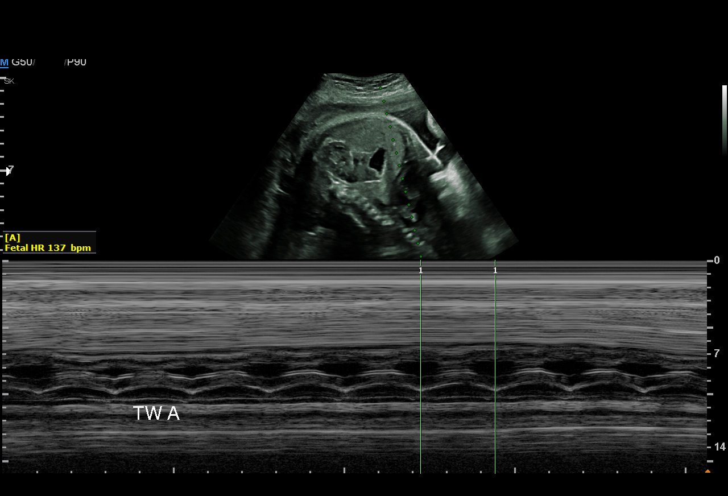
[im 9/16]
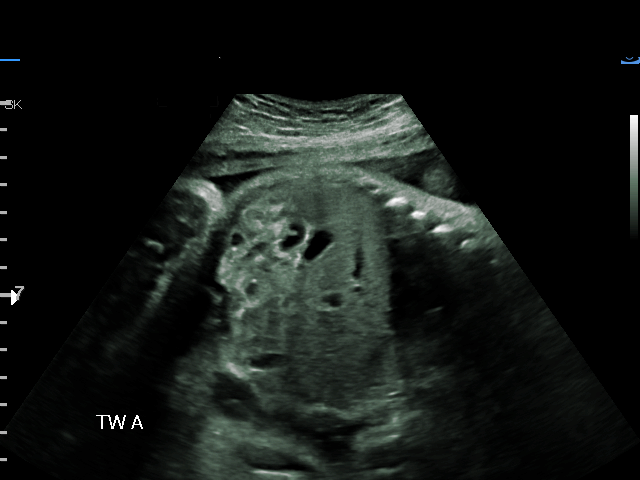
[im 10/16]
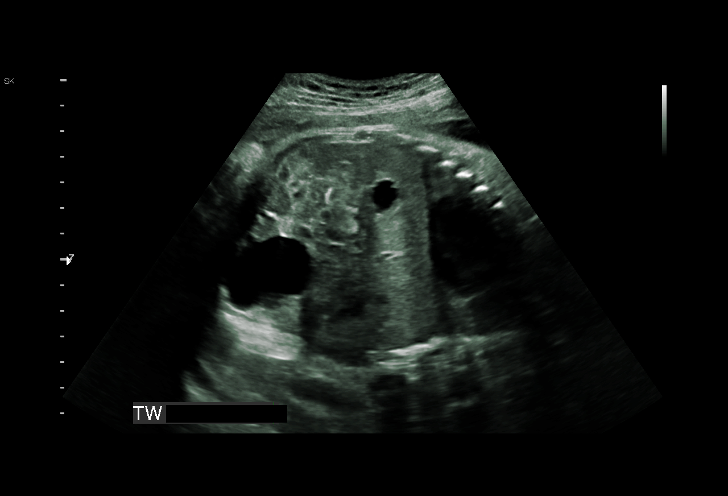
[im 11/16]
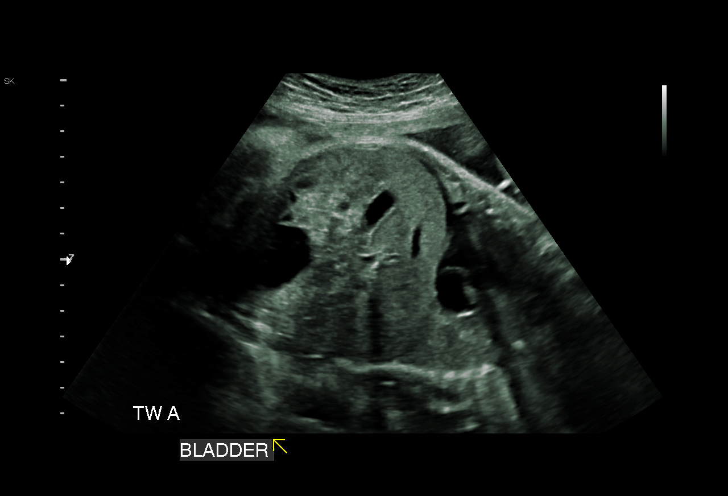
[im 12/16]
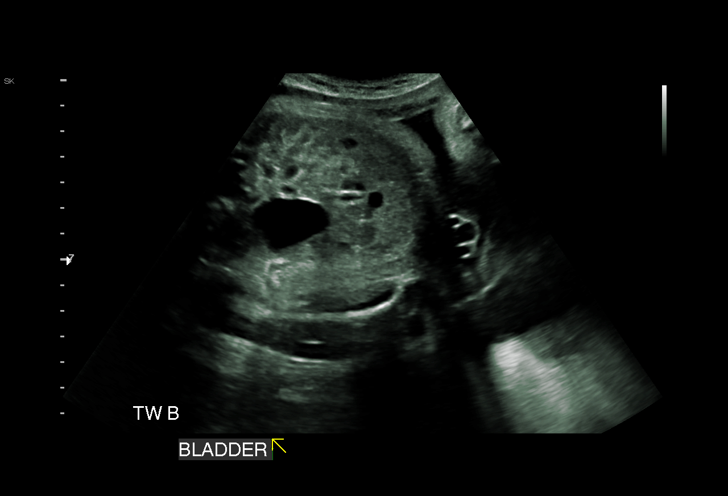
[im 13/16]
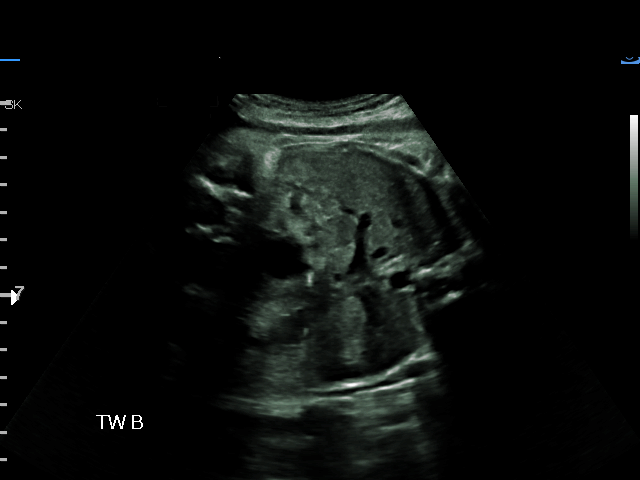
[im 14/16]
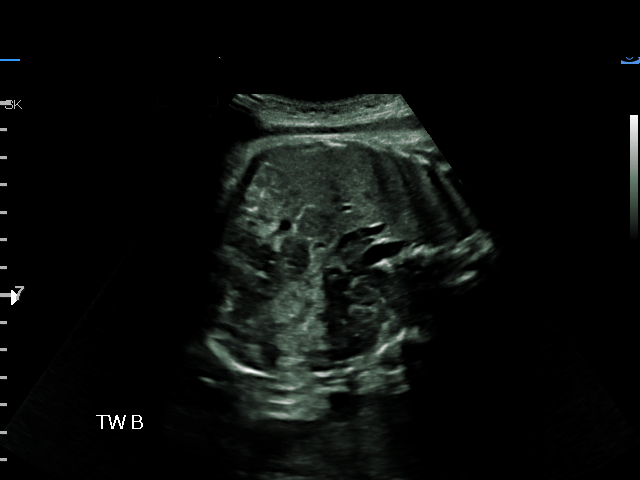
[im 15/16]
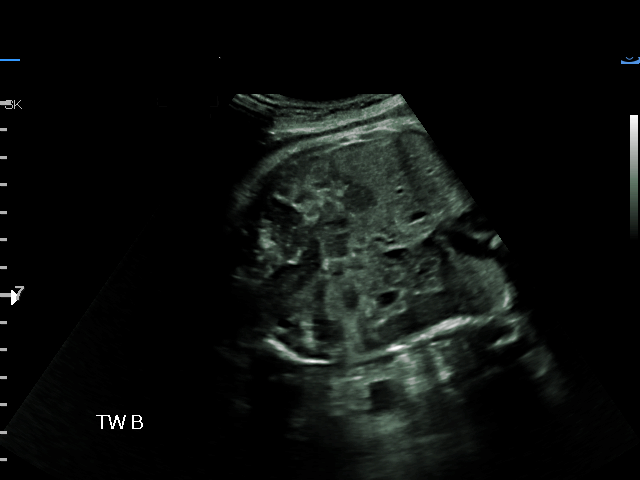
[im 16/16]
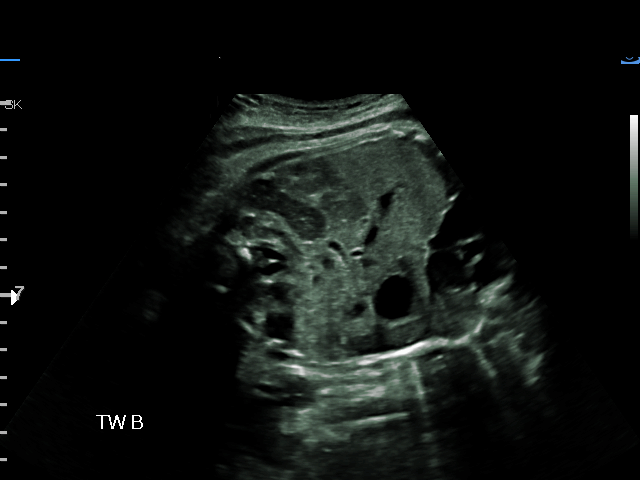

[15 of 16 positions shown; findings below may reference images not displayed]

Road [HOSPITAL]

GESTATION

1  CHENGTONG EIKO L            [id number]      [id number]     [id number]
2  CHENGTONG EIKO L            [id number]      [id number]     [id number]
Indications

36 weeks gestation of pregnancy
OB History

Blood Type:            Height:         Weight (lb):  162.4     BMI:
Gravidity:    5         Term:   3         SAB:   1
Living:       3
Fetal Evaluation (Fetus A)

Num Of Fetuses:     2
Fetal Heart         137
Rate(bpm):
Cardiac Activity:   Observed
Fetal Lie:          Right Fetus
Presentation:       Cephalic

Amniotic Fluid
AFI FV:      Subjectively within normal limits

Largest Pocket(cm)
6.1
Biophysical Evaluation (Fetus A)

Amniotic F.V:   Within normal limits       F. Tone:        Observed
F. Movement:    Observed                   Score:          [DATE]
F. Breathing:   Observed
Gestational Age (Fetus A)

Best:          36w 4d     Det. By:  Early Ultrasound         EDD:   04/16/16
(09/29/15)
Anatomy (Fetus A)

Stomach:               Appears normal, left   Bladder:                Appears normal
sided

Fetal Evaluation (Fetus B)

Num Of Fetuses:     2
Fetal Heart         155
Rate(bpm):
Cardiac Activity:   Observed
Fetal Lie:          Left Fetus
Presentation:       Cephalic

Amniotic Fluid
AFI FV:      Subjectively within normal limits

Largest Pocket(cm)
4.9
Biophysical Evaluation (Fetus B)

Amniotic F.V:   Within normal limits       F. Tone:        Observed
F. Movement:    Observed                   Score:          [DATE]
F. Breathing:   Observed
Gestational Age (Fetus B)

Best:          36w 4d     Det. By:  Early Ultrasound         EDD:   04/16/16
(09/29/15)
Cervix Uterus Adnexa

Cervix
Not visualized (advanced GA >14wks)
Impression

IUP at 36+4 weeks
Diamniotic dichorionic twin gestation with IUGR of both twins
Previous BPP [DATE] for twin B
Amniotic fluid volumes normal for each twin with MVP of
6.1cm for A and 4.9cm for B
BPP [DATE] both twins
Recommendations

Stable for transfer to [place] (NICU here at [facility]
diversion

## 2017-10-01 ENCOUNTER — Encounter: Payer: Self-pay | Admitting: *Deleted

## 2017-10-09 ENCOUNTER — Encounter: Payer: Self-pay | Admitting: Obstetrics and Gynecology

## 2017-10-09 ENCOUNTER — Ambulatory Visit (INDEPENDENT_AMBULATORY_CARE_PROVIDER_SITE_OTHER): Payer: Medicaid Other | Admitting: Obstetrics and Gynecology

## 2017-10-09 VITALS — BP 115/73 | HR 118 | Wt 184.2 lb

## 2017-10-09 DIAGNOSIS — O35EXX0 Maternal care for other (suspected) fetal abnormality and damage, fetal genitourinary anomalies, not applicable or unspecified: Secondary | ICD-10-CM

## 2017-10-09 DIAGNOSIS — Z3480 Encounter for supervision of other normal pregnancy, unspecified trimester: Secondary | ICD-10-CM

## 2017-10-09 DIAGNOSIS — Z3482 Encounter for supervision of other normal pregnancy, second trimester: Secondary | ICD-10-CM

## 2017-10-09 DIAGNOSIS — O358XX0 Maternal care for other (suspected) fetal abnormality and damage, not applicable or unspecified: Secondary | ICD-10-CM

## 2017-10-09 DIAGNOSIS — Z3009 Encounter for other general counseling and advice on contraception: Secondary | ICD-10-CM

## 2017-10-09 DIAGNOSIS — Z789 Other specified health status: Secondary | ICD-10-CM

## 2017-10-09 DIAGNOSIS — Z98891 History of uterine scar from previous surgery: Secondary | ICD-10-CM

## 2017-10-09 MED ORDER — VITAFOL GUMMIES 3.33-0.333-34.8 MG PO CHEW: 2.0000 | CHEWABLE_TABLET | Freq: Every day | ORAL | 6 refills | Status: AC

## 2017-10-09 NOTE — Patient Instructions (Signed)
Vaginal Birth After Cesarean Delivery Vaginal birth after cesarean delivery (VBAC) is giving birth vaginally after previously delivering a baby by a cesarean. In the past, if a woman had a cesarean delivery, all births afterward would be done by cesarean delivery. This is no longer true. It can be safe for the mother to try a vaginal delivery after having a cesarean delivery. It is important to discuss VBAC with your health care provider early in the pregnancy so you can understand the risks, benefits, and options. It will give you time to decide what is best in your particular case. The final decision about whether to have a VBAC or repeat cesarean delivery should be between you and your health care provider. Any changes in your health or your baby's health during your pregnancy may make it necessary to change your initial decision about VBAC. Women who plan to have a VBAC should check with their health care provider to be sure that:  The previous cesarean delivery was done with a low transverse uterine cut (incision) (not a vertical classical incision).  The birth canal is big enough for the baby.  There were no other operations on the uterus.  An electronic fetal monitor (EFM) will be on at all times during labor.  An operating room will be available and ready in case an emergency cesarean delivery is needed.  A health care provider and surgical nursing staff will be available at all times during labor to be ready to do an emergency delivery cesarean if necessary.  An anesthesiologist will be present in case an emergency cesarean delivery is needed.  The nursery is prepared and has adequate personnel and necessary equipment available to care for the baby in case of an emergency cesarean delivery. Benefits of VBAC  Shorter stay in the hospital.  Avoidance of risks associated with cesarean delivery, such as: ? Surgical complications, such as opening of the incision or hernia in the  incision. ? Injury to other organs. ? Fever. This can occur if an infection develops after surgery. It can also occur as a reaction to the medicine given to make you numb during the surgery.  Less blood loss and need for blood transfusions.  Lower risk of blood clots and infection.  Shorter recovery.  Decreased risk for having to remove the uterus (hysterectomy).  Decreased risk for the placenta to completely or partially cover the opening of the uterus (placenta previa) with a future pregnancy.  Decrease risk in future labor and delivery. Risks of a VBAC  Tearing (rupture) of the uterus. This is occurs in less than 1% of VBACs. The risk of this happening is higher if: ? Steps are taken to begin the labor process (induce labor) or stimulate or strengthen contractions (augment labor). ? Medicine is used to soften (ripen) the cervix.  Having to remove the uterus (hysterectomy) if it ruptures. VBAC should not be done if:  The previous cesarean delivery was done with a vertical (classical) or T-shaped incision or you do not know what kind of incision was made.  You had a ruptured uterus.  You have had certain types of surgery on your uterus, such as removal of uterine fibroids. Ask your health care provider about other types of surgeries that prevent you from having a VBAC.  You have certain medical or childbirth (obstetrical) problems.  There are problems with the baby.  You have had two previous cesarean deliveries and no vaginal deliveries. Other facts to know about VBAC:  It   is safe to have an epidural anesthetic with VBAC.  It is safe to turn the baby from a breech position (attempt an external cephalic version).  It is safe to try a VBAC with twins.  VBAC may not be successful if your baby weights 8.8 lb (4 kg) or more. However, weight predictions are not always accurate and should not be used alone to decide if VBAC is right for you.  There is an increased failure rate  if the time between the cesarean delivery and VBAC is less than 19 months.  Your health care provider may advise against a VBAC if you have preeclampsia (high blood pressure, protein in the urine, and swelling of face and extremities).  VBAC is often successful if you previously gave birth vaginally.  VBAC is often successful when the labor starts spontaneously before the due date.  Delivering a baby through a VBAC is similar to having a normal spontaneous vaginal delivery. This information is not intended to replace advice given to you by your health care provider. Make sure you discuss any questions you have with your health care provider. Document Released: 11/26/2006 Document Revised: 11/11/2015 Document Reviewed: 01/02/2013 Elsevier Interactive Patient Education  2018 Elsevier Inc.  

## 2017-10-09 NOTE — Progress Notes (Signed)
   PRENATAL VISIT NOTE  Subjective:  Debra Sullivan is a 32 y.o. [phone] at 1058w4d being seen today for ongoing prenatal care.  She is currently monitored for the following issues for this high-risk pregnancy and has Psychosis (HCC); MDD (major depressive disorder), single episode, severe with psychosis (HCC); Cannabis use disorder, severe, dependence (HCC); PTSD (post-traumatic stress disorder); Cocaine use disorder, moderate, in early remission (HCC); Supervision of other normal pregnancy, antepartum; History of cesarean section; Unwanted fertility; Trichomonosis; Not immune to rubella; and Fetal renal anomaly, single gestation on their problem list.  Patient reports no complaints.  Contractions: Not present. Vag. Bleeding: None.  Movement: Present. Denies leaking of fluid.   The following portions of the patient's history were reviewed and updated as appropriate: allergies, current medications, past family history, past medical history, past social history, past surgical history and problem list. Problem list updated.  Objective:   Vitals:   10/09/17 1429  BP: 115/73  Pulse: (!) 118  Weight: 184 lb 3.2 oz (83.6 kg)    Fetal Status: Fetal Heart Rate (bpm): 152 Fundal Height: 24 cm Movement: Present     General:  Alert, oriented and cooperative. Patient is in no acute distress.  Skin: Skin is warm and dry. No rash noted.   Cardiovascular: Normal heart rate noted  Respiratory: Normal respiratory effort, no problems with respiration noted  Abdomen: Soft, gravid, appropriate for gestational age.  Pain/Pressure: Absent     Pelvic: Cervical exam deferred        Extremities: Normal range of motion.  Edema: None  Mental Status: Normal mood and affect. Normal behavior. Normal judgment and thought content.   Assessment and Plan:  Pregnancy: [id number] at 6258w4d  1. Supervision of other normal pregnancy, antepartum Patient is doing well without complaints Third trimester labs next visit  2.  Fetal renal anomaly, single gestation Follow up ultrasound scheduled in May  3. History of cesarean section Patient desires repeat with BTL Will need to be scheduled at 39 weeks  4. Unwanted fertility Consent form to be signed next visit  5. Not immune to rubella Will offer pp  Preterm labor symptoms and general obstetric precautions including but not limited to vaginal bleeding, contractions, leaking of fluid and fetal movement were reviewed in detail with the patient. Please refer to After Visit Summary for other counseling recommendations.  Return in about 1 month (around 11/06/2017) for ROB, 2 hr glucola next visit.  Future Appointments  Date Time Provider Department Center  11/09/2017  1:45 PM [facility] US 2 [facility]    Catalina AntiguaPeggy Khyrin Trevathan, MD

## 2017-11-06 ENCOUNTER — Other Ambulatory Visit: Payer: Medicaid Other

## 2017-11-06 ENCOUNTER — Encounter: Payer: Self-pay | Admitting: Obstetrics and Gynecology

## 2017-11-07 ENCOUNTER — Telehealth: Payer: Self-pay | Admitting: *Deleted

## 2017-11-07 NOTE — Telephone Encounter (Signed)
Spoke with patient and she stated she had an emergency out of town and wont be back for 2 weeks, and I rescheduled missed appt for 11/06/2017.Marland KitchenMarland Kitchen

## 2017-11-09 ENCOUNTER — Ambulatory Visit (HOSPITAL_COMMUNITY)
Admission: RE | Admit: 2017-11-09 | Discharge: 2017-11-09 | Disposition: A | Discharge status: Home / Self Care | Payer: Medicaid Other | Source: Ambulatory Visit | Attending: Obstetrics and Gynecology | Admitting: Obstetrics and Gynecology

## 2017-11-09 ENCOUNTER — Other Ambulatory Visit: Payer: Self-pay | Admitting: Obstetrics and Gynecology

## 2017-11-09 DIAGNOSIS — Z362 Encounter for other antenatal screening follow-up: Secondary | ICD-10-CM

## 2017-11-09 DIAGNOSIS — Z3A28 28 weeks gestation of pregnancy: Secondary | ICD-10-CM

## 2017-11-09 DIAGNOSIS — O359XX0 Maternal care for (suspected) fetal abnormality and damage, unspecified, not applicable or unspecified: Secondary | ICD-10-CM

## 2017-11-09 DIAGNOSIS — O99323 Drug use complicating pregnancy, third trimester: Secondary | ICD-10-CM | POA: Diagnosis present

## 2017-11-09 DIAGNOSIS — F1920 Other psychoactive substance dependence, uncomplicated: Secondary | ICD-10-CM | POA: Insufficient documentation

## 2017-11-09 DIAGNOSIS — Z3480 Encounter for supervision of other normal pregnancy, unspecified trimester: Secondary | ICD-10-CM

## 2017-11-09 DIAGNOSIS — O358XX0 Maternal care for other (suspected) fetal abnormality and damage, not applicable or unspecified: Secondary | ICD-10-CM | POA: Diagnosis not present

## 2017-11-09 DIAGNOSIS — O34219 Maternal care for unspecified type scar from previous cesarean delivery: Secondary | ICD-10-CM

## 2017-11-21 ENCOUNTER — Other Ambulatory Visit: Payer: Medicaid Other

## 2017-11-21 ENCOUNTER — Encounter: Payer: Self-pay | Admitting: Certified Nurse Midwife

## 2017-11-27 ENCOUNTER — Ambulatory Visit (INDEPENDENT_AMBULATORY_CARE_PROVIDER_SITE_OTHER): Payer: Medicaid Other | Admitting: Certified Nurse Midwife

## 2017-11-27 ENCOUNTER — Encounter: Payer: Self-pay | Admitting: *Deleted

## 2017-11-27 VITALS — BP 115/77 | HR 105 | Wt 193.4 lb

## 2017-11-27 DIAGNOSIS — O99323 Drug use complicating pregnancy, third trimester: Secondary | ICD-10-CM

## 2017-11-27 DIAGNOSIS — Z789 Other specified health status: Secondary | ICD-10-CM

## 2017-11-27 DIAGNOSIS — Z3483 Encounter for supervision of other normal pregnancy, third trimester: Secondary | ICD-10-CM

## 2017-11-27 DIAGNOSIS — Z3480 Encounter for supervision of other normal pregnancy, unspecified trimester: Secondary | ICD-10-CM

## 2017-11-27 DIAGNOSIS — Z3009 Encounter for other general counseling and advice on contraception: Secondary | ICD-10-CM

## 2017-11-27 LAB — POCT CBG (FASTING - GLUCOSE)-MANUAL ENTRY: Glucose Fasting, POC: 77 mg/dL (ref 70–99)

## 2017-11-27 NOTE — Progress Notes (Signed)
Patient reports good fetal movement with pressure, denies contractions.Pt states that transportation issues caused her to miss appts.

## 2017-11-27 NOTE — Progress Notes (Signed)
   PRENATAL VISIT NOTE  Subjective:  Debra Sullivan is a 32 y.o. [phone] at 56w4dbeing seen today for ongoing prenatal care.  She is currently monitored for the following issues for this low-risk pregnancy and has Psychosis ([place]; MDD (major depressive disorder), single episode, severe with psychosis ([place]; Cannabis use disorder, severe, dependence ([place]; PTSD (post-traumatic stress disorder); Cocaine use disorder, moderate, in early remission ([place]; Supervision of other normal pregnancy, antepartum; History of cesarean section complicating pregnancy; Unwanted fertility; Trichomonosis; Not immune to rubella; [redacted] weeks gestation of pregnancy; Drug dependence affecting pregnancy in third trimester; Suspected fetal anomaly, antepartum; and Encounter for other antenatal screening follow-up on their problem list.  Patient reports no complaints.  Contractions: Not present. Vag. Bleeding: None.  Movement: Present. Denies leaking of fluid.   The following portions of the patient's history were reviewed and updated as appropriate: allergies, current medications, past family history, past medical history, past social history, past surgical history and problem list. Problem list updated.  Objective:   Vitals:   11/27/17 1359  BP: 115/77  Pulse: (!) 105  Weight: 193 lb 6.4 oz (87.7 kg)    Fetal Status: Fetal Heart Rate (bpm): 152; doppler Fundal Height: 30 cm Movement: Present     General:  Alert, oriented and cooperative. Patient is in no acute distress.  Skin: Skin is warm and dry. No rash noted.   Cardiovascular: Normal heart rate noted  Respiratory: Normal respiratory effort, no problems with respiration noted  Abdomen: Soft, gravid, appropriate for gestational age.  Pain/Pressure: Present     Pelvic: Cervical exam deferred        Extremities: Normal range of motion.  Edema: None  Mental Status: Normal mood and affect. Normal behavior. Normal judgment and thought content.   Assessment and  Plan:  Pregnancy: [id number] 344w4d1. Supervision of other normal pregnancy, antepartum     Desires repeat C-section at 39 wks with BTL.  Message sent to scheduling.  Has not been to the office since April d/t childcare issues.  Patient agreed to 1 hour OGTT, no hx of GDM in previous pregnancies.   - [place]; Future - CBC - HIV antibody - RPR - Hemoglobin A1c - POCT CBG (Fasting - Glucose) - Glucose tolerance, 1 hour  2. Drug dependence affecting pregnancy in third trimester     Last +UDS in 2016.   - [place] FOLLOW UP; Future  3. Unwanted fertility     BTL paperwork completed today  4. Not immune to rubella     MMR postpartum  Preterm labor symptoms and general obstetric precautions including but not limited to vaginal bleeding, contractions, leaking of fluid and fetal movement were reviewed in detail with the patient. Please refer to After Visit Summary for other counseling recommendations.  Return in about 2 weeks (around 12/11/2017) for ROB.  No future appointments.  RaMorene CrockerCNM

## 2017-11-27 NOTE — Addendum Note (Signed)
Addended by: Natale MilchSTALLING, BRITTANY D on: 11/27/2017 04:21 PM   Modules accepted: Orders

## 2017-11-28 ENCOUNTER — Other Ambulatory Visit: Payer: Self-pay | Admitting: Certified Nurse Midwife

## 2017-11-28 DIAGNOSIS — Z3480 Encounter for supervision of other normal pregnancy, unspecified trimester: Secondary | ICD-10-CM

## 2017-11-28 DIAGNOSIS — O99013 Anemia complicating pregnancy, third trimester: Secondary | ICD-10-CM

## 2017-11-28 LAB — HEMOGLOBIN A1C
Est. average glucose Bld gHb Est-mCnc: 97 mg/dL
Hgb A1c MFr Bld: 5 % (ref 4.8–5.6)

## 2017-11-28 LAB — CBC
HEMOGLOBIN: 10.6 g/dL — AB (ref 11.1–15.9)
Hematocrit: 32.5 % — ABNORMAL LOW (ref 34.0–46.6)
MCH: 24.9 pg — AB (ref 26.6–33.0)
MCHC: 32.6 g/dL (ref 31.5–35.7)
MCV: 77 fL — ABNORMAL LOW (ref 79–97)
Platelets: 366 10*3/uL (ref 150–450)
RBC: 4.25 x10E6/uL (ref 3.77–5.28)
RDW: 15 % (ref 12.3–15.4)
WBC: 10.8 10*3/uL (ref 3.4–10.8)

## 2017-11-28 LAB — RPR: RPR Ser Ql: NONREACTIVE

## 2017-11-28 LAB — HIV ANTIBODY (ROUTINE TESTING W REFLEX): HIV SCREEN 4TH GENERATION: NONREACTIVE

## 2017-11-28 LAB — GLUCOSE TOLERANCE, 1 HOUR: GLUCOSE, 1HR PP: 108 mg/dL (ref 65–199)

## 2017-11-28 MED ORDER — CITRANATAL BLOOM 90-1 MG PO TABS
1.0000 | ORAL_TABLET | Freq: Every day | ORAL | 12 refills | Status: DC
Start: 2017-11-28 — End: 2018-01-15

## 2017-12-01 LAB — TOXASSURE SELECT 13 (MW), URINE

## 2017-12-04 ENCOUNTER — Encounter (HOSPITAL_COMMUNITY): Payer: Self-pay

## 2017-12-06 ENCOUNTER — Encounter (HOSPITAL_COMMUNITY): Payer: Self-pay

## 2017-12-06 ENCOUNTER — Other Ambulatory Visit: Payer: Self-pay | Admitting: Certified Nurse Midwife

## 2017-12-06 ENCOUNTER — Ambulatory Visit (HOSPITAL_COMMUNITY)
Admission: RE | Admit: 2017-12-06 | Discharge: 2017-12-06 | Disposition: A | Discharge status: Home / Self Care | Payer: Medicaid Other | Source: Ambulatory Visit | Attending: Certified Nurse Midwife | Admitting: Certified Nurse Midwife

## 2017-12-06 DIAGNOSIS — O99323 Drug use complicating pregnancy, third trimester: Secondary | ICD-10-CM | POA: Insufficient documentation

## 2017-12-06 DIAGNOSIS — Z3A31 31 weeks gestation of pregnancy: Secondary | ICD-10-CM

## 2017-12-06 DIAGNOSIS — Z3480 Encounter for supervision of other normal pregnancy, unspecified trimester: Secondary | ICD-10-CM

## 2017-12-06 DIAGNOSIS — F1920 Other psychoactive substance dependence, uncomplicated: Secondary | ICD-10-CM

## 2017-12-11 ENCOUNTER — Other Ambulatory Visit: Payer: Self-pay | Admitting: Certified Nurse Midwife

## 2017-12-11 ENCOUNTER — Encounter: Payer: Self-pay | Admitting: Certified Nurse Midwife

## 2017-12-11 DIAGNOSIS — Z3480 Encounter for supervision of other normal pregnancy, unspecified trimester: Secondary | ICD-10-CM

## 2017-12-18 ENCOUNTER — Encounter: Payer: Self-pay | Admitting: Certified Nurse Midwife

## 2017-12-18 ENCOUNTER — Ambulatory Visit (INDEPENDENT_AMBULATORY_CARE_PROVIDER_SITE_OTHER): Payer: Medicaid Other | Admitting: Certified Nurse Midwife

## 2017-12-18 VITALS — BP 110/71 | HR 102 | Wt 200.0 lb

## 2017-12-18 DIAGNOSIS — O99013 Anemia complicating pregnancy, third trimester: Secondary | ICD-10-CM

## 2017-12-18 DIAGNOSIS — Z789 Other specified health status: Secondary | ICD-10-CM

## 2017-12-18 DIAGNOSIS — Z3480 Encounter for supervision of other normal pregnancy, unspecified trimester: Secondary | ICD-10-CM

## 2017-12-18 NOTE — Progress Notes (Signed)
   PRENATAL VISIT NOTE  Subjective:  Debra Sullivan is a 32 y.o. [phone] at 24w4dbeing seen today for ongoing prenatal care.  She is currently monitored for the following issues for this low-risk pregnancy and has Psychosis ([place]; MDD (major depressive disorder), single episode, severe with psychosis (HF[place]; Cannabis use disorder, severe, dependence ([place]; PTSD (post-traumatic stress disorder); Cocaine use disorder, moderate, in early remission ([place]; Supervision of other normal pregnancy, antepartum; History of cesarean section complicating pregnancy; Unwanted fertility; Trichomonosis; Not immune to rubella; [redacted] weeks gestation of pregnancy; Drug dependence affecting pregnancy in third trimester; Suspected fetal anomaly, antepartum; and Anemia during pregnancy in third trimester on their problem list.  Patient reports no complaints.  Contractions: Not present. Vag. Bleeding: None.  Movement: Present. Denies leaking of fluid.   The following portions of the patient's history were reviewed and updated as appropriate: allergies, current medications, past family history, past medical history, past social history, past surgical history and problem list. Problem list updated.  Objective:   Vitals:   12/18/17 1404  BP: 110/71  Pulse: (!) 102  Weight: 200 lb (90.7 kg)    Fetal Status: Fetal Heart Rate (bpm): 146; doppler Fundal Height: 31 cm Movement: Present     General:  Alert, oriented and cooperative. Patient is in no acute distress.  Skin: Skin is warm and dry. No rash noted.   Cardiovascular: Normal heart rate noted  Respiratory: Normal respiratory effort, no problems with respiration noted  Abdomen: Soft, gravid, appropriate for gestational age.  Pain/Pressure: Absent     Pelvic: Cervical exam deferred        Extremities: Normal range of motion.  Edema: None  Mental Status: Normal mood and affect. Normal behavior. Normal judgment and thought content.   Assessment and Plan:    Pregnancy: [id number] 351w4d1. Supervision of other normal pregnancy, antepartum     Doing well.  Repeat C/S scheduled for 01/25/18.    2. Not immune to rubella     MMR postpartum   3. Anemia during pregnancy in third trimester     Taking iron.    Preterm labor symptoms and general obstetric precautions including but not limited to vaginal bleeding, contractions, leaking of fluid and fetal movement were reviewed in detail with the patient. Please refer to After Visit Summary for other counseling recommendations.  Return in about 2 weeks (around 01/01/2018) for ROB, GBS.  No future appointments.  RaMorene CrockerCNM

## 2017-12-18 NOTE — Progress Notes (Signed)
ROB w/ no complaints. 110/71

## 2018-01-01 ENCOUNTER — Ambulatory Visit (INDEPENDENT_AMBULATORY_CARE_PROVIDER_SITE_OTHER): Payer: Medicaid Other | Admitting: Medical

## 2018-01-01 ENCOUNTER — Other Ambulatory Visit (HOSPITAL_COMMUNITY)
Admission: RE | Admit: 2018-01-01 | Discharge: 2018-01-01 | Disposition: A | Discharge status: Home / Self Care | Payer: Medicaid Other | Source: Ambulatory Visit | Attending: Medical | Admitting: Medical

## 2018-01-01 VITALS — BP 118/78 | HR 103 | Wt 202.2 lb

## 2018-01-01 DIAGNOSIS — A599 Trichomoniasis, unspecified: Secondary | ICD-10-CM

## 2018-01-01 DIAGNOSIS — O99013 Anemia complicating pregnancy, third trimester: Secondary | ICD-10-CM

## 2018-01-01 DIAGNOSIS — Z3483 Encounter for supervision of other normal pregnancy, third trimester: Secondary | ICD-10-CM | POA: Diagnosis not present

## 2018-01-01 DIAGNOSIS — Z23 Encounter for immunization: Secondary | ICD-10-CM | POA: Diagnosis not present

## 2018-01-01 DIAGNOSIS — Z98891 History of uterine scar from previous surgery: Secondary | ICD-10-CM

## 2018-01-01 DIAGNOSIS — O34219 Maternal care for unspecified type scar from previous cesarean delivery: Secondary | ICD-10-CM

## 2018-01-01 DIAGNOSIS — Z3009 Encounter for other general counseling and advice on contraception: Secondary | ICD-10-CM

## 2018-01-01 DIAGNOSIS — Z3480 Encounter for supervision of other normal pregnancy, unspecified trimester: Secondary | ICD-10-CM

## 2018-01-01 LAB — OB RESULTS CONSOLE GC/CHLAMYDIA: Gonorrhea: NEGATIVE

## 2018-01-01 NOTE — Patient Instructions (Signed)
Research childbirth classes and hospital preregistration at [email or web address]  Fetal Movement Counts Patient Name: ________________________________________________ Patient Due Date: ____________________ What is a fetal movement count? A fetal movement count is the number of times that you feel your baby move during a certain amount of time. This may also be called a fetal kick count. A fetal movement count is recommended for every pregnant woman. You may be asked to start counting fetal movements as early as week 28 of your pregnancy. Pay attention to when your baby is most active. You may notice your baby's sleep and wake cycles. You may also notice things that make your baby move more. You should do a fetal movement count:  When your baby is normally most active.  At the same time each day.  A good time to count movements is while you are resting, after having something to eat and drink. How do I count fetal movements? 1. Find a quiet, comfortable area. Sit, or lie down on your side. 2. Write down the date, the start time and stop time, and the number of movements that you felt between those two times. Take this information with you to your health care visits. 3. For 2 hours, count kicks, flutters, swishes, rolls, and jabs. You should feel at least 10 movements during 2 hours. 4. You may stop counting after you have felt 10 movements. 5. If you do not feel 10 movements in 2 hours, have something to eat and drink. Then, keep resting and counting for 1 hour. If you feel at least 4 movements during that hour, you may stop counting. Contact a health care provider if:  You feel fewer than 4 movements in 2 hours.  Your baby is not moving like he or she usually does. Date: ____________ Start time: ____________ Stop time: ____________ Movements: ____________ Date: ____________ Start time: ____________ Stop time: ____________ Movements: ____________ Date: ____________ Start time: ____________  Stop time: ____________ Movements: ____________ Date: ____________ Start time: ____________ Stop time: ____________ Movements: ____________ Date: ____________ Start time: ____________ Stop time: ____________ Movements: ____________ Date: ____________ Start time: ____________ Stop time: ____________ Movements: ____________ Date: ____________ Start time: ____________ Stop time: ____________ Movements: ____________ Date: ____________ Start time: ____________ Stop time: ____________ Movements: ____________ Date: ____________ Start time: ____________ Stop time: ____________ Movements: ____________ This information is not intended to replace advice given to you by your health care provider. Make sure you discuss any questions you have with your health care provider. Document Released: 07/05/2006 Document Revised: 02/02/2016 Document Reviewed: 07/15/2015 Elsevier Interactive Patient Education  2018 [employer]  Braxton Hicks Contractions Contractions of the uterus can occur throughout pregnancy, but they are not always a sign that you are in labor. You may have practice contractions called Braxton Hicks contractions. These false labor contractions are sometimes confused with true labor. What are Braxton Hicks contractions? Braxton Hicks contractions are tightening movements that occur in the muscles of the uterus before labor. Unlike true labor contractions, these contractions do not result in opening (dilation) and thinning of the cervix. Toward the end of pregnancy (32-34 weeks), Braxton Hicks contractions can happen more often and may become stronger. These contractions are sometimes difficult to tell apart from true labor because they can be very uncomfortable. You should not feel embarrassed if you go to the hospital with false labor. Sometimes, the only way to tell if you are in true labor is for your health care provider to look for changes in the cervix. The health care provider will   do a physical  exam and may monitor your contractions. If you are not in true labor, the exam should show that your cervix is not dilating and your water has not broken. If there are other health problems associated with your pregnancy, it is completely safe for you to be sent home with false labor. You may continue to have Braxton Hicks contractions until you go into true labor. How to tell the difference between true labor and false labor True labor  Contractions last 30-70 seconds.  Contractions become very regular.  Discomfort is usually felt in the top of the uterus, and it spreads to the lower abdomen and low back.  Contractions do not go away with walking.  Contractions usually become more intense and increase in frequency.  The cervix dilates and gets thinner. False labor  Contractions are usually shorter and not as strong as true labor contractions.  Contractions are usually irregular.  Contractions are often felt in the front of the lower abdomen and in the groin.  Contractions may go away when you walk around or change positions while lying down.  Contractions get weaker and are shorter-lasting as time goes on.  The cervix usually does not dilate or become thin. Follow these instructions at home:  Take over-the-counter and prescription medicines only as told by your health care provider.  Keep up with your usual exercises and follow other instructions from your health care provider.  Eat and drink lightly if you think you are going into labor.  If Braxton Hicks contractions are making you uncomfortable: ? Change your position from lying down or resting to walking, or change from walking to resting. ? Sit and rest in a tub of warm water. ? Drink enough fluid to keep your urine pale yellow. Dehydration may cause these contractions. ? Do slow and deep breathing several times an hour.  Keep all follow-up prenatal visits as told by your health care provider. This is  important. Contact a health care provider if:  You have a fever.  You have continuous pain in your abdomen. Get help right away if:  Your contractions become stronger, more regular, and closer together.  You have fluid leaking or gushing from your vagina.  You pass blood-tinged mucus (bloody show).  You have bleeding from your vagina.  You have low back pain that you never had before.  You feel your baby's head pushing down and causing pelvic pressure.  Your baby is not moving inside you as much as it used to. Summary  Contractions that occur before labor are called Braxton Hicks contractions, false labor, or practice contractions.  Braxton Hicks contractions are usually shorter, weaker, farther apart, and less regular than true labor contractions. True labor contractions usually become progressively stronger and regular and they become more frequent.  Manage discomfort from Braxton Hicks contractions by changing position, resting in a warm bath, drinking plenty of water, or practicing deep breathing. This information is not intended to replace advice given to you by your health care provider. Make sure you discuss any questions you have with your health care provider. Document Released: 10/19/2016 Document Revised: 10/19/2016 Document Reviewed: 10/19/2016 Elsevier Interactive Patient Education  2018 Elsevier Inc.    

## 2018-01-01 NOTE — Progress Notes (Signed)
Patient reports good fetal movement, denies pain. 

## 2018-01-01 NOTE — Progress Notes (Signed)
   PRENATAL VISIT NOTE  Subjective:  Debra Sullivan is a 32 y.o. [phone] at 6025w4d being seen today for ongoing prenatal care.  She is currently monitored for the following issues for this high-risk pregnancy and has Psychosis (HCC); MDD (major depressive disorder), single episode, severe with psychosis (HCC); Cannabis use disorder, severe, dependence (HCC); PTSD (post-traumatic stress disorder); Cocaine use disorder, moderate, in early remission (HCC); Supervision of other normal pregnancy, antepartum; History of cesarean section complicating pregnancy; Unwanted fertility; Trichomonosis; Not immune to rubella; [redacted] weeks gestation of pregnancy; Drug dependence affecting pregnancy in third trimester; Suspected fetal anomaly, antepartum; and Anemia during pregnancy in third trimester on their problem list.  Patient reports no complaints.  Contractions: Not present. Vag. Bleeding: None.  Movement: Present. Denies leaking of fluid.   The following portions of the patient's history were reviewed and updated as appropriate: allergies, current medications, past family history, past medical history, past social history, past surgical history and problem list. Problem list updated.  Objective:   Vitals:   01/01/18 1031  BP: 118/78  Pulse: (!) 103  Weight: 202 lb 3.2 oz (91.7 kg)    Fetal Status: Fetal Heart Rate (bpm): 144 Fundal Height: 34 cm Movement: Present     General:  Alert, oriented and cooperative. Patient is in no acute distress.  Skin: Skin is warm and dry. No rash noted.   Cardiovascular: Normal heart rate noted  Respiratory: Normal respiratory effort, no problems with respiration noted  Abdomen: Soft, gravid, appropriate for gestational age.  Pain/Pressure: Absent     Pelvic: Cervical exam performed Dilation: Closed Effacement (%): Thick Station: -3  Extremities: Normal range of motion.  Edema: None  Mental Status: Normal mood and affect. Normal behavior. Normal judgment and thought  content.   Assessment and Plan:  Pregnancy: [id number] at 2625w4d  1. Supervision of other normal pregnancy, antepartum - Strep Gp B NAA - Cervicovaginal ancillary only - Tdap vaccine greater than or equal to 7yo IM  2. Unwanted fertility - Planning BTL with C/S  3. History of cesarean section - Plan for repeat, scheduled 01/25/18  4.Anemia during pregnancy in third trimester - Taking iron, encouraged to continue until after delivery   6. Trichomonosis - TOC negative in March  Preterm labor symptoms and general obstetric precautions including but not limited to vaginal bleeding, contractions, leaking of fluid and fetal movement were reviewed in detail with the patient. Please refer to After Visit Summary for other counseling recommendations.  [facility] in about 1 [facility] (around 01[facility]08/2018[facility]  Vonzella NippleJulie Wenzel, PA-C

## 2018-01-02 LAB — CERVICOVAGINAL ANCILLARY ONLY
Chlamydia: NEGATIVE
Neisseria Gonorrhea: NEGATIVE

## 2018-01-03 LAB — STREP GP B NAA: STREP GROUP B AG: NEGATIVE

## 2018-01-08 ENCOUNTER — Ambulatory Visit (INDEPENDENT_AMBULATORY_CARE_PROVIDER_SITE_OTHER): Payer: Medicaid Other | Admitting: Medical

## 2018-01-08 ENCOUNTER — Encounter: Payer: Self-pay | Admitting: Medical

## 2018-01-08 VITALS — BP 129/78 | HR 96 | Wt 205.2 lb

## 2018-01-08 DIAGNOSIS — O3663X1 Maternal care for excessive fetal growth, third trimester, fetus 1: Secondary | ICD-10-CM

## 2018-01-08 DIAGNOSIS — O99323 Drug use complicating pregnancy, third trimester: Secondary | ICD-10-CM

## 2018-01-08 DIAGNOSIS — Z3483 Encounter for supervision of other normal pregnancy, third trimester: Secondary | ICD-10-CM

## 2018-01-08 DIAGNOSIS — Z3009 Encounter for other general counseling and advice on contraception: Secondary | ICD-10-CM

## 2018-01-08 DIAGNOSIS — Z789 Other specified health status: Secondary | ICD-10-CM

## 2018-01-08 DIAGNOSIS — O3663X0 Maternal care for excessive fetal growth, third trimester, not applicable or unspecified: Secondary | ICD-10-CM

## 2018-01-08 DIAGNOSIS — Z3480 Encounter for supervision of other normal pregnancy, unspecified trimester: Secondary | ICD-10-CM

## 2018-01-08 NOTE — Patient Instructions (Signed)
Research childbirth classes and hospital preregistration at [email or web address]  Fetal Movement Counts Patient Name: ________________________________________________ Patient Due Date: ____________________ What is a fetal movement count? A fetal movement count is the number of times that you feel your baby move during a certain amount of time. This may also be called a fetal kick count. A fetal movement count is recommended for every pregnant woman. You may be asked to start counting fetal movements as early as week 28 of your pregnancy. Pay attention to when your baby is most active. You may notice your baby's sleep and wake cycles. You may also notice things that make your baby move more. You should do a fetal movement count:  When your baby is normally most active.  At the same time each day.  A good time to count movements is while you are resting, after having something to eat and drink. How do I count fetal movements? 1. Find a quiet, comfortable area. Sit, or lie down on your side. 2. Write down the date, the start time and stop time, and the number of movements that you felt between those two times. Take this information with you to your health care visits. 3. For 2 hours, count kicks, flutters, swishes, rolls, and jabs. You should feel at least 10 movements during 2 hours. 4. You may stop counting after you have felt 10 movements. 5. If you do not feel 10 movements in 2 hours, have something to eat and drink. Then, keep resting and counting for 1 hour. If you feel at least 4 movements during that hour, you may stop counting. Contact a health care provider if:  You feel fewer than 4 movements in 2 hours.  Your baby is not moving like he or she usually does. Date: ____________ Start time: ____________ Stop time: ____________ Movements: ____________ Date: ____________ Start time: ____________ Stop time: ____________ Movements: ____________ Date: ____________ Start time: ____________  Stop time: ____________ Movements: ____________ Date: ____________ Start time: ____________ Stop time: ____________ Movements: ____________ Date: ____________ Start time: ____________ Stop time: ____________ Movements: ____________ Date: ____________ Start time: ____________ Stop time: ____________ Movements: ____________ Date: ____________ Start time: ____________ Stop time: ____________ Movements: ____________ Date: ____________ Start time: ____________ Stop time: ____________ Movements: ____________ Date: ____________ Start time: ____________ Stop time: ____________ Movements: ____________ This information is not intended to replace advice given to you by your health care provider. Make sure you discuss any questions you have with your health care provider. Document Released: 07/05/2006 Document Revised: 02/02/2016 Document Reviewed: 07/15/2015 Elsevier Interactive Patient Education  2018 [employer]  Braxton Hicks Contractions Contractions of the uterus can occur throughout pregnancy, but they are not always a sign that you are in labor. You may have practice contractions called Braxton Hicks contractions. These false labor contractions are sometimes confused with true labor. What are Braxton Hicks contractions? Braxton Hicks contractions are tightening movements that occur in the muscles of the uterus before labor. Unlike true labor contractions, these contractions do not result in opening (dilation) and thinning of the cervix. Toward the end of pregnancy (32-34 weeks), Braxton Hicks contractions can happen more often and may become stronger. These contractions are sometimes difficult to tell apart from true labor because they can be very uncomfortable. You should not feel embarrassed if you go to the hospital with false labor. Sometimes, the only way to tell if you are in true labor is for your health care provider to look for changes in the cervix. The health care provider will   do a physical  exam and may monitor your contractions. If you are not in true labor, the exam should show that your cervix is not dilating and your water has not broken. If there are other health problems associated with your pregnancy, it is completely safe for you to be sent home with false labor. You may continue to have Braxton Hicks contractions until you go into true labor. How to tell the difference between true labor and false labor True labor  Contractions last 30-70 seconds.  Contractions become very regular.  Discomfort is usually felt in the top of the uterus, and it spreads to the lower abdomen and low back.  Contractions do not go away with walking.  Contractions usually become more intense and increase in frequency.  The cervix dilates and gets thinner. False labor  Contractions are usually shorter and not as strong as true labor contractions.  Contractions are usually irregular.  Contractions are often felt in the front of the lower abdomen and in the groin.  Contractions may go away when you walk around or change positions while lying down.  Contractions get weaker and are shorter-lasting as time goes on.  The cervix usually does not dilate or become thin. Follow these instructions at home:  Take over-the-counter and prescription medicines only as told by your health care provider.  Keep up with your usual exercises and follow other instructions from your health care provider.  Eat and drink lightly if you think you are going into labor.  If Braxton Hicks contractions are making you uncomfortable: ? Change your position from lying down or resting to walking, or change from walking to resting. ? Sit and rest in a tub of warm water. ? Drink enough fluid to keep your urine pale yellow. Dehydration may cause these contractions. ? Do slow and deep breathing several times an hour.  Keep all follow-up prenatal visits as told by your health care provider. This is  important. Contact a health care provider if:  You have a fever.  You have continuous pain in your abdomen. Get help right away if:  Your contractions become stronger, more regular, and closer together.  You have fluid leaking or gushing from your vagina.  You pass blood-tinged mucus (bloody show).  You have bleeding from your vagina.  You have low back pain that you never had before.  You feel your baby's head pushing down and causing pelvic pressure.  Your baby is not moving inside you as much as it used to. Summary  Contractions that occur before labor are called Braxton Hicks contractions, false labor, or practice contractions.  Braxton Hicks contractions are usually shorter, weaker, farther apart, and less regular than true labor contractions. True labor contractions usually become progressively stronger and regular and they become more frequent.  Manage discomfort from Braxton Hicks contractions by changing position, resting in a warm bath, drinking plenty of water, or practicing deep breathing. This information is not intended to replace advice given to you by your health care provider. Make sure you discuss any questions you have with your health care provider. Document Released: 10/19/2016 Document Revised: 10/19/2016 Document Reviewed: 10/19/2016 Elsevier Interactive Patient Education  2018 Elsevier Inc.    

## 2018-01-08 NOTE — Progress Notes (Signed)
   PRENATAL VISIT NOTE  Subjective:  Debra Sullivan is a 32 y.o. [phone] at 4166w4d being seen today for ongoing prenatal care.  She is currently monitored for the following issues for this high-risk pregnancy and has Psychosis (HCC); MDD (major depressive disorder), single episode, severe with psychosis (HCC); Cannabis use disorder, severe, dependence (HCC); PTSD (post-traumatic stress disorder); Cocaine use disorder, moderate, in early remission (HCC); Supervision of other normal pregnancy, antepartum; History of cesarean section complicating pregnancy; Unwanted fertility; Trichomonosis; Not immune to rubella; Drug dependence affecting pregnancy in third trimester; Suspected fetal anomaly, antepartum; Anemia during pregnancy in third trimester; and LGA (large for gestational age) fetus affecting management of mother, third trimester, fetus 1 on their problem list.  Patient reports no complaints.  Contractions: Not present. Vag. Bleeding: None.  Movement: Present. Denies leaking of fluid.   The following portions of the patient's history were reviewed and updated as appropriate: allergies, current medications, past family history, past medical history, past social history, past surgical history and problem list. Problem list updated.  Objective:   Vitals:   01/08/18 1021  BP: 129/78  Pulse: 96  Weight: 205 lb 3.2 oz (93.1 kg)    Fetal Status: Fetal Heart Rate (bpm): 145 Fundal Height: 36 cm Movement: Present     General:  Alert, oriented and cooperative. Patient is in no acute distress.  Skin: Skin is warm and dry. No rash noted.   Cardiovascular: Normal heart rate noted  Respiratory: Normal respiratory effort, no problems with respiration noted  Abdomen: Soft, gravid, appropriate for gestational age.  Pain/Pressure: Absent     Pelvic: Cervical exam deferred        Extremities: Normal range of motion.  Edema: None  Mental Status: Normal mood and affect. Normal behavior. Normal judgment  and thought content.   Assessment and Plan:  Pregnancy: [id number] at 3866w4d  1. Supervision of other normal pregnancy, antepartum - No complaints - RCS scheduled 01/25/18  2. Drug dependence affecting pregnancy in third trimester - Last tox screen was negative  3. Unwanted fertility - Planning BTL with RCS  4. Not immune to rubella - Plan for PP vaccination  5. LGA (large for gestational age) fetus affecting management of mother, third trimester, fetus 1 - EFW 84% at last US  Preterm labor symptoms and general obstetric precautions including but not limited to vaginal bleeding, contractions, leaking of fluid and fetal movement were reviewed in detail with the patient. Please refer to After Visit Summary for other counseling recommendations.  Return in about 1 week (around 01/15/2018) for LOB.  Future Appointments  Date Time Provider Department Center  01/15/2018  8:45 AM Currie ParisBurleson, Terri L, NP [facility] None    Vonzella NippleJulie Conan Mcmanaway, PA-C

## 2018-01-08 NOTE — Progress Notes (Signed)
Patient reports good fetal movement, denies pain. 

## 2018-01-10 ENCOUNTER — Telehealth (HOSPITAL_COMMUNITY): Payer: Self-pay | Admitting: *Deleted

## 2018-01-10 NOTE — H&P (Signed)
Debra Sullivan is an 32 y.o. [phone] 5152w6d female.   Chief Complaint: Previous C-section x 1  HPI: Previous c-section x 1 with desire for sterilization. Declines TOLAC.  Past Medical History:  Diagnosis Date  . Anemia   . Drug abuse (HCC)   . Medical history non-contributory   . Psychosis (HCC) 2013    Past Surgical History:  Procedure Laterality Date  . CESAREAN SECTION    . LAPAROSCOPY N/A 04/14/2017   Procedure: LAPAROSCOPY OPERATIVE WITH RIGHT SALPINGECTOMY;  Surgeon: Conan Bowensavis, Kelly M, MD;  Location: [facility];  Service: Gynecology;  Laterality: N/A;    Family History  Problem Relation Age of Onset  . Diabetes Mother   . Schizophrenia Mother   . Diabetes Brother   . Hypertension Maternal Aunt    Social History:  reports that she has been smoking cigarettes.  She has been smoking about 0.25 packs per day. She has never used smokeless tobacco. She reports that she does not drink alcohol or use drugs.    Allergies  Allergen Reactions  . Naproxen Anaphylaxis    No medications prior to admission.     A comprehensive review of systems was negative.  Last menstrual period 03/08/2017, unknown if currently breastfeeding. General appearance: alert, cooperative and appears stated age Head: Normocephalic, without obvious abnormality, atraumatic Neck: supple, symmetrical, trachea midline Lungs: normal effort Heart: regular rate and rhythm Abdomen: gravid, Non-tender Extremities: Homans sign is negative, no sign of DVT Skin: Skin color, texture, turgor normal. No rashes or lesions Neurologic: Grossly normal   Lab Results  Component Value Date   WBC 10.8 11/27/2017   HGB 10.6 (L) 11/27/2017   HCT 32.5 (L) 11/27/2017   MCV 77 (L) 11/27/2017   PLT 366 11/27/2017         ABO, Rh: A/Positive/-- (01/23 1541)  Antibody: Negative (01/23 1541)  Rubella: 0.97 (01/23 1541)  RPR: Non Reactive (06/11 1525)  HBsAg: Negative (01/23 1541)  HIV: Non Reactive (06/11 1525)   GBS: Negative (07/16 1048)     Assessment/Plan Principal Problem:   History of cesarean section complicating pregnancy Active Problems:   Unwanted fertility  For RLTCS and BTL--s/p right salpingectomy. Risks include but are not limited to bleeding, infection, injury to surrounding structures, including bowel, bladder and ureters, blood clots, and death.  Likelihood of success is high.   Debra Sullivan 01/10/2018, 10:20 AM

## 2018-01-10 NOTE — Telephone Encounter (Signed)
Preadmission screen  

## 2018-01-11 ENCOUNTER — Telehealth (HOSPITAL_COMMUNITY): Payer: Self-pay | Admitting: *Deleted

## 2018-01-11 NOTE — Telephone Encounter (Signed)
Preadmission screen  

## 2018-01-14 ENCOUNTER — Telehealth (HOSPITAL_COMMUNITY): Payer: Self-pay | Admitting: *Deleted

## 2018-01-14 NOTE — Telephone Encounter (Signed)
Preadmission screen  

## 2018-01-15 ENCOUNTER — Other Ambulatory Visit: Payer: Self-pay

## 2018-01-15 ENCOUNTER — Telehealth (HOSPITAL_COMMUNITY): Payer: Self-pay | Admitting: *Deleted

## 2018-01-15 ENCOUNTER — Ambulatory Visit (INDEPENDENT_AMBULATORY_CARE_PROVIDER_SITE_OTHER): Payer: Medicaid Other | Admitting: Nurse Practitioner

## 2018-01-15 VITALS — BP 110/76 | HR 87 | Wt 205.0 lb

## 2018-01-15 DIAGNOSIS — Z3483 Encounter for supervision of other normal pregnancy, third trimester: Secondary | ICD-10-CM

## 2018-01-15 DIAGNOSIS — O3663X0 Maternal care for excessive fetal growth, third trimester, not applicable or unspecified: Secondary | ICD-10-CM | POA: Diagnosis not present

## 2018-01-15 DIAGNOSIS — Z3480 Encounter for supervision of other normal pregnancy, unspecified trimester: Secondary | ICD-10-CM

## 2018-01-15 DIAGNOSIS — O34219 Maternal care for unspecified type scar from previous cesarean delivery: Secondary | ICD-10-CM

## 2018-01-15 NOTE — Progress Notes (Signed)
    Subjective:  Debra Sullivan is a 32 y.o. [phone] at 3017w4d being seen today for ongoing prenatal care.  She is currently monitored for the following issues for this high-risk pregnancy and has Psychosis (HCC); MDD (major depressive disorder), single episode, severe with psychosis (HCC); Cannabis use disorder, severe, dependence (HCC); PTSD (post-traumatic stress disorder); Cocaine use disorder, moderate, in early remission (HCC); Supervision of other normal pregnancy, antepartum; History of cesarean section complicating pregnancy; Unwanted fertility; Trichomonosis; Not immune to rubella; Drug dependence affecting pregnancy in third trimester; Suspected fetal anomaly, antepartum; Anemia during pregnancy in third trimester; and LGA (large for gestational age) fetus affecting management of mother, third trimester, fetus 1 on their problem list.  Patient reports having contractions last night.  Some contractions today but they are less but requesting to have cervix checked..  Contractions: Irregular. Vag. Bleeding: None.  Movement: Present. Denies leaking of fluid.   The following portions of the patient's history were reviewed and updated as appropriate: allergies, current medications, past family history, past medical history, past social history, past surgical history and problem list. Problem list updated.  Objective:   Vitals:   01/15/18 0852  BP: 110/76  Pulse: 87  Weight: 205 lb (93 kg)    Fetal Status: Fetal Heart Rate (bpm): 124 Fundal Height: 40 cm Movement: Present     General:  Alert, oriented and cooperative. Patient is in no acute distress.  Skin: Skin is warm and dry. No rash noted.   Cardiovascular: Normal heart rate noted  Respiratory: Normal respiratory effort, no problems with respiration noted  Abdomen: Soft, gravid, appropriate for gestational age. Pain/Pressure: Present     Pelvic:  Cervical exam performed Dilation: 3 Effacement (%): 80 Station: -2  Extremities:  Normal range of motion.  Edema: None  Mental Status: Normal mood and affect. Normal behavior. Normal judgment and thought content.   Urinalysis:      Assessment and Plan:  Pregnancy: [id number] at 5917w4d  1. Supervision of other normal pregnancy, antepartum NST done in the office - FHT baseline was 125 with moderate variability.  15x15 qaccels noted in FHT - reactive strip with no decelerations and occasional contractions.  Client reports they are not strong and not as much as her contractions were last night.   Consult with Dr. Debroah LoopArnold.  Will send home today as this does not appear to be labor.  Instructions given to go to the hospital if contractions start again or if there is vaginal bleeding or leaking of fluid.  2. History of cesarean section complicating pregnancy Has repeat C/S planned on 01-25-18 with preop visit on 01-24-18.  Plans BTL with surgery.   Signed BTL consent in chart.  Term labor symptoms and general obstetric precautions including but not limited to vaginal bleeding, contractions, leaking of fluid and fetal movement were reviewed in detail with the patient. Please refer to After Visit Summary for other counseling recommendations.  Return in about 1 week (around 01/22/2018).  Nolene BernheimERRI Simon Llamas, RN, MSN, NP-BC Nurse Practitioner, [facility] for [facility], [facility] 01/15/2018 9:22 AM

## 2018-01-15 NOTE — Telephone Encounter (Signed)
Preadmission screen  

## 2018-01-15 NOTE — Patient Instructions (Addendum)
call Mckinley Jewelmy Landon at [phone] for preop visit.

## 2018-01-16 ENCOUNTER — Encounter (HOSPITAL_COMMUNITY): Payer: Self-pay

## 2018-01-21 ENCOUNTER — Inpatient Hospital Stay (HOSPITAL_COMMUNITY): Payer: Medicaid Other | Admitting: Anesthesiology

## 2018-01-21 ENCOUNTER — Encounter (HOSPITAL_COMMUNITY): Payer: Self-pay

## 2018-01-21 ENCOUNTER — Inpatient Hospital Stay (HOSPITAL_COMMUNITY)
Admission: RE | Admit: 2018-01-21 | Discharge: 2018-01-23 | DRG: 798 | Disposition: A | Discharge status: Home / Self Care | Payer: Medicaid Other | Attending: Obstetrics and Gynecology | Admitting: Obstetrics and Gynecology

## 2018-01-21 DIAGNOSIS — Z302 Encounter for sterilization: Secondary | ICD-10-CM | POA: Diagnosis not present

## 2018-01-21 DIAGNOSIS — O34211 Maternal care for low transverse scar from previous cesarean delivery: Secondary | ICD-10-CM | POA: Diagnosis present

## 2018-01-21 DIAGNOSIS — Z87891 Personal history of nicotine dependence: Secondary | ICD-10-CM

## 2018-01-21 DIAGNOSIS — Z3A38 38 weeks gestation of pregnancy: Secondary | ICD-10-CM

## 2018-01-21 DIAGNOSIS — Z3009 Encounter for other general counseling and advice on contraception: Secondary | ICD-10-CM | POA: Diagnosis present

## 2018-01-21 DIAGNOSIS — O0990 Supervision of high risk pregnancy, unspecified, unspecified trimester: Secondary | ICD-10-CM

## 2018-01-21 DIAGNOSIS — O34219 Maternal care for unspecified type scar from previous cesarean delivery: Secondary | ICD-10-CM | POA: Diagnosis present

## 2018-01-21 LAB — CBC
HEMATOCRIT: 33 % — AB (ref 36.0–46.0)
HEMOGLOBIN: 10.6 g/dL — AB (ref 12.0–15.0)
MCH: 24.7 pg — AB (ref 26.0–34.0)
MCHC: 32.1 g/dL (ref 30.0–36.0)
MCV: 76.7 fL — ABNORMAL LOW (ref 78.0–100.0)
Platelets: 282 10*3/uL (ref 150–400)
RBC: 4.3 MIL/uL (ref 3.87–5.11)
RDW: 15.5 % (ref 11.5–15.5)
WBC: 10.5 10*3/uL (ref 4.0–10.5)

## 2018-01-21 LAB — RAPID URINE DRUG SCREEN, HOSP PERFORMED
AMPHETAMINES: NOT DETECTED
BARBITURATES: NOT DETECTED
Benzodiazepines: NOT DETECTED
Cocaine: NOT DETECTED
Opiates: NOT DETECTED
Tetrahydrocannabinol: NOT DETECTED

## 2018-01-21 LAB — TYPE AND SCREEN
ABO/RH(D): A POS
ANTIBODY SCREEN: NEGATIVE

## 2018-01-21 MED ORDER — OXYTOCIN 40 UNITS IN LACTATED RINGERS INFUSION - SIMPLE MED
2.5000 [IU]/h | INTRAVENOUS | Status: DC
  Filled 2018-01-21: qty 1000

## 2018-01-21 MED ORDER — EPHEDRINE 5 MG/ML INJ
10.0000 mg | INTRAVENOUS | Status: DC | PRN
  Filled 2018-01-21: qty 2

## 2018-01-21 MED ORDER — FENTANYL 2.5 MCG/ML BUPIVACAINE 1/10 % EPIDURAL INFUSION (WH - ANES)
14.0000 mL/h | INTRAMUSCULAR | Status: DC | PRN
  Administered 2018-01-21: 14 mL/h via EPIDURAL
  Filled 2018-01-21: qty 100

## 2018-01-21 MED ORDER — PHENYLEPHRINE 40 MCG/ML (10ML) SYRINGE FOR IV PUSH (FOR BLOOD PRESSURE SUPPORT)
80.0000 ug | PREFILLED_SYRINGE | INTRAVENOUS | Status: DC | PRN
  Filled 2018-01-21: qty 5

## 2018-01-21 MED ORDER — OXYTOCIN BOLUS FROM INFUSION
500.0000 mL | Freq: Once | INTRAVENOUS | Status: DC
  Administered 2018-01-22: 500 mL via INTRAVENOUS

## 2018-01-21 MED ORDER — FENTANYL CITRATE (PF) 100 MCG/2ML IJ SOLN: 50.0000 ug | INTRAMUSCULAR | Status: DC | PRN

## 2018-01-21 MED ORDER — SOD CITRATE-CITRIC ACID 500-334 MG/5ML PO SOLN: 30.0000 mL | ORAL | Status: DC | PRN

## 2018-01-21 MED ORDER — LACTATED RINGERS IV SOLN
500.0000 mL | Freq: Once | INTRAVENOUS | Status: AC
  Administered 2018-01-21: 500 mL via INTRAVENOUS

## 2018-01-21 MED ORDER — LACTATED RINGERS IV SOLN
INTRAVENOUS | Status: DC
Start: 2018-01-21 — End: 2018-01-22
  Administered 2018-01-21 (×2): via INTRAVENOUS

## 2018-01-21 MED ORDER — ACETAMINOPHEN 325 MG PO TABS: 650.0000 mg | ORAL_TABLET | ORAL | Status: DC | PRN

## 2018-01-21 MED ORDER — DIPHENHYDRAMINE HCL 50 MG/ML IJ SOLN: 12.5000 mg | INTRAMUSCULAR | Status: DC | PRN

## 2018-01-21 MED ORDER — PHENYLEPHRINE 40 MCG/ML (10ML) SYRINGE FOR IV PUSH (FOR BLOOD PRESSURE SUPPORT)
80.0000 ug | PREFILLED_SYRINGE | INTRAVENOUS | Status: DC | PRN
  Filled 2018-01-21: qty 5
  Filled 2018-01-21: qty 10

## 2018-01-21 MED ORDER — LIDOCAINE HCL (PF) 1 % IJ SOLN
30.0000 mL | INTRAMUSCULAR | Status: DC | PRN
  Filled 2018-01-21: qty 30

## 2018-01-21 MED ORDER — LACTATED RINGERS IV SOLN: 500.0000 mL | INTRAVENOUS | Status: DC | PRN

## 2018-01-21 MED ORDER — LIDOCAINE HCL (PF) 1 % IJ SOLN
INTRAMUSCULAR | Status: DC | PRN
  Administered 2018-01-21 (×2): 5 mL via EPIDURAL

## 2018-01-21 MED ORDER — ONDANSETRON HCL 4 MG/2ML IJ SOLN: 4.0000 mg | Freq: Four times a day (QID) | INTRAMUSCULAR | Status: DC | PRN

## 2018-01-21 NOTE — H&P (Signed)
Obstetrics Admission History & Physical  01/21/2018 - 7:52 PM Primary OBGYN: [facility]  Chief Complaint: SROM at 1830  History of Present Illness  32 y.o. [id number] @ [redacted]w[redacted]d, with the above CC. Pregnancy complicated by: h/o 03/2016 pLTCS for transverse twin b, h/o drug abuse, h/o psych issues  Ms. SANDRALEE TARKINGTON states above CC. Feels UCs q22m, no decreased FM  Review of Systems:  as noted in the History of Present Illness.  PMHx:  Past Medical History:  Diagnosis Date  . Anemia   . Drug abuse (HCC)   . Medical history non-contributory   . Psychosis (HCC) 2013   PSHx:  Past Surgical History:  Procedure Laterality Date  . CESAREAN SECTION    . LAPAROSCOPY N/A 04/14/2017   Procedure: LAPAROSCOPY OPERATIVE WITH RIGHT SALPINGECTOMY;  Surgeon: Conan Bowens, MD;  Location: [facility];  Service: Gynecology;  Laterality: N/A;   Medications:  Medications Prior to Admission  Medication Sig Dispense Refill Last Dose  . Prenatal Vit-Fe Fumarate-FA (PREPLUS) 27-1 MG TABS Take 1 tablet by mouth daily. 30 tablet 13 Taking     Allergies: is allergic to naproxen. OBHx:  OB History  Gravida Para Term Preterm AB Living  6 4 3 1 1 5   SAB TAB Ectopic Multiple Live Births  0   1 1 5     # Outcome Date GA Lbr Len/2nd Weight Sex Delivery Anes PTL Lv  6 Current           5 Ectopic 03/23/17             Birth Comments: System Generated. Please review and update pregnancy details.  4A Preterm 03/2016     Vag-Spont   LIV  4B Preterm 03/2016     CS-LTranv   LIV  3 Term 06/2012     Vag-Spont   LIV  2 Term 04/08/11 [redacted]w[redacted]d 03:56 / 00:20 7 lb 3.2 oz (3.265 kg) M Vag-Spont EPI  LIV     Birth Comments: wnl  1 Term 06/03/07    M Vag-Spont   LIV         FHx:  Family History  Problem Relation Age of Onset  . Diabetes Mother   . Schizophrenia Mother   . Diabetes Brother   . Hypertension Maternal Aunt    Soc Hx:  Social History   Socioeconomic History  . Marital status: Single    Spouse name: Not on  file  . Number of children: Not on file  . Years of education: Not on file  . Highest education level: Not on file  Occupational History  . Not on file  Social Needs  . Financial resource strain: Not on file  . Food insecurity:    Worry: Not on file    Inability: Not on file  . Transportation needs:    Medical: Not on file    Non-medical: Not on file  Tobacco Use  . Smoking status: Former Smoker    Packs/day: 0.25    Types: Cigarettes    Last attempt to quit: 10/03/2017    Years since quitting: 0.3  . Smokeless tobacco: Never Used  Substance and Sexual Activity  . Alcohol use: No    Alcohol/week: 0.0 oz  . Drug use: No    Types: Marijuana, Cocaine    Comment: Cocaine & Marijuana was used10/26/2018  . Sexual activity: Yes    Birth control/protection: None  Lifestyle  . Physical activity:    Days per week: Not on file  Minutes per session: Not on file  . Stress: Not on file  Relationships  . Social connections:    Talks on phone: Not on file    Gets together: Not on file    Attends religious service: Not on file    Active member of club or organization: Not on file    Attends meetings of clubs or organizations: Not on file    Relationship status: Not on file  . Intimate partner violence:    Fear of current or ex partner: Not on file    Emotionally abused: Not on file    Physically abused: Not on file    Forced sexual activity: Not on file  Other Topics Concern  . Not on file  Social History Narrative  . Not on file    Objective    Current Vital Signs 24h Vital Sign Ranges  T 98.5 F (36.9 C) Temp  Avg: 98.5 F (36.9 C)  Min: 98.5 F (36.9 C)  Max: 98.5 F (36.9 C)  BP 124/74 BP  Min: 124/74  Max: 124/74  HR (!) 108 Pulse  Avg: 108  Min: 108  Max: 108  RR   No data recorded  SaO2     No data recorded       24 Hour I/O Current Shift I/O  Time Ins Outs No intake/output data recorded. No intake/output data recorded.   EFM: 140 baseline, ?accel, no  decel, mod var  Toco: q2-3773m  General: Well nourished, well developed female in no acute distress.  Skin:  Warm and dry.  Cardiovascular: S1, S2 normal, no murmur, rub or gallop, regular rate and rhythm Respiratory:  Clear to auscultation bilateral. Normal respiratory effort Abdomen: gravid, nttp Neuro/Psych:  Normal mood and affect.   SVE: per RN 4/80/-2 @ 1930. Mild mec on pad  Labs  Pending.   Radiology 6/20: 84%, 2301gm, AC >97%, normal AF  Perinatal info  A pos/ Rubella  Not immune / Varicella Unknown/RPR pending/HIV negative/HepB Surf Ag negative/TDaP:yes /pap 06/2017 negative/  Assessment & Plan   31 y.o. [id number] @ 8021w3d with SROM, likely transitioning into active labor *Pregnancy: fetal status reassuring. Desires BTL. Papers UTD.  *Labor: d/w mom and I told her that she's a perfect TOLAC candidate. Largest prior approx 3300gm. Risks of tolac d/w her. She states that she wanted a rpt c-section because "she didn't want any pain." I told her that she can have an epidural in labor and that pain after a c-section is worse than after a VD. After d/w pt, she wants to tolac. Consider peds at delivery for mec. D/w pt re: pitocin augmentation and she is amenable to plan. Repeat SVE in 90-16172m and prn. If unchanged, consider iupc and possible pitocin augmentation *GBS: neg *Analgesia: desires epidural *Social: screening UDS ordered.   Cornelia Copaharlie Jessicia Napolitano, Jr. MD Attending Center for [facility](Faculty Practice)

## 2018-01-21 NOTE — H&P (Signed)
Debra Sullivan is a 32 y.o. female [phone] @[redacted]w[redacted]d  presenting for SROM with onset of active labor.  She has hx of C/S x 1 with second twin with her last pregnancy.  First twin, and her other 3 deliveries were vaginal.  She initially desired RLTCS but on admission today desires TOLAC.  Benefits/risks of TOLAC vs repeat C/S reviewed with pt and consent for TOLAC signed by pt on admission.  Other notable hx include major depression, PTSD, and cocaine use. Pt drug screen negative @ 30 weeks. UDS ordered on admission.   Clinic [facility] Prenatal Labs  Dating U/S Blood type: A/Positive/-- (01/23 1541)   Genetic Screen  Too late/declined Antibody:Negative (01/23 1541)  Anatomic [place] 09/07/17; normal female fetus EFW: 84% Rubella: 0.97 (01/23 1541)  GTT   Third trimester: 1 hr OGTT WNL RPR: Non Reactive (06/11 1525)   Flu vaccine Declined 08-14-17 HBsAg: Negative (01/23 1541)   TDaP vaccine 01-01-18                                          Rhogam:n/a A+ HIV: Non Reactive (06/11 1525)  Baby Food Breast/Bottle                                             JYN:[id number] (07/16 1048)Negative  Contraception BTL Pap: 06/2017 normal   Circumcision yes   Pediatrician [facility] CF:Neg  Support Person fob   Prenatal Classes No  Hgb electrophoresis: Normal    OB History    Gravida  6   Para  4   Term  3   Preterm  1   AB  1   Living  5     SAB  0   TAB      Ectopic  1   Multiple  1   Live Births  5          Past Medical History:  Diagnosis Date  . Anemia   . Drug abuse (HCC)   . Psychosis (HCC) 2013   Past Surgical History:  Procedure Laterality Date  . CESAREAN SECTION  03/2016   [facility]  . LAPAROSCOPY N/A 04/14/2017   Procedure: LAPAROSCOPY OPERATIVE WITH RIGHT SALPINGECTOMY;  Surgeon: Conan Bowens, MD;  Location: [facility];  Service: Gynecology;  Laterality: N/A;   Family History: family history includes Diabetes in her brother and mother; Hypertension  in her maternal aunt; Schizophrenia in her mother. Social History:  reports that she quit smoking about 3 months ago. Her smoking use included cigarettes. She smoked 0.25 packs per day. She has never used smokeless tobacco. She reports that she does not drink alcohol or use drugs.     Maternal Diabetes: No Genetic Screening: Normal Maternal Ultrasounds/Referrals: Normal Fetal Ultrasounds or other Referrals:  None Maternal Substance Abuse:  Yes:  Type: Cocaine Significant Maternal Medications:  None Significant Maternal Lab Results:  Lab values include: Group B Strep negative Other Comments:  Maternal drug screen negative x2 in prenatal care, drug testing repeated on hospital admission.  Review of Systems  Constitutional: Negative for chills, fever and malaise/fatigue.  Eyes: Negative for blurred vision.  Respiratory: Negative for cough and shortness of breath.   Cardiovascular: Negative for chest pain.  Gastrointestinal: Positive  for abdominal pain. Negative for heartburn and vomiting.  Genitourinary: Negative for dysuria, frequency and urgency.  Musculoskeletal: Negative.   Neurological: Negative for dizziness and headaches.  Psychiatric/Behavioral: Negative for depression.   Maternal Medical History:  Reason for admission: Rupture of membranes and contractions.   Contractions: Onset was 3-5 hours ago.   Frequency: regular.   Perceived severity is moderate.    Fetal activity: Perceived fetal activity is normal.   Last perceived fetal movement was within the past hour.    Prenatal complications: no prenatal complications Substance abuse, cocaine, prior to pregnancy, last noted in 03/2017  Prenatal Complications - Diabetes: none.    Dilation: 6 Effacement (%): 80 Station: -2 Exam by:: Estill BambergLisa Kirby, CNM Blood pressure 99/62, pulse 91, temperature 98.1 F (36.7 C), temperature source Oral, last menstrual period 03/08/2017, SpO2 99 %, unknown if currently  breastfeeding. Maternal Exam:  Uterine Assessment: Contraction strength is moderate.  Contraction frequency is regular.   Abdomen: Fetal presentation: vertex  Introitus: Ferning test: positive.  Amniotic fluid character: meconium stained.  Pelvis: adequate for delivery.   Cervix: Cervix evaluated by digital exam.     Fetal Exam Fetal Monitor Review: Mode: ultrasound.   Baseline rate: 135.  Variability: moderate (6-25 bpm).   Pattern: accelerations present and variable decelerations.   Isolated variable decelerations, resolved with pt position change  Fetal State Assessment: Category I - tracings are normal.     Physical Exam  Nursing note and vitals reviewed. Constitutional: She is oriented to person, place, and time. She appears well-developed and well-nourished.  Neck: Normal range of motion.  Cardiovascular: Normal rate, regular rhythm and normal heart sounds.  Respiratory: Effort normal and breath sounds normal.  GI: Soft.  Musculoskeletal: Normal range of motion.  Neurological: She is alert and oriented to person, place, and time.  Skin: Skin is warm and dry.  Psychiatric: She has a normal mood and affect. Her behavior is normal. Judgment and thought content normal.    Prenatal labs: ABO, Rh: --/--/A POS (08/05 2013) Antibody: NEG (08/05 2013) Rubella: 0.97 (01/23 1541) RPR: Non Reactive (06/11 1525)  HBsAg: Negative (01/23 1541)  HIV: Non Reactive (06/11 1525)  GBS: Negative (07/16 1048)   Assessment/Plan: SROM with onset of active labor TOLAC with hx vaginal delivery x 3 followed by delivery of vaginal twin then C/S of second twin Meconium stained fluid, light GBS negative  Admit to birthing suites Epidural when desired Continuous fetal monitoring Expectant management for labor  Sharen CounterLisa Leftwich-Kirby 01/21/2018, 9:45 PM

## 2018-01-21 NOTE — MAU Note (Signed)
Pt reports SROM thick mec at 1830. Sched RCS on Friday.   Some mild cntx since then.

## 2018-01-21 NOTE — Anesthesia Procedure Notes (Signed)
Epidural Patient location during procedure: OB Start time: 01/21/2018 8:51 PM End time: 01/21/2018 9:07 PM  Staffing Anesthesiologist: Heather RobertsSinger, Aribella Vavra, MD Performed: anesthesiologist   Preanesthetic Checklist Completed: patient identified, site marked, pre-op evaluation, timeout performed, IV checked, risks and benefits discussed and monitors and equipment checked  Epidural Patient position: sitting Prep: DuraPrep Patient monitoring: heart rate, cardiac monitor, continuous pulse ox and blood pressure Approach: midline Location: L2-L3 Injection technique: LOR saline  Needle:  Needle type: Tuohy  Needle gauge: 17 G Needle length: 9 cm Needle insertion depth: 6 cm Catheter size: 20 Guage Catheter at skin depth: 11 cm Test dose: negative and Other  Assessment Events: blood not aspirated, injection not painful, no injection resistance and negative IV test  Additional Notes Informed consent obtained prior to proceeding including risk of failure, 1% risk of PDPH, risk of minor discomfort and bruising.  Discussed rare but serious complications including epidural abscess, permanent nerve injury, epidural hematoma.  Discussed alternatives to epidural analgesia and patient desires to proceed.  Timeout performed pre-procedure verifying patient name, procedure, and platelet count.  Patient tolerated procedure well.

## 2018-01-21 NOTE — Anesthesia Preprocedure Evaluation (Signed)
Anesthesia Evaluation  Patient identified by MRN, date of birth, ID band Patient awake    Reviewed: Allergy & Precautions, NPO status , Patient's Chart, lab work & pertinent test results  History of Anesthesia Complications Negative for: history of anesthetic complications  Airway Mallampati: III  TM Distance: >3 FB Neck ROM: Full    Dental no notable dental hx. (+) Dental Advisory Given   Pulmonary former smoker,    Pulmonary exam normal        Cardiovascular negative cardio ROS Normal cardiovascular exam     Neuro/Psych PSYCHIATRIC DISORDERS Anxiety Depression negative neurological ROS     GI/Hepatic negative GI ROS, (+)     substance abuse  ,   Endo/Other  negative endocrine ROS  Renal/GU negative Renal ROS  negative genitourinary   Musculoskeletal negative musculoskeletal ROS (+)   Abdominal   Peds negative pediatric ROS (+)  Hematology negative hematology ROS (+)   Anesthesia Other Findings   Reproductive/Obstetrics (+) Pregnancy                             Anesthesia Physical Anesthesia Plan  ASA: II  Anesthesia Plan: Epidural   Post-op Pain Management:    Induction:   PONV Risk Score and Plan:   Airway Management Planned: Natural Airway  Additional Equipment:   Intra-op Plan:   Post-operative Plan:   Informed Consent: I have reviewed the patients History and Physical, chart, labs and discussed the procedure including the risks, benefits and alternatives for the proposed anesthesia with the patient or authorized representative who has indicated his/her understanding and acceptance.   Dental advisory given  Plan Discussed with: Anesthesiologist  Anesthesia Plan Comments:         Anesthesia Quick Evaluation

## 2018-01-22 ENCOUNTER — Encounter: Payer: Self-pay | Admitting: Obstetrics

## 2018-01-22 ENCOUNTER — Encounter (HOSPITAL_COMMUNITY)
Admission: RE | Disposition: A | Discharge status: Home / Self Care | Payer: Self-pay | Source: Home / Self Care | Attending: Obstetrics and Gynecology

## 2018-01-22 ENCOUNTER — Encounter (HOSPITAL_COMMUNITY): Payer: Self-pay

## 2018-01-22 ENCOUNTER — Inpatient Hospital Stay (HOSPITAL_COMMUNITY): Payer: Medicaid Other | Admitting: Certified Registered Nurse Anesthetist

## 2018-01-22 DIAGNOSIS — Z3A38 38 weeks gestation of pregnancy: Secondary | ICD-10-CM

## 2018-01-22 DIAGNOSIS — Z302 Encounter for sterilization: Secondary | ICD-10-CM

## 2018-01-22 DIAGNOSIS — O34219 Maternal care for unspecified type scar from previous cesarean delivery: Secondary | ICD-10-CM

## 2018-01-22 HISTORY — PX: TUBAL LIGATION: SHX77

## 2018-01-22 LAB — RPR: RPR: NONREACTIVE

## 2018-01-22 SURGERY — LIGATION, FALLOPIAN TUBE, POSTPARTUM
Anesthesia: General | Laterality: Bilateral

## 2018-01-22 SURGERY — Surgical Case
Anesthesia: Regional | Laterality: Bilateral

## 2018-01-22 MED ORDER — ZOLPIDEM TARTRATE 5 MG PO TABS: 5.0000 mg | ORAL_TABLET | Freq: Every evening | ORAL | Status: DC | PRN

## 2018-01-22 MED ORDER — OXYCODONE HCL 5 MG PO TABS
5.0000 mg | ORAL_TABLET | ORAL | Status: DC | PRN
  Administered 2018-01-22 – 2018-01-23 (×4): 5 mg via ORAL
  Filled 2018-01-22 (×5): qty 1

## 2018-01-22 MED ORDER — HYDROMORPHONE HCL 1 MG/ML IJ SOLN
0.2500 mg | INTRAMUSCULAR | Status: DC | PRN
  Administered 2018-01-22 (×2): 0.25 mg via INTRAVENOUS

## 2018-01-22 MED ORDER — NALBUPHINE HCL 10 MG/ML IJ SOLN: 5.0000 mg | Freq: Once | INTRAMUSCULAR | Status: DC | PRN

## 2018-01-22 MED ORDER — GLYCOPYRROLATE 0.2 MG/ML IJ SOLN
INTRAMUSCULAR | Status: DC | PRN
  Administered 2018-01-22 (×2): 0.1 mg via INTRAVENOUS

## 2018-01-22 MED ORDER — MIDAZOLAM HCL 2 MG/2ML IJ SOLN
INTRAMUSCULAR | Status: DC | PRN
  Administered 2018-01-22: 2 mg via INTRAVENOUS

## 2018-01-22 MED ORDER — HYDROCODONE-ACETAMINOPHEN 7.5-325 MG PO TABS: 1.0000 | ORAL_TABLET | Freq: Once | ORAL | Status: DC | PRN

## 2018-01-22 MED ORDER — ONDANSETRON HCL 4 MG/2ML IJ SOLN
INTRAMUSCULAR | Status: AC
  Filled 2018-01-22: qty 2

## 2018-01-22 MED ORDER — ACETAMINOPHEN 10 MG/ML IV SOLN
INTRAVENOUS | Status: AC
  Filled 2018-01-22: qty 100

## 2018-01-22 MED ORDER — SCOPOLAMINE 1 MG/3DAYS TD PT72
1.0000 | MEDICATED_PATCH | Freq: Once | TRANSDERMAL | Status: DC
  Filled 2018-01-22: qty 1

## 2018-01-22 MED ORDER — LIDOCAINE HCL (CARDIAC) PF 100 MG/5ML IV SOSY
PREFILLED_SYRINGE | INTRAVENOUS | Status: DC | PRN
  Administered 2018-01-22: 100 mg via INTRAVENOUS

## 2018-01-22 MED ORDER — DIPHENHYDRAMINE HCL 50 MG/ML IJ SOLN: 12.5000 mg | INTRAMUSCULAR | Status: DC | PRN

## 2018-01-22 MED ORDER — PROPOFOL 10 MG/ML IV BOLUS
INTRAVENOUS | Status: AC
  Filled 2018-01-22: qty 20

## 2018-01-22 MED ORDER — KETAMINE HCL 10 MG/ML IJ SOLN
INTRAMUSCULAR | Status: DC | PRN
  Administered 2018-01-22 (×2): 20 mg via INTRAVENOUS

## 2018-01-22 MED ORDER — DIPHENHYDRAMINE HCL 25 MG PO CAPS: 25.0000 mg | ORAL_CAPSULE | Freq: Four times a day (QID) | ORAL | Status: DC | PRN

## 2018-01-22 MED ORDER — BENZOCAINE-MENTHOL 20-0.5 % EX AERO: 1.0000 "application " | INHALATION_SPRAY | CUTANEOUS | Status: DC | PRN

## 2018-01-22 MED ORDER — PROMETHAZINE HCL 25 MG/ML IJ SOLN
6.2500 mg | INTRAMUSCULAR | Status: DC | PRN
  Administered 2018-01-22: 6.25 mg via INTRAVENOUS

## 2018-01-22 MED ORDER — NALBUPHINE HCL 10 MG/ML IJ SOLN: 5.0000 mg | INTRAMUSCULAR | Status: DC | PRN

## 2018-01-22 MED ORDER — HYDROMORPHONE HCL 1 MG/ML IJ SOLN
0.2500 mg | INTRAMUSCULAR | Status: DC | PRN
  Administered 2018-01-22: 0.25 mg via INTRAVENOUS

## 2018-01-22 MED ORDER — FENTANYL CITRATE (PF) 100 MCG/2ML IJ SOLN: 25.0000 ug | INTRAMUSCULAR | Status: DC | PRN

## 2018-01-22 MED ORDER — MEPERIDINE HCL 25 MG/ML IJ SOLN: 6.2500 mg | INTRAMUSCULAR | Status: DC | PRN

## 2018-01-22 MED ORDER — BUPIVACAINE HCL (PF) 0.25 % IJ SOLN
INTRAMUSCULAR | Status: DC | PRN
  Administered 2018-01-22: 20 mL

## 2018-01-22 MED ORDER — SODIUM CHLORIDE 0.9% FLUSH: 3.0000 mL | INTRAVENOUS | Status: DC | PRN

## 2018-01-22 MED ORDER — ACETAMINOPHEN 325 MG PO TABS
650.0000 mg | ORAL_TABLET | ORAL | Status: DC | PRN
  Administered 2018-01-22 – 2018-01-23 (×2): 650 mg via ORAL
  Filled 2018-01-22 (×3): qty 2

## 2018-01-22 MED ORDER — ONDANSETRON HCL 4 MG/2ML IJ SOLN
4.0000 mg | INTRAMUSCULAR | Status: DC | PRN
  Administered 2018-01-22: 4 mg via INTRAVENOUS

## 2018-01-22 MED ORDER — ACETAMINOPHEN 10 MG/ML IV SOLN
1000.0000 mg | Freq: Once | INTRAVENOUS | Status: DC | PRN
  Administered 2018-01-22: 1000 mg via INTRAVENOUS
  Filled 2018-01-22: qty 100

## 2018-01-22 MED ORDER — NALOXONE HCL 4 MG/10ML IJ SOLN: 1.0000 ug/kg/h | INTRAVENOUS | Status: DC | PRN

## 2018-01-22 MED ORDER — SCOPOLAMINE 1 MG/3DAYS TD PT72
MEDICATED_PATCH | TRANSDERMAL | Status: DC | PRN
  Administered 2018-01-22: 1 via TRANSDERMAL

## 2018-01-22 MED ORDER — SUCCINYLCHOLINE CHLORIDE 200 MG/10ML IV SOSY
PREFILLED_SYRINGE | INTRAVENOUS | Status: AC
  Filled 2018-01-22: qty 10

## 2018-01-22 MED ORDER — PROMETHAZINE HCL 25 MG/ML IJ SOLN: 6.2500 mg | INTRAMUSCULAR | Status: DC | PRN

## 2018-01-22 MED ORDER — DIBUCAINE 1 % RE OINT: 1.0000 "application " | TOPICAL_OINTMENT | RECTAL | Status: DC | PRN

## 2018-01-22 MED ORDER — SUCCINYLCHOLINE CHLORIDE 20 MG/ML IJ SOLN
INTRAMUSCULAR | Status: DC | PRN
  Administered 2018-01-22: 80 mg via INTRAVENOUS

## 2018-01-22 MED ORDER — ACETAMINOPHEN 10 MG/ML IV SOLN: 1000.0000 mg | Freq: Once | INTRAVENOUS | Status: DC | PRN

## 2018-01-22 MED ORDER — SIMETHICONE 80 MG PO CHEW: 80.0000 mg | CHEWABLE_TABLET | ORAL | Status: DC | PRN

## 2018-01-22 MED ORDER — PRENATAL MULTIVITAMIN CH
1.0000 | ORAL_TABLET | Freq: Every day | ORAL | Status: DC
  Administered 2018-01-22 – 2018-01-23 (×2): 1 via ORAL
  Filled 2018-01-22 (×2): qty 1

## 2018-01-22 MED ORDER — COCONUT OIL OIL: 1.0000 "application " | TOPICAL_OIL | Status: DC | PRN

## 2018-01-22 MED ORDER — FAMOTIDINE 20 MG PO TABS
40.0000 mg | ORAL_TABLET | Freq: Once | ORAL | Status: AC
  Administered 2018-01-22: 40 mg via ORAL
  Filled 2018-01-22: qty 2

## 2018-01-22 MED ORDER — NALOXONE HCL 0.4 MG/ML IJ SOLN: 0.4000 mg | INTRAMUSCULAR | Status: DC | PRN

## 2018-01-22 MED ORDER — GLYCOPYRROLATE 0.2 MG/ML IJ SOLN
INTRAMUSCULAR | Status: AC
  Filled 2018-01-22: qty 1

## 2018-01-22 MED ORDER — SENNOSIDES-DOCUSATE SODIUM 8.6-50 MG PO TABS
2.0000 | ORAL_TABLET | ORAL | Status: DC
  Administered 2018-01-23: 2 via ORAL
  Filled 2018-01-22: qty 2

## 2018-01-22 MED ORDER — DEXAMETHASONE SODIUM PHOSPHATE 10 MG/ML IJ SOLN
INTRAMUSCULAR | Status: DC | PRN
  Administered 2018-01-22: 4 mg via INTRAVENOUS

## 2018-01-22 MED ORDER — KETAMINE HCL 10 MG/ML IJ SOLN
INTRAMUSCULAR | Status: AC
  Filled 2018-01-22: qty 1

## 2018-01-22 MED ORDER — DIPHENHYDRAMINE HCL 25 MG PO CAPS: 25.0000 mg | ORAL_CAPSULE | ORAL | Status: DC | PRN

## 2018-01-22 MED ORDER — ONDANSETRON HCL 4 MG PO TABS: 4.0000 mg | ORAL_TABLET | ORAL | Status: DC | PRN

## 2018-01-22 MED ORDER — HYDROMORPHONE HCL 1 MG/ML IJ SOLN
INTRAMUSCULAR | Status: AC
  Filled 2018-01-22: qty 0.5

## 2018-01-22 MED ORDER — WITCH HAZEL-GLYCERIN EX PADS: 1.0000 "application " | MEDICATED_PAD | CUTANEOUS | Status: DC | PRN

## 2018-01-22 MED ORDER — FENTANYL CITRATE (PF) 100 MCG/2ML IJ SOLN
INTRAMUSCULAR | Status: DC | PRN
  Administered 2018-01-22: 50 ug via INTRAVENOUS
  Administered 2018-01-22: 100 ug via INTRAVENOUS
  Administered 2018-01-22 (×2): 50 ug via INTRAVENOUS

## 2018-01-22 MED ORDER — MIDAZOLAM HCL 2 MG/2ML IJ SOLN
INTRAMUSCULAR | Status: AC
  Filled 2018-01-22: qty 2

## 2018-01-22 MED ORDER — PROPOFOL 10 MG/ML IV BOLUS
INTRAVENOUS | Status: DC | PRN
  Administered 2018-01-22: 150 mg via INTRAVENOUS
  Administered 2018-01-22: 50 mg via INTRAVENOUS

## 2018-01-22 MED ORDER — LIDOCAINE HCL (CARDIAC) PF 100 MG/5ML IV SOSY
PREFILLED_SYRINGE | INTRAVENOUS | Status: AC
  Filled 2018-01-22: qty 5

## 2018-01-22 MED ORDER — ONDANSETRON HCL 4 MG/2ML IJ SOLN: 4.0000 mg | Freq: Three times a day (TID) | INTRAMUSCULAR | Status: DC | PRN

## 2018-01-22 MED ORDER — BUPIVACAINE HCL (PF) 0.25 % IJ SOLN
INTRAMUSCULAR | Status: AC
  Filled 2018-01-22: qty 30

## 2018-01-22 MED ORDER — PROMETHAZINE HCL 25 MG/ML IJ SOLN
INTRAMUSCULAR | Status: AC
  Filled 2018-01-22: qty 1

## 2018-01-22 MED ORDER — BUPIVACAINE IN DEXTROSE 0.75-8.25 % IT SOLN
INTRATHECAL | Status: AC
  Filled 2018-01-22: qty 2

## 2018-01-22 MED ORDER — LACTATED RINGERS IV SOLN
INTRAVENOUS | Status: DC
  Administered 2018-01-22: 15:00:00 via INTRAVENOUS

## 2018-01-22 MED ORDER — METOCLOPRAMIDE HCL 10 MG PO TABS
10.0000 mg | ORAL_TABLET | Freq: Once | ORAL | Status: AC
  Administered 2018-01-22: 10 mg via ORAL
  Filled 2018-01-22: qty 1

## 2018-01-22 MED ORDER — FENTANYL CITRATE (PF) 250 MCG/5ML IJ SOLN
INTRAMUSCULAR | Status: AC
Start: 2018-01-22 — End: ?
  Filled 2018-01-22: qty 5

## 2018-01-22 MED ORDER — TETANUS-DIPHTH-ACELL PERTUSSIS 5-2.5-18.5 LF-MCG/0.5 IM SUSP: 0.5000 mL | Freq: Once | INTRAMUSCULAR | Status: DC

## 2018-01-22 MED ORDER — HYDROMORPHONE HCL 1 MG/ML IJ SOLN
INTRAMUSCULAR | Status: AC
  Administered 2018-01-22: 0.25 mg via INTRAVENOUS
  Filled 2018-01-22: qty 0.5

## 2018-01-22 SURGICAL SUPPLY — 24 items
BLADE SURG 11 STRL SS (BLADE) ×3 IMPLANT
CLOTH BEACON ORANGE TIMEOUT ST (SAFETY) ×3 IMPLANT
DRSG OPSITE 4X5.5 SM (GAUZE/BANDAGES/DRESSINGS) ×2 IMPLANT
DRSG OPSITE POSTOP 3X4 (GAUZE/BANDAGES/DRESSINGS) ×3 IMPLANT
DURAPREP 26ML APPLICATOR (WOUND CARE) ×3 IMPLANT
GLOVE BIOGEL PI IND STRL 7.0 (GLOVE) ×1 IMPLANT
GLOVE BIOGEL PI IND STRL 7.5 (GLOVE) ×1 IMPLANT
GLOVE BIOGEL PI INDICATOR 7.0 (GLOVE) ×2
GLOVE BIOGEL PI INDICATOR 7.5 (GLOVE) ×2
GLOVE ECLIPSE 7.5 STRL STRAW (GLOVE) ×3 IMPLANT
GOWN STRL REUS W/TWL LRG LVL3 (GOWN DISPOSABLE) ×6 IMPLANT
NEEDLE HYPO 22GX1.5 SAFETY (NEEDLE) ×3 IMPLANT
NS IRRIG 1000ML POUR BTL (IV SOLUTION) ×3 IMPLANT
PACK ABDOMINAL MINOR (CUSTOM PROCEDURE TRAY) ×3 IMPLANT
PROTECTOR NERVE ULNAR (MISCELLANEOUS) ×3 IMPLANT
SPONGE LAP 4X18 X RAY DECT (DISPOSABLE) IMPLANT
SUT PLAIN 2 0 (SUTURE) ×3
SUT PLAIN ABS 2-0 54XMFL TIE (SUTURE) ×1 IMPLANT
SUT VICRYL 0 UR6 27IN ABS (SUTURE) ×3 IMPLANT
SUT VICRYL 4-0 PS2 18IN ABS (SUTURE) ×3 IMPLANT
SYR CONTROL 10ML LL (SYRINGE) ×3 IMPLANT
TOWEL OR 17X24 6PK STRL BLUE (TOWEL DISPOSABLE) ×6 IMPLANT
TRAY FOLEY CATH SILVER 14FR (SET/KITS/TRAYS/PACK) ×3 IMPLANT
WATER STERILE IRR 1000ML POUR (IV SOLUTION) ×3 IMPLANT

## 2018-01-22 NOTE — Anesthesia Preprocedure Evaluation (Signed)
Anesthesia Evaluation  Patient identified by MRN, date of birth, ID band Patient awake    Reviewed: Allergy & Precautions, NPO status , Patient's Chart, lab work & pertinent test results  History of Anesthesia Complications Negative for: history of anesthetic complications  Airway Mallampati: III  TM Distance: >3 FB Neck ROM: Full    Dental no notable dental hx. (+) Dental Advisory Given, Teeth Intact   Pulmonary former smoker,    Pulmonary exam normal        Cardiovascular negative cardio ROS Normal cardiovascular exam     Neuro/Psych PSYCHIATRIC DISORDERS Anxiety Depression negative neurological ROS     GI/Hepatic negative GI ROS, (+)     substance abuse  ,   Endo/Other  negative endocrine ROS  Renal/GU negative Renal ROS  negative genitourinary   Musculoskeletal negative musculoskeletal ROS (+)   Abdominal   Peds negative pediatric ROS (+)  Hematology negative hematology ROS (+)   Anesthesia Other Findings   Reproductive/Obstetrics                             Anesthesia Physical Anesthesia Plan  ASA: III  Anesthesia Plan: General   Post-op Pain Management:    Induction: Intravenous and Cricoid pressure planned  PONV Risk Score and Plan:   Airway Management Planned: Oral ETT  Additional Equipment:   Intra-op Plan:   Post-operative Plan: Extubation in OR  Informed Consent: I have reviewed the patients History and Physical, chart, labs and discussed the procedure including the risks, benefits and alternatives for the proposed anesthesia with the patient or authorized representative who has indicated his/her understanding and acceptance.   Dental advisory given  Plan Discussed with: CRNA and Anesthesiologist  Anesthesia Plan Comments:         Anesthesia Quick Evaluation

## 2018-01-22 NOTE — Anesthesia Procedure Notes (Addendum)
Procedure Name: Intubation Date/Time: 01/22/2018 5:36 PM Performed by: Purvis Kilts, CRNA Pre-anesthesia Checklist: Patient identified, Emergency Drugs available, Suction available and Patient being monitored Patient Re-evaluated:Patient Re-evaluated prior to induction Oxygen Delivery Method: Circle system utilized Preoxygenation: Pre-oxygenation with 100% oxygen Induction Type: IV induction, Cricoid Pressure applied and Rapid sequence Laryngoscope Size: Mac and 3 Grade View: Grade II Tube type: Oral Tube size: 7.0 mm Number of attempts: 1 Airway Equipment and Method: Stylet Placement Confirmation: ETT inserted through vocal cords under direct vision,  positive ETCO2 and breath sounds checked- equal and bilateral Secured at: 21 cm Tube secured with: Tape Dental Injury: Teeth and Oropharynx as per pre-operative assessment

## 2018-01-22 NOTE — Transfer of Care (Signed)
Immediate Anesthesia Transfer of Care Note  Patient: Debra Sullivan  Procedure(s) Performed: POST PARTUM TUBAL LIGATION (Bilateral )  Patient Location: PACU  Anesthesia Type:General  Level of Consciousness: awake  Airway & Oxygen Therapy: Patient Spontanous Breathing  Post-op Assessment: Report given to RN and Post -op Vital signs reviewed and stable  Post vital signs: Reviewed and stable  Last Vitals:  Vitals Value Taken Time  BP    Temp    Pulse 91 01/22/2018  6:48 PM  Resp 11 01/22/2018  6:48 PM  SpO2 97 % 01/22/2018  6:48 PM  Vitals shown include unvalidated device data.  Last Pain:  Vitals:   01/22/18 1444  TempSrc: Oral  PainSc: 0-No pain         Complications: No apparent anesthesia complications

## 2018-01-22 NOTE — Op Note (Addendum)
Debra Sullivan 01/21/2018 - 01/22/2018  PREOPERATIVE DIAGNOSIS:  Undesired fertility  POSTOPERATIVE DIAGNOSIS:  Undesired fertility  PROCEDURE:  Postpartum Bilateral Tubal Sterilization using Filshie Clips   SURGEON:  Dr Candelaria CelesteJacob Salman Wellen  Assistant: Dr Marcy Sirenatherine Wallace  ANESTHESIA:  Epidural  COMPLICATIONS:  None immediate.  ESTIMATED BLOOD LOSS:  Less than 20cc.  FLUIDS: 700 mL LR.  URINE OUTPUT:  unmeasure amount of clear urine.  INDICATIONS: 32 y.o. yo [id number]  with undesired fertility,status post vaginal delivery, desires permanent sterilization. Risks and benefits of procedure discussed with patient including permanence of method, bleeding, infection, injury to surrounding organs and need for additional procedures. Risk failure of 0.5-1% with increased risk of ectopic gestation if pregnancy occurs was also discussed with patient.   FINDINGS:  Normal uterus, left tubes, and ovaries. Right dilated tubular structure.  TECHNIQUE:  The patient was taken to the operating room where her epidural anesthesia was dosed up to surgical level and found to be adequate.  She was then placed in the dorsal supine position and prepped and draped in sterile fashion.  After an adequate timeout was performed, attention was turned to the patient's abdomen where a small transverse skin incision was made under the umbilical fold. The incision was taken down to the layer of fascia using the scalpel, and fascia was incised, and extended bilaterally using Mayo scissors. The peritoneum was entered in a sharp fashion. Attention was then turned to the patient's uterus, and right fallopian tube was identified and followed out to the fimbriated end.  The right tube was extremely dilated and malformed, but scant fimbriae was seen at the end. A 3 cm segment of the tube was then doubly ligated with free tie of plain gut suture, transected and excised. Good hemostasis was noted. The left fallopian tube was then identified,  doubly ligated, and a 3 cm segment excised in a similar fashion allowing for bilateral tubal sterilization. Excellent hemostasis was noted. The instruments were then removed from the patient's abdomen and the fascial incision was repaired with 0 Vicryl, and the skin was closed with a 3-0 monocryl subcuticular stitch. The patient tolerated the procedure well.  Sponge, lap, and needle counts were correct times two.  The patient was then taken to the recovery room awake, extubated and in stable condition.   Levie HeritageStinson, [name], DO 01/22/2018 6:28 PM

## 2018-01-22 NOTE — Lactation Note (Signed)
This note was copied from a baby's chart. Lactation Consultation Note  Patient Name: Debra Jeanmarie HubertBridget Brittian [id number]'s Date: 01/22/2018 Reason for consult: Initial assessment;Early term 1137-38.6wks Baby is 4814 hours old and mom has been formula feeding.  She states she would like to breastfeed but nipples are flat.  Nipples invert with compression.  Baby unable to grasp tissue so a 20 mm nipple shield applied.  Baby latched well and suckled for 5 minutes.  Baby came off breast.  No colostrum seen in shield.  Symphony pump set up and initiated.  Instructed to put baby to breast with feeding cues using nipple shield, post pump every 3 hours x 15 minutes and offer formula supplementation if still hungry.  Encouraged to call for assist prn.  Maternal Data Has patient been taught Hand Expression?: Yes Does the patient have breastfeeding experience prior to this delivery?: Yes  Feeding Feeding Type: Breast Fed Length of feed: 5 min  LATCH Score Latch: (S) Repeated attempts needed to sustain latch, nipple held in mouth throughout feeding, stimulation needed to elicit sucking reflex.(WITH NIPPLE SHIELD)  Audible Swallowing: None  Type of Nipple: Flat  Comfort (Breast/Nipple): Soft / non-tender  Hold (Positioning): Assistance needed to correctly position infant at breast and maintain latch.  LATCH Score: 5  Interventions Interventions: Breast feeding basics reviewed;Assisted with latch;Breast compression;Skin to skin;Adjust position;Breast massage;Support pillows;Hand express;Position options  Lactation Tools Discussed/Used Tools: Nipple Shields Nipple shield size: 20 Pump Review: Setup, frequency, and cleaning;Milk Storage Initiated by:: LM Date initiated:: 01/22/18   Consult Status Consult Status: Follow-up Date: 01/23/18 Follow-up type: In-patient    Huston FoleyMOULDEN, Agness Sibrian S 01/22/2018, 2:39 PM

## 2018-01-22 NOTE — Anesthesia Postprocedure Evaluation (Signed)
Anesthesia Post Note  Patient: Debra Sullivan  Procedure(s) Performed: AN AD HOC LABOR EPIDURAL     Patient location during evaluation: Mother Baby Anesthesia Type: Epidural Level of consciousness: awake Pain management: pain level controlled Vital Signs Assessment: post-procedure vital signs reviewed and stable Respiratory status: spontaneous breathing Cardiovascular status: stable Postop Assessment: epidural receding Anesthetic complications: no    Last Vitals:  Vitals:   01/22/18 0322 01/22/18 0803  BP: 119/70 (!) 122/59  Pulse: 97 97  Resp: 18 18  Temp: 37.3 C 36.9 C  SpO2:  97%    Last Pain:  Vitals:   01/22/18 0803  TempSrc: Oral  PainSc: 0-No pain   Pain Goal:                 Edison PaceWILKERSON,Natalie Leclaire

## 2018-01-22 NOTE — Progress Notes (Addendum)
Patient Name: Debra Sullivan, female   DOB: 04/30/1986, 32 y.o.  MRN: [id number]  Risks of procedure discussed with patient including but not limited to: risk of regret, permanence of method, bleeding, infection, injury to surrounding organs and need for additional procedures.  Failure risk of 1 -2 % with increased risk of ectopic gestation if pregnancy occurs was also discussed with patient.    Patient denies having previous surgery.  Levie HeritageStinson, Jacob J, DO 01/22/2018 8:42 AM

## 2018-01-23 ENCOUNTER — Encounter (HOSPITAL_COMMUNITY): Payer: Self-pay

## 2018-01-23 ENCOUNTER — Encounter (HOSPITAL_COMMUNITY): Payer: Self-pay | Admitting: Family Medicine

## 2018-01-23 MED ORDER — SENNOSIDES-DOCUSATE SODIUM 8.6-50 MG PO TABS: 2.0000 | ORAL_TABLET | ORAL | 0 refills | Status: DC

## 2018-01-23 MED ORDER — OXYCODONE HCL 5 MG PO TABS: 5.0000 mg | ORAL_TABLET | ORAL | 0 refills | Status: DC | PRN

## 2018-01-23 NOTE — Clinical Social Work Maternal (Signed)
CLINICAL SOCIAL WORK MATERNAL/CHILD NOTE  Patient Details  Name: Debra Sullivan MRN: [id number] Date of Birth: 23-Mar-1986  Date:  01/23/2018  Clinical Social Worker Initiating Note:  Laurey Arrow Date/Time: Initiated:  01/23/18/1247     Child's Name:  Debra Housekeeper   Biological Parents:  Mother, Father   Need for Interpreter:  None   Reason for Referral:  Behavioral Health Concerns, Current Substance Use/Substance Use During Pregnancy    Address:  [address] [address]    Phone number:  [phone] (home)     Additional phone number:   Household Members/Support Persons (HM/SP):   Household Member/Support Person 1(MOB refused to provide any information regarding her older children. )   HM/SP Name Relationship DOB or Age  HM/SP -1 Dexter Haskins FOB 10/27/1983  HM/SP -2        HM/SP -3        HM/SP -4        HM/SP -5        HM/SP -6        HM/SP -7        HM/SP -8          Natural Supports (not living in the home):  Immediate Family, Friends, Extended Family, Parent(Per MOB,l FOB's family will also provide supports. )   Professional Supports: None   Employment: Unemployed   Type of Work:     Education:  [place] arranged:    Museum/gallery curator Resources:      Other Resources:  Physicist, medical , [facility]   Cultural/Religious Considerations Which May Impact Care:  Per MOB's Face Sheet, MOB is Christian  Strengths:  Ability to meet basic needs , Understanding of illness, Home prepared for child    Psychotropic Medications:         Pediatrician:       Pediatrician List:   [place]      Pediatrician Fax Number:    Risk Factors/Current Problems:  Mental Health Concerns , Substance Use    Cognitive State:  Alert , Able to Concentrate , Linear Thinking    Mood/Affect:  Agitated , Apprehensive    CSW Assessment: CSW met  with MOB in room 116.  When CSW arrived, MOB was eating lunch and FOB was bonding with infant on the couch as evidence by engaging in skin to skin. CSW offer to return at a later time and MOB insisted on meeting now.  CSW explained CSW's role and MOB provided verbal permission for CSW to complete the assessment while FOB was present. MOB appeared agitated and apprehensive and decline to answer several of CSW's questions.  CSW asked about MOB currently emotions state and MOB reported feeling good and happy about being a new mother again. CSW asked about MOB's MH hx and MOB communicated, "Why are you so concerned about my mental health. I just told you that I am fine." CSW explained concerns regarding PMAD and attempted to provide MOB with education.  MOB refused all information. CSW assessed for safety and MOB denied HI, SI, and auditory hallucinations.   CSW asked about MOB's SA hx and MOB denied having a hx.  MOB reported, "I have not used any substance in years.  CSW made MOB aware of positive UDS for [facility] in December 2018.  MOB became quiet, and stated "What else  do you need to know." CSW explained hospital substance abuse policy and MOB appeared un bothered. CSW made MOB aware that infant's UDS was negative and that CSW will continue to monitor infant's CDS. CSW communicated to MOB that if infant's CDS is positive without an explanation, CSW will make a report to [facility] CPS.  MOB reported having CPS hx but nothing currently active; CSW verified information with CPS Malachy Chamber).   MOB also reported having all essential items need for parenting and feeling prepared to parent.   CSW Plan/Description:  No Further Intervention Required/No Barriers to Discharge, Sudden Infant Death Syndrome (SIDS) Education, Perinatal Mood and Anxiety Disorder (PMADs) Education, CSW Will Continue to Monitor Umbilical Cord Tissue Drug Screen Results and Make Report if Warranted   Laurey Arrow, MSW,  LCSW Clinical Social Work [phone]   Dimple Nanas, [place] 01/23/2018, 12:59 PM

## 2018-01-23 NOTE — Discharge Instructions (Signed)
Postpartum Care After Vaginal Delivery °The period of time right after you deliver your newborn is called the postpartum period. °What kind of medical care will I receive? °· You may continue to receive fluids and medicines through an IV tube inserted into one of your veins. °· If an incision was made near your vagina (episiotomy) or if you had some vaginal tearing during delivery, cold compresses may be placed on your episiotomy or your tear. This helps to reduce pain and swelling. °· You may be given a squirt bottle to use when you go to the bathroom. You may use this until you are comfortable wiping as usual. To use the squirt bottle, follow these steps: °? Before you urinate, fill the squirt bottle with warm water. Do not use hot water. °? After you urinate, while you are sitting on the toilet, use the squirt bottle to rinse the area around your urethra and vaginal opening. This rinses away any urine and blood. °? You may do this instead of wiping. As you start healing, you may use the squirt bottle before wiping yourself. Make sure to wipe gently. °? Fill the squirt bottle with clean water every time you use the bathroom. °· You will be given sanitary pads to wear. °How can I expect to feel? °· You may not feel the need to urinate for several hours after delivery. °· You will have some soreness and pain in your abdomen and vagina. °· If you are breastfeeding, you may have uterine contractions every time you breastfeed for up to several weeks postpartum. Uterine contractions help your uterus return to its normal size. °· It is normal to have vaginal bleeding (lochia) after delivery. The amount and appearance of lochia is often similar to a menstrual period in the first week after delivery. It will gradually decrease over the next few weeks to a dry, yellow-brown discharge. For most women, lochia stops completely by 6-8 weeks after delivery. Vaginal bleeding can vary from woman to woman. °· Within the first few  days after delivery, you may have breast engorgement. This is when your breasts feel heavy, full, and uncomfortable. Your breasts may also throb and feel hard, tightly stretched, warm, and tender. After this occurs, you may have milk leaking from your breasts. Your health care provider can help you relieve discomfort due to breast engorgement. Breast engorgement should go away within a few days. °· You may feel more sad or worried than normal due to hormonal changes after delivery. These feelings should not last more than a few days. If these feelings do not go away after several days, speak with your health care provider. °How should I care for myself? °· Tell your health care provider if you have pain or discomfort. °· Drink enough water to keep your urine clear or pale yellow. °· Wash your hands thoroughly with soap and water for at least 20 seconds after changing your sanitary pads, after using the toilet, and before holding or feeding your baby. °· If you are not breastfeeding, avoid touching your breasts a lot. Doing this can make your breasts produce more milk. °· If you become weak or lightheaded, or you feel like you might faint, ask for help before: °? Getting out of bed. °? Showering. °· Change your sanitary pads frequently. Watch for any changes in your flow, such as a sudden increase in volume, a change in color, the passing of large blood clots. If you pass a blood clot from your vagina, save it   to show to your health care provider. Do not flush blood clots down the toilet without having your health care provider look at them. °· Make sure that all your vaccinations are up to date. This can help protect you and your baby from getting certain diseases. You may need to have immunizations done before you leave the hospital. °· If desired, talk with your health care provider about methods of family planning or birth control (contraception). °How can I start bonding with my baby? °Spending as much time as  possible with your baby is very important. During this time, you and your baby can get to know each other and develop a bond. Having your baby stay with you in your room (rooming in) can give you time to get to know your baby. Rooming in can also help you become comfortable caring for your baby. Breastfeeding can also help you bond with your baby. °How can I plan for returning home with my baby? °· Make sure that you have a car seat installed in your vehicle. °? Your car seat should be checked by a certified car seat installer to make sure that it is installed safely. °? Make sure that your baby fits into the car seat safely. °· Ask your health care provider any questions you have about caring for yourself or your baby. Make sure that you are able to contact your health care provider with any questions after leaving the hospital. °This information is not intended to replace advice given to you by your health care provider. Make sure you discuss any questions you have with your health care provider. °Document Released: 04/02/2007 Document Revised: 11/08/2015 Document Reviewed: 05/10/2015 °Elsevier Interactive Patient Education © 2018 Elsevier Inc. ° °

## 2018-01-23 NOTE — Lactation Note (Signed)
This note was copied from a baby's chart. .Lactation Consultation Note  Patient Name: Debra Jeanmarie HubertBridget Geeslin Today's Date: 01/23/2018  Mom declines having any questions or concerns. Infant noted to be sucking on pacifier.  Lurline HareRichey, Karriem Muench [facility] 01/23/2018, 1:48 PM

## 2018-01-23 NOTE — Discharge Summary (Signed)
OB Discharge Summary     Patient Name: Debra MaineBridget A [place] DOB: 04/30/1986 MRN: [id number]  Date of admission: 01/21/2018 Delivering MD: Sharen CounterLEFTWICH-KIRBY, [name] A   Date of discharge: 01/23/2018  Admitting diagnosis: RCS UNDESIRED FERTILITY Intrauterine pregnancy: 6377w4d     Secondary diagnosis:  Principal Problem:   History of cesarean section complicating pregnancy Active Problems:   Unwanted fertility   Supervision of high-risk pregnancy   VBAC, delivered, current hospitalization  Additional problems:  -Cocaine use, negative tox screen at 30w and neg UDS on admission  -PTSD      Discharge diagnosis: VBAC                                                                                                Post partum procedures:postpartum tubal ligation  Augmentation: None  Complications: None  Hospital course:  Onset of Labor With Vaginal Delivery     32 y.o. yo [id number] at 2177w4d was admitted in Active Labor on 01/21/2018. Patient had an uncomplicated labor course as follows:  Membrane Rupture Time/Date: 6:30 PM ,01/21/2018   Intrapartum Procedures: Episiotomy: None [1]                                         Lacerations:  None [1]  Patient had a delivery of a Viable infant. 01/22/2018  Information for the patient's newborn:  Renea EeGriffin, Girl Larkyn [[id number]][[id number]]  Delivery Method: VBAC, Spontaneous(Filed from Delivery Summary)    Pateint had an uncomplicated postpartum course.  She is ambulating, tolerating a regular diet, passing flatus, and urinating well. Patient is discharged home in stable condition on 01/23/18.   Physical exam  Vitals:   01/22/18 2033 01/23/18 0033 01/23/18 0446 01/23/18 1500  BP: 122/69 123/75 115/75 114/69  Pulse: 70 67 77 89  Resp: 12 16 18 20   Temp: 97.7 F (36.5 C) 98.5 F (36.9 C) 97.8 F (36.6 C) 98.2 F (36.8 C)  TempSrc:   Oral Oral  SpO2: 99% 99% 99% 99%   General: alert and cooperative Lochia: appropriate Uterine Fundus: firm Incision:  Healing well with no significant drainage DVT Evaluation: No evidence of DVT seen on physical exam. Labs: Lab Results  Component Value Date   WBC 10.5 01/21/2018   HGB 10.6 (L) 01/21/2018   HCT 33.0 (L) 01/21/2018   MCV 76.7 (L) 01/21/2018   PLT 282 01/21/2018   CMP Latest Ref Rng & Units 09/03/2015  Glucose 65 - 99 mg/dL 83  BUN 6 - 20 mg/dL 11  Creatinine 5.630.44 - 8.751.00 mg/dL 6.430.72  Sodium 329135 - 518145 mmol/L 135  Potassium 3.5 - 5.1 mmol/L 3.3(L)  Chloride 101 - 111 mmol/L 105  CO2 22 - 32 mmol/L 25  Calcium 8.9 - 10.3 mg/dL 8.4(Z8.8(L)  Total Protein 6.5 - 8.1 g/dL 7.9  Total Bilirubin 0.3 - 1.2 mg/dL 0.7  Alkaline Phos 38 - 126 U/L 40  AST 15 - 41 U/L 17  ALT 14 - 54 U/L 13(L)    Discharge instruction: per  After Visit Summary and "Baby and Me Booklet".  After visit meds:  Allergies as of 01/23/2018      Reactions   Naproxen Anaphylaxis      Medication List    TAKE these medications   oxyCODONE 5 MG immediate release tablet Commonly known as:  Oxy IR/ROXICODONE Take 1 tablet (5 mg total) by mouth every 4 (four) hours as needed for severe pain.   PREPLUS 27-1 MG Tabs Take 1 tablet by mouth daily.   senna-docusate 8.6-50 MG tablet Commonly known as:  Senokot-S Take 2 tablets by mouth daily. Start taking on:  01/24/2018       Diet: routine diet  Activity: Advance as tolerated. Pelvic rest for 6 weeks.   Outpatient follow up:4 weeks Follow up Appt: Future Appointments  Date Time Provider Department Center  02/22/2018  8:30 AM Calvert Cantor, CNM [facility] None   Follow up Visit:No follow-ups on file.  Postpartum contraception: Tubal Ligation  Newborn Data: Live born female  Birth Weight: 7 lb 10.4 oz (3470 g) APGAR: 7, 9  Newborn Delivery   Birth date/time:  01/22/2018 00:37:00 Delivery type:  VBAC, Spontaneous     Baby Feeding: Bottle, Breast  Disposition:home with mother   01/23/2018 De Hollingshead, DO

## 2018-01-23 NOTE — Anesthesia Postprocedure Evaluation (Signed)
Anesthesia Post Note  Patient: Lucila MaineBridget A Charlet  Procedure(s) Performed: POST PARTUM TUBAL LIGATION (Bilateral )     Patient location during evaluation: PACU Anesthesia Type: General Level of consciousness: awake and alert Pain management: pain level controlled Vital Signs Assessment: post-procedure vital signs reviewed and stable Respiratory status: spontaneous breathing, nonlabored ventilation, respiratory function stable and patient connected to nasal cannula oxygen Cardiovascular status: blood pressure returned to baseline and stable Postop Assessment: no apparent nausea or vomiting Anesthetic complications: no    Last Vitals:  Vitals:   01/23/18 0033 01/23/18 0446  BP: 123/75 115/75  Pulse: 67 77  Resp: 16 18  Temp: 36.9 C 36.6 C  SpO2: 99% 99%    Last Pain:  Vitals:   01/23/18 0715  TempSrc:   PainSc: 8                  Trevor IhaStephen A Anella Nakata

## 2018-01-24 ENCOUNTER — Encounter (HOSPITAL_COMMUNITY)
Admission: RE | Admit: 2018-01-24 | Discharge: 2018-01-24 | Disposition: A | Discharge status: Home / Self Care | Payer: Medicaid Other | Source: Ambulatory Visit | Attending: Family Medicine | Admitting: Family Medicine

## 2018-01-25 NOTE — Progress Notes (Signed)
Received message from pathologist - no fallopian tube identified on right. Upon review of chart, pt had right salpingectomy for ectopic pregnancy in 03/2017, which was previously unknown to me.

## 2018-02-22 ENCOUNTER — Ambulatory Visit: Payer: Self-pay | Admitting: Advanced Practice Midwife

## 2018-06-13 ENCOUNTER — Emergency Department (HOSPITAL_COMMUNITY)
Admission: EM | Admit: 2018-06-13 | Discharge: 2018-06-14 | Disposition: A | Discharge status: Other Acute Inpatient Hospital | Payer: Medicaid Other | Attending: Emergency Medicine | Admitting: Emergency Medicine

## 2018-06-13 ENCOUNTER — Other Ambulatory Visit: Payer: Self-pay

## 2018-06-13 ENCOUNTER — Encounter (HOSPITAL_COMMUNITY): Payer: Self-pay | Admitting: Emergency Medicine

## 2018-06-13 DIAGNOSIS — Z9114 Patient's other noncompliance with medication regimen: Secondary | ICD-10-CM | POA: Insufficient documentation

## 2018-06-13 DIAGNOSIS — R110 Nausea: Secondary | ICD-10-CM | POA: Insufficient documentation

## 2018-06-13 DIAGNOSIS — E876 Hypokalemia: Secondary | ICD-10-CM | POA: Insufficient documentation

## 2018-06-13 DIAGNOSIS — F3113 Bipolar disorder, current episode manic without psychotic features, severe: Secondary | ICD-10-CM | POA: Insufficient documentation

## 2018-06-13 DIAGNOSIS — Z79899 Other long term (current) drug therapy: Secondary | ICD-10-CM | POA: Insufficient documentation

## 2018-06-13 DIAGNOSIS — F309 Manic episode, unspecified: Secondary | ICD-10-CM

## 2018-06-13 DIAGNOSIS — Z87891 Personal history of nicotine dependence: Secondary | ICD-10-CM | POA: Insufficient documentation

## 2018-06-13 DIAGNOSIS — Z046 Encounter for general psychiatric examination, requested by authority: Secondary | ICD-10-CM | POA: Insufficient documentation

## 2018-06-13 DIAGNOSIS — G479 Sleep disorder, unspecified: Secondary | ICD-10-CM | POA: Insufficient documentation

## 2018-06-13 DIAGNOSIS — R630 Anorexia: Secondary | ICD-10-CM | POA: Insufficient documentation

## 2018-06-13 DIAGNOSIS — R42 Dizziness and giddiness: Secondary | ICD-10-CM | POA: Insufficient documentation

## 2018-06-13 LAB — COMPREHENSIVE METABOLIC PANEL WITH GFR
ALT: 19 U/L (ref 0–44)
AST: 18 U/L (ref 15–41)
Albumin: 4 g/dL (ref 3.5–5.0)
Alkaline Phosphatase: 45 U/L (ref 38–126)
Anion gap: 4 — ABNORMAL LOW (ref 5–15)
BUN: 6 mg/dL (ref 6–20)
CO2: 30 mmol/L (ref 22–32)
Calcium: 9.4 mg/dL (ref 8.9–10.3)
Chloride: 109 mmol/L (ref 98–111)
Creatinine, Ser: 0.99 mg/dL (ref 0.44–1.00)
GFR calc Af Amer: >60 mL/min
GFR calc non Af Amer: >60 mL/min
Glucose, Bld: 89 mg/dL (ref 70–99)
Potassium: 2.9 mmol/L — ABNORMAL LOW (ref 3.5–5.1)
Sodium: 143 mmol/L (ref 135–145)
Total Bilirubin: 1.3 mg/dL — ABNORMAL HIGH (ref 0.3–1.2)
Total Protein: 7.1 g/dL (ref 6.5–8.1)

## 2018-06-13 LAB — CBC
HEMATOCRIT: 37.2 % (ref 36.0–46.0)
Hemoglobin: 11.7 g/dL — ABNORMAL LOW (ref 12.0–15.0)
MCH: 23.4 pg — AB (ref 26.0–34.0)
MCHC: 31.5 g/dL (ref 30.0–36.0)
MCV: 74.4 fL — AB (ref 80.0–100.0)
PLATELETS: 311 10*3/uL (ref 150–400)
RBC: 5 MIL/uL (ref 3.87–5.11)
RDW: 14.8 % (ref 11.5–15.5)
WBC: 6.9 10*3/uL (ref 4.0–10.5)
nRBC: 0 % (ref 0.0–0.2)

## 2018-06-13 LAB — I-STAT BETA HCG BLOOD, ED (MC, WL, AP ONLY): I-stat hCG, quantitative: <5 m[IU]/mL

## 2018-06-13 LAB — ETHANOL: Alcohol, Ethyl (B): <10 mg/dL

## 2018-06-13 MED ORDER — ACETAMINOPHEN 325 MG PO TABS: 650.0000 mg | ORAL_TABLET | ORAL | Status: DC | PRN

## 2018-06-13 MED ORDER — ALUM & MAG HYDROXIDE-SIMETH 200-200-20 MG/5ML PO SUSP: 30.0000 mL | Freq: Four times a day (QID) | ORAL | Status: DC | PRN

## 2018-06-13 MED ORDER — POTASSIUM CHLORIDE CRYS ER 20 MEQ PO TBCR
40.0000 meq | EXTENDED_RELEASE_TABLET | Freq: Once | ORAL | Status: AC
  Administered 2018-06-14: 40 meq via ORAL

## 2018-06-13 MED ORDER — ONDANSETRON HCL 4 MG PO TABS: 4.0000 mg | ORAL_TABLET | Freq: Three times a day (TID) | ORAL | Status: DC | PRN

## 2018-06-13 NOTE — ED Notes (Signed)
Pt attempts to use bathroom. Cannot produce urine.

## 2018-06-13 NOTE — BH Assessment (Addendum)
Tele Assessment Note   Patient Name: Debra Sullivan MRN: [id number] Referring Physician: Madilyn Hookees Location of Patient: [facility] ED Location of Provider: Behavioral Health TTS Department  Debra Sullivan is an 32 y.o. female.  The pt came in after displaying bizarre behaviors.Per Boyfriend:  Patient was found on Christmas Eve, walking down a road by someone, naked with a baby blanket around her.  That same day she had her boyfriend's hair clippers and was shaving her hair.  Today, he came home, found the baby stroller in the parking lot of the apartment complex, parked in a parking space.  His grill was outside of the apartment, where it was on the balcony when he left the house.  The bed was moved to the livingroom, the livingroom furniture was on the balcony.  The pt's boyfriend stated this behavior started about a week ago.  He reported the pt is taking naps at night and will leave the apartment at night.  The pt stated she was cleaning when she moved things around in the apartment.  She reported she is currently not taking any medication.  She is going to [facility], but it appears she isn't seeing a psychiatrist.  The pt has had a history of bizarre behavior in the past.  She was last hospitalized in 2016.  The pt is living with her boyfriend Debra Sullivan and her 764 month old child.  The pt denies SI, self harm, HI, legal issues, history of abuse and hallucinations.  The pt stated she is sleeping about 3 hours a night.  The pt's boyfriend thinks she is sleeping less than that.  According to the pt's boyfriend, the lack of sleep started about a week ago.  The pt stated she has a god appetite.  The pt denies SA.  Pt is dressed in scrubs. She is alert and oriented x4. Pt speaks in a clear tone, at moderate volume and normal pace. Eye contact is fair. Pt's mood is guarded.  When the pt was asked her name and birthday, the pt shook her head no.  The pt was identified by her wrist band. Thought  process is coherent and relevant. There is indication Pt is currently responding to internal stimuli or experiencing delusional thought content.  The pt would often stare off into space.?Pt was cooperative throughout assessment.    Diagnosis: F31.13 Bipolar I disorder, Current or most recent episode manic, Severe   Past Medical History:  Past Medical History:  Diagnosis Date  . Anemia   . Drug abuse (HCC)   . Psychosis (HCC) 2013    Past Surgical History:  Procedure Laterality Date  . CESAREAN SECTION  03/2016   [facility]  . LAPAROSCOPY N/A 04/14/2017   Procedure: LAPAROSCOPY OPERATIVE WITH RIGHT SALPINGECTOMY;  Surgeon: Conan Bowensavis, Kelly M, MD;  Location: [facility] ORS;  Service: Gynecology;  Laterality: N/A;  . TUBAL LIGATION Bilateral 01/22/2018   Procedure: POST PARTUM TUBAL LIGATION;  Surgeon: Levie HeritageStinson, Jacob J, DO;  Location: [facility];  Service: Gynecology;  Laterality: Bilateral;    Family History:  Family History  Problem Relation Age of Onset  . Diabetes Mother   . Schizophrenia Mother   . Diabetes Brother   . Hypertension Maternal Aunt     Social History:  reports that she quit smoking about 8 months ago. Her smoking use included cigarettes. She smoked 0.25 packs per day. She has never used smokeless tobacco. She reports that she does not drink  alcohol or use drugs.  Additional Social History:  Alcohol / Drug Use Pain Medications: See MAR Prescriptions: See MAR Over the Counter: See MAR History of alcohol / drug use?: No history of alcohol / drug abuse Longest period of sobriety (when/how long): NA  CIWA: CIWA-Ar BP: 122/79 Pulse Rate: 88 Nausea and Vomiting: mild nausea with no vomiting Tactile Disturbances: none Tremor: no tremor Auditory Disturbances: not present Paroxysmal Sweats: no sweat visible Visual Disturbances: not present Anxiety: two Headache, Fullness in Head: none present Agitation: normal activity Orientation and  Clouding of Sensorium: oriented and can do serial additions CIWA-Ar Total: 3 COWS: Clinical Opiate Withdrawal Scale (COWS) Resting Pulse Rate: Pulse Rate 81-100 Sweating: Subjective report of chills or flushing Restlessness: Able to sit still Pupil Size: Pupils pinned or normal size for room light Bone or Joint Aches: Not present Runny Nose or Tearing: Not present GI Upset: nausea or loose stool Tremor: No tremor Yawning: No yawning Anxiety or Irritability: Patient obviously irritable/anxious Gooseflesh Skin: Skin is smooth COWS Total Score: 6  Allergies:  Allergies  Allergen Reactions  . Naproxen Anaphylaxis    Home Medications: (Not in a hospital admission)   OB/GYN Status:  No LMP recorded.  General Assessment Data Location of Assessment: [facility] ED TTS Assessment: In system Is this a Tele or Face-to-Face Assessment?: Face-to-Face Is this an Initial Assessment or a Re-assessment for this encounter?: Initial Assessment Patient Accompanied by:: N/A Language Other than English: No Living Arrangements: Other (Comment)(home) What gender do you identify as?: Female Marital status: Single Maiden name: Koran Pregnancy Status: No Living Arrangements: Spouse/significant other Can pt return to current living arrangement?: Yes Admission Status: Involuntary Is patient capable of signing voluntary admission?: Yes Referral Source: Self/Family/Friend Insurance type: Medicaid     Crisis Care Plan Living Arrangements: Spouse/significant other Legal Guardian: Other:(Self) Name of Psychiatrist: None Name of Therapist: [facility]  Education Status Is patient currently in school?: No Is the patient employed, unemployed or receiving disability?: Unemployed  Risk to self with the past 6 months Suicidal Ideation: No Has patient been a risk to self within the past 6 months prior to admission? : No Suicidal Intent: No Has patient had any suicidal intent within the past 6 months prior  to admission? : No Is patient at risk for suicide?: No Suicidal Plan?: No Has patient had any suicidal plan within the past 6 months prior to admission? : No Access to Means: No What has been your use of drugs/alcohol within the last 12 months?: none Previous Attempts/Gestures: No How many times?: 0 Other Self Harm Risks: none Triggers for Past Attempts: None known Intentional Self Injurious Behavior: None Family Suicide History: No Recent stressful life event(s): Other (Comment)(denies) Persecutory voices/beliefs?: No Depression: No Depression Symptoms: Insomnia Substance abuse history and/or treatment for substance abuse?: No Suicide prevention information given to non-admitted patients: Not applicable  Risk to Others within the past 6 months Homicidal Ideation: No Does patient have any lifetime risk of violence toward others beyond the six months prior to admission? : No Thoughts of Harm to Others: No Current Homicidal Intent: No Current Homicidal Plan: No Access to Homicidal Means: No Identified Victim: none History of harm to others?: No Assessment of Violence: None Noted Violent Behavior Description: none Does patient have access to weapons?: No Criminal Charges Pending?: No Does patient have a court date: No Is patient on probation?: No  Psychosis Hallucinations: None noted Delusions: Unspecified  Mental Status Report Appearance/Hygiene: In scrubs, Unremarkable Eye Contact: Good  Motor Activity: Freedom of movement, Unremarkable Speech: Logical/coherent Level of Consciousness: Drowsy, Other (Comment)(guarded) Mood: Preoccupied, Other (Comment)(guarded) Affect: Apprehensive Anxiety Level: None Thought Processes: Coherent, Relevant Judgement: Impaired Orientation: Person, Place, Time, Situation Obsessive Compulsive Thoughts/Behaviors: None  Cognitive Functioning Concentration: Normal Memory: Recent Intact, Remote Intact Is patient IDD: No Insight:  Poor Impulse Control: Poor Appetite: Good Have you had any weight changes? : No Change Sleep: Decreased Total Hours of Sleep: 3 Vegetative Symptoms: None  ADLScreening [facility] Assessment Services) Patient's cognitive ability adequate to safely complete daily activities?: Yes Patient able to express need for assistance with ADLs?: Yes Independently performs ADLs?: Yes (appropriate for developmental age)  Prior Inpatient Therapy Prior Inpatient Therapy: Yes Prior Therapy Dates: 03/2015 Prior [facility] Facilty[facility]Provider[facility] Reason for Treatment: bizarre behavior  Prior Outpatient Therapy Prior Outpatient Therapy: Yes Prior Therapy Dates: 04/2018 Prior Therapy Facilty/Provider(s): [facility] Reason for Treatment: bipolar disorder Does patient have an ACCT team?: No Does patient have Intensive In-House Services?  : No Does patient have [facility] services? : No Does patient have P4CC services?: No  ADL Screening (condition at time of admission) Patient's cognitive ability adequate to safely complete daily activities?: Yes Patient able to express need for assistance with ADLs?: Yes Independently performs ADLs?: Yes (appropriate for developmental age)       Abuse/Neglect Assessment (Assessment to be complete while patient is alone) Abuse/Neglect Assessment Can Be Completed: Yes Physical Abuse: Denies Verbal Abuse: Denies Sexual Abuse: Denies Exploitation of patient/patient's resources: Denies Self-Neglect: Denies Values / Beliefs Cultural Requests During Hospitalization: None Spiritual Requests During Hospitalization: None Consults Spiritual Care Consult Needed: No Social Work Consult Needed: No            Disposition:  Disposition Initial Assessment Completed for this Encounter: Yes  PA Donell SievertSpencer Simon recommends inpatient treatment.  MD Madilyn HookRees was made aware of the recommendation.  This service was provided via telemedicine using a 2-way, interactive audio and video  technology.  Names of all persons participating in this telemedicine service and their role in this encounter. Name: Jeanmarie HubertBridget Sullivan Role: Pt  Name: Debra Sullivan Role: Pt's boyfriend  Name: Riley ChurchesKendall Ladarrian Sullivan Role: TTS  Name:  Role:     Ottis StainGarvin, Khaden Gater Jermaine 06/13/2018 10:50 PM

## 2018-06-13 NOTE — ED Provider Notes (Signed)
[facility] EMERGENCY DEPARTMENT Provider Note   CSN: [id number] Arrival date & time: 06/13/18  1904     History   Chief Complaint Chief Complaint  Patient presents with  . Dizziness  . Nausea    HPI Debra Sullivan is a 32 y.o. female.  The history is provided by the patient, medical records and a significant other. No language interpreter was used.  Dizziness   Debra Sullivan is a 32 y.o. female who presents to the Emergency Department complaining of bizzare behavior.  Level V caveat due to psychiatric illness. She is accompanied by her boyfriend, who states that she has been acting strangely for the last several days with running naked down the street, leaving her 274 month old alone, rearranging furniture and cutting off her hair.  She has a hx/o schizophrenia and bipolar d/o and has not been taking her meds.  She does not know what he is talking about but does report poor appetite, poor sleep, dizziness and nausea. He does report a history of depression and anxiety but is not taking her medications. Past Medical History:  Diagnosis Date  . Anemia   . Drug abuse (HCC)   . Psychosis (HCC) 2013    Patient Active Problem List   Diagnosis Date Noted  . VBAC, delivered, current hospitalization 01/22/2018  . Supervision of high-risk pregnancy 01/21/2018  . LGA (large for gestational age) fetus affecting management of mother, third trimester, fetus 1 01/08/2018  . Anemia during pregnancy in third trimester 11/28/2017  . Drug dependence affecting pregnancy in third trimester   . Suspected fetal anomaly, antepartum   . Trichomonosis 08/01/2017  . Not immune to rubella 08/01/2017  . Supervision of other normal pregnancy, antepartum 07/11/2017  . History of cesarean section complicating pregnancy 07/11/2017  . Unwanted fertility 07/11/2017  . 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 Cannabis use disorder,  severe, dependence (HCC) 04/15/2015  . PTSD (post-traumatic stress disorder) 04/15/2015  . Cocaine use disorder, moderate, in early remission (HCC) 04/15/2015  . Psychosis (HCC) 04/14/2015    Past Surgical History:  Procedure Laterality Date  . CESAREAN SECTION  03/2016   [facility]  . LAPAROSCOPY N/A 04/14/2017   Procedure: LAPAROSCOPY OPERATIVE WITH RIGHT SALPINGECTOMY;  Surgeon: Conan Bowensavis, Kelly M, MD;  Location: [facility]S;  Service: Gynecology;  Laterality: N/A;  . TUBAL LIGATION Bilateral 01/22/2018   Procedure: POST PARTUM TUBAL LIGATION;  Surgeon: Levie HeritageStinson, Jacob J, DO;  Location: [facility];  Service: Gynecology;  Laterality: Bilateral;     OB History    Gravida  6   Para  5   Term  4   Preterm  1   AB  1   Living  6     SAB  0   TAB      Ectopic  1   Multiple  1   Live Births  6            Home Medications    Prior to Admission medications   Medication Sig Start Date End Date Taking? Authorizing Provider  oxyCODONE (OXY IR/ROXICODONE) 5 MG immediate release tablet Take 1 tablet (5 mg total) by mouth every 4 (four) hours as needed for severe pain. 01/23/18   Arvilla MarketWallace, Catherine Lauren, DO  Prenatal Vit-Fe Fumarate-FA (PREPLUS) 27-1 MG TABS Take 1 tablet by mouth daily. 07/11/17   Hermina StaggersErvin, Michael L, MD  senna-docusate (SENOKOT-S) 8.6-50 MG  tablet Take 2 tablets by mouth daily. 01/24/18   Arvilla MarketWallace, Catherine Lauren, DO    Family History Family History  Problem Relation Age of Onset  . Diabetes Mother   . Schizophrenia Mother   . Diabetes Brother   . Hypertension Maternal Aunt     Social History Social History   Tobacco Use  . Smoking status: Former Smoker    Packs/day: 0.25    Types: Cigarettes    Last attempt to quit: 10/03/2017    Years since quitting: 0.6  . Smokeless tobacco: Never Used  Substance Use Topics  . Alcohol use: No    Alcohol/week: 0.0 standard drinks  . Drug use: No    Types: Marijuana, Cocaine    Comment:  Cocaine & Marijuana was used10/26/2018     Allergies   Naproxen   Review of Systems Review of Systems  Neurological: Positive for dizziness.  All other systems reviewed and are negative.    Physical Exam Updated Vital Signs BP 122/79 (BP Location: Right Arm)   Pulse 88   Temp 98.8 F (37.1 C) (Oral)   Resp 16   SpO2 100%   Physical Exam Vitals signs and nursing note reviewed.  Constitutional:      Appearance: She is well-developed.  HENT:     Head: Normocephalic and atraumatic.  Cardiovascular:     Rate and Rhythm: Normal rate and regular rhythm.     Heart sounds: No murmur.  Pulmonary:     Effort: Pulmonary effort is normal. No respiratory distress.     Breath sounds: Normal breath sounds.  Abdominal:     Palpations: Abdomen is soft.     Tenderness: There is no abdominal tenderness. There is no guarding or rebound.  Musculoskeletal:        General: No swelling or tenderness.  Skin:    General: Skin is warm and dry.  Neurological:     Mental Status: She is alert and oriented to person, place, and time.     Comments: MAE symmetrically  Psychiatric:     Comments: Flat affect      ED Treatments / Results  Labs (all labs ordered are listed, but only abnormal results are displayed) Labs Reviewed  COMPREHENSIVE METABOLIC PANEL - Abnormal; Notable for the following components:      Result Value   Potassium 2.9 (*)    Total Bilirubin 1.3 (*)    Anion gap 4 (*)    All other components within normal limits  CBC - Abnormal; Notable for the following components:   Hemoglobin 11.7 (*)    MCV 74.4 (*)    MCH 23.4 (*)    All other components within normal limits  ETHANOL  RAPID URINE DRUG SCREEN, HOSP PERFORMED  URINALYSIS, ROUTINE W REFLEX MICROSCOPIC  I-STAT BETA HCG BLOOD, ED ([facility], [facility], [facility] ONLY)    EKG None  Radiology No results found.  Procedures Procedures (including critical care time)  Medications Ordered in ED Medications  acetaminophen  (TYLENOL) tablet 650 mg (has no administration in time range)  ondansetron (ZOFRAN) tablet 4 mg (has no administration in time range)  alum & mag hydroxide-simeth (MAALOX/MYLANTA) 200-200-20 MG/5ML suspension 30 mL (has no administration in time range)  potassium chloride SA (K-DUR,KLOR-CON) CR tablet 40 mEq (has no administration in time range)     Initial Impression / Assessment and Plan / ED Course  I have reviewed the triage vital signs and the nursing notes.  Pertinent labs & imaging results that were  available during my care of the patient were reviewed by me and considered in my medical decision making (see chart for details).     Patient brought in by her significant other for bizarre behavior and being found running naked in the street. Patient reports feeling unwell with poor oral intake and poor sleep, she does not recall episode of running in the street. Due to concern for flight risk IVC was completed. Labs demonstrate mild hypokalemia, will replace orally. Patient has been medically cleared for psychiatric evaluation and treatment.  Final Clinical Impressions(s) / ED Diagnoses   Final diagnoses:  None    ED Discharge Orders    None       Tilden Fossa, MD 06/13/18 2341

## 2018-06-13 NOTE — ED Notes (Addendum)
Belongings inventoried. Placed in purple locker 2. Valuables given to security. Pt given lunch bag and drink.

## 2018-06-13 NOTE — ED Notes (Addendum)
Per Boyfriend:  Patient was found on Christmas Eve, walking down a road by someone, naked with a baby blanket around her.  That same day she had her boyfriend's hair clippers and was shaving her hair.  Today, he came home, found the baby stroller in the parking lot of the apartment complex, parked in a parking space.  His grill was outside of the apartment, where it was on the balcony when he left the house.  The bed was moved to the livingroom, the livingroom furniture was on the balcony.  Patient was evaluated by mobile at the home in which they told the boyfriend she needs to be IVC'd and they were working on it.  The mobile crisis worker never came back to the home or call the boyfriend back so he brought the patient here.

## 2018-06-13 NOTE — ED Triage Notes (Signed)
Patient here stating she is dizzy and nauseated and it has been going on for 3-4 days.  Patient is here with boyfriend, who states he left the house to go to the grocery store, came home and found patient running down [address] naked and left the 494 month old baby at home by itself.  Patient does have a schizophrenia history.  Patient denies any SI/HI at this time.

## 2018-06-13 NOTE — ED Notes (Signed)
Pt given sandwich and drink.

## 2018-06-13 NOTE — ED Notes (Signed)
EKG given to Dr. Rees 

## 2018-06-13 NOTE — ED Notes (Signed)
Pt reports that she doesn't remember episodes that boyfriend described in triage.

## 2018-06-13 NOTE — ED Notes (Signed)
Dexter Jeannine KittenHaskins [phone] cell phone, boyfriend

## 2018-06-14 ENCOUNTER — Inpatient Hospital Stay (HOSPITAL_COMMUNITY)
Admission: AD | Admit: 2018-06-14 | Discharge: 2018-06-17 | DRG: 885 | Disposition: A | Discharge status: Home / Self Care | Payer: Medicaid Other | Attending: Psychiatry | Admitting: Psychiatry

## 2018-06-14 ENCOUNTER — Encounter (HOSPITAL_COMMUNITY): Payer: Self-pay

## 2018-06-14 ENCOUNTER — Other Ambulatory Visit: Payer: Self-pay

## 2018-06-14 DIAGNOSIS — F17210 Nicotine dependence, cigarettes, uncomplicated: Secondary | ICD-10-CM | POA: Diagnosis present

## 2018-06-14 DIAGNOSIS — Z8249 Family history of ischemic heart disease and other diseases of the circulatory system: Secondary | ICD-10-CM

## 2018-06-14 DIAGNOSIS — Z91410 Personal history of adult physical and sexual abuse: Secondary | ICD-10-CM

## 2018-06-14 DIAGNOSIS — Z833 Family history of diabetes mellitus: Secondary | ICD-10-CM | POA: Diagnosis not present

## 2018-06-14 DIAGNOSIS — F4310 Post-traumatic stress disorder, unspecified: Secondary | ICD-10-CM | POA: Diagnosis present

## 2018-06-14 DIAGNOSIS — Z818 Family history of other mental and behavioral disorders: Secondary | ICD-10-CM | POA: Diagnosis not present

## 2018-06-14 DIAGNOSIS — Z886 Allergy status to analgesic agent status: Secondary | ICD-10-CM

## 2018-06-14 DIAGNOSIS — F3110 Bipolar disorder, current episode manic without psychotic features, unspecified: Secondary | ICD-10-CM | POA: Diagnosis present

## 2018-06-14 DIAGNOSIS — F1290 Cannabis use, unspecified, uncomplicated: Secondary | ICD-10-CM | POA: Diagnosis present

## 2018-06-14 DIAGNOSIS — F319 Bipolar disorder, unspecified: Secondary | ICD-10-CM | POA: Diagnosis present

## 2018-06-14 DIAGNOSIS — F419 Anxiety disorder, unspecified: Secondary | ICD-10-CM | POA: Diagnosis not present

## 2018-06-14 LAB — URINALYSIS, ROUTINE W REFLEX MICROSCOPIC
GLUCOSE, UA: NEGATIVE mg/dL
Ketones, ur: 5 mg/dL — AB
NITRITE: NEGATIVE
Protein, ur: 30 mg/dL — AB
Specific Gravity, Urine: 1.039 — ABNORMAL HIGH (ref 1.005–1.030)
pH: 5 (ref 5.0–8.0)

## 2018-06-14 LAB — RAPID URINE DRUG SCREEN, HOSP PERFORMED
Amphetamines: NOT DETECTED
Barbiturates: NOT DETECTED
Benzodiazepines: NOT DETECTED
COCAINE: NOT DETECTED
OPIATES: NOT DETECTED
Tetrahydrocannabinol: POSITIVE — AB

## 2018-06-14 MED ORDER — DIPHENHYDRAMINE HCL 25 MG PO CAPS: 50.0000 mg | ORAL_CAPSULE | Freq: Four times a day (QID) | ORAL | Status: DC | PRN

## 2018-06-14 MED ORDER — DIPHENHYDRAMINE HCL 50 MG/ML IJ SOLN: 50.0000 mg | Freq: Four times a day (QID) | INTRAMUSCULAR | Status: DC | PRN

## 2018-06-14 MED ORDER — LORAZEPAM 2 MG/ML IJ SOLN: 1.0000 mg | Freq: Four times a day (QID) | INTRAMUSCULAR | Status: DC | PRN

## 2018-06-14 MED ORDER — MAGNESIUM HYDROXIDE 400 MG/5ML PO SUSP: 30.0000 mL | Freq: Every day | ORAL | Status: DC | PRN

## 2018-06-14 MED ORDER — ACETAMINOPHEN 325 MG PO TABS: 650.0000 mg | ORAL_TABLET | Freq: Four times a day (QID) | ORAL | Status: DC | PRN

## 2018-06-14 MED ORDER — POTASSIUM CHLORIDE CRYS ER 20 MEQ PO TBCR
40.0000 meq | EXTENDED_RELEASE_TABLET | Freq: Once | ORAL | Status: AC
  Administered 2018-06-14: 40 meq via ORAL
  Filled 2018-06-14: qty 2

## 2018-06-14 MED ORDER — HALOPERIDOL 5 MG PO TABS: 5.0000 mg | ORAL_TABLET | Freq: Four times a day (QID) | ORAL | Status: DC | PRN

## 2018-06-14 MED ORDER — TRAZODONE HCL 50 MG PO TABS
50.0000 mg | ORAL_TABLET | Freq: Every evening | ORAL | Status: DC | PRN
  Administered 2018-06-15 – 2018-06-16 (×2): 50 mg via ORAL
  Filled 2018-06-14 (×2): qty 1

## 2018-06-14 MED ORDER — LORAZEPAM 1 MG PO TABS
1.0000 mg | ORAL_TABLET | Freq: Four times a day (QID) | ORAL | Status: DC | PRN
  Administered 2018-06-14: 1 mg via ORAL
  Filled 2018-06-14: qty 1

## 2018-06-14 MED ORDER — HALOPERIDOL LACTATE 5 MG/ML IJ SOLN: 5.0000 mg | Freq: Four times a day (QID) | INTRAMUSCULAR | Status: DC | PRN

## 2018-06-14 MED ORDER — HYDROXYZINE HCL 25 MG PO TABS
25.0000 mg | ORAL_TABLET | Freq: Three times a day (TID) | ORAL | Status: DC | PRN
  Administered 2018-06-15 – 2018-06-16 (×2): 25 mg via ORAL
  Filled 2018-06-14 (×2): qty 1

## 2018-06-14 MED ORDER — ALUM & MAG HYDROXIDE-SIMETH 200-200-20 MG/5ML PO SUSP: 30.0000 mL | ORAL | Status: DC | PRN

## 2018-06-14 NOTE — Progress Notes (Signed)
Admission Note: Patient is a 32 year old female admitted to the unit for symptoms of anxiety, depression and bizarre behavior.  Patient presents with a blunted affect and depressed mood.  Patient states boyfriend brought her to the hospital due to complain of dizziness.  Reports poor sleep and appetite.  Admission plan of care reviewed and consent signed.  Skin assessment and personal belongings completed.  Skin is dry and intact.  No contraband found.  Patient is malodorous and unkempt.  Patient oriented to the unit, staff and room.  Routine safety checks initiated.  Verbalizes understanding of unit rules/protocol.  Patient is safe on the unit.

## 2018-06-14 NOTE — ED Notes (Signed)
Accepted at [facility] at 3pm rm 502-- dr Jeannine Kittenfarah attending. [phone] for report

## 2018-06-14 NOTE — Tx Team (Signed)
Initial Treatment Plan 06/14/2018 4:45 PM Debra Sullivan ION:[id number]    PATIENT STRESSORS: Financial difficulties Health problems Medication change or noncompliance   PATIENT STRENGTHS: Ability for insight Supportive family/friends   PATIENT IDENTIFIED PROBLEMS: "Medication"  "Sleep"  Anxiety  Depression               DISCHARGE CRITERIA:  Ability to meet basic life and health needs Adequate post-discharge living arrangements Motivation to continue treatment in a less acute level of care  PRELIMINARY DISCHARGE PLAN: Attend aftercare/continuing care group Outpatient therapy Return to previous living arrangement  PATIENT/FAMILY INVOLVEMENT: This treatment plan has been presented to and reviewed with the patient, Debra Sullivan, and/or family member.  The patient and family have been given the opportunity to ask questions and make suggestions.  Audrie LiaElizabeth O Benjamim Harnish, RN 06/14/2018, 4:45 PM

## 2018-06-14 NOTE — Progress Notes (Signed)
Pt is [facility]; room 500-2.  Reola Calkinsravis Money, NP is the accepting provider.   Dr. Jeannine KittenFarah is the attending provider.   Call report to [phone]  Clydie BraunKaren @ [facility] ED notified.    Pt is involuntary and will be transported by law enforcement Pt [facility] PM  Wells GuilesSarah Chonda Baney, LCSW, LCAS Disposition CSW [facility]/TTS [phone]

## 2018-06-14 NOTE — ED Notes (Signed)
Regular Diet was ordered for Lunch. 

## 2018-06-15 DIAGNOSIS — F3110 Bipolar disorder, current episode manic without psychotic features, unspecified: Secondary | ICD-10-CM

## 2018-06-15 LAB — TSH: TSH: 1.752 u[IU]/mL (ref 0.350–4.500)

## 2018-06-15 MED ORDER — POTASSIUM CHLORIDE CRYS ER 20 MEQ PO TBCR
20.0000 meq | EXTENDED_RELEASE_TABLET | Freq: Two times a day (BID) | ORAL | Status: AC
  Administered 2018-06-15 – 2018-06-16 (×3): 20 meq via ORAL
  Filled 2018-06-15 (×3): qty 1

## 2018-06-15 MED ORDER — NICOTINE POLACRILEX 2 MG MT GUM
2.0000 mg | CHEWING_GUM | OROMUCOSAL | Status: DC | PRN
  Administered 2018-06-15: 2 mg via ORAL

## 2018-06-15 MED ORDER — NICOTINE POLACRILEX 2 MG MT GUM
CHEWING_GUM | OROMUCOSAL | Status: AC
  Filled 2018-06-15: qty 1

## 2018-06-15 NOTE — Progress Notes (Signed)
D. Pt presents with an anxious affect and mood- calm and cooperative behavior- friendly upon approach- although mildly irritated over her situation. Pt has been reporting concern over being separated from her baby, and her upcoming court date on Monday- regarding child custody. Pt reports that she has been exhausted and overwhelmed, and has received no help from others.  Pt reports that before the incident, she had asked for someone to take her to [facility] to [facility] her therapist, but was unable to get a ride.  Pt has been visible on the unit interacting appropriately with peers and staff. Pt currently denies SI/HI and AVH and agrees to contact staff before acting on any harmful thoughts.  A. Labs and vitals monitored. Pt compliant with medications. Pt supported emotionally and encouraged to express concerns and ask questions.   R. Pt remains safe with 15 minute checks. Will continue POC.

## 2018-06-15 NOTE — Progress Notes (Signed)
Pt is new to the unit this evening just prior to the shift change.  She was observed at the beginning of the shift sitting in the dayroom with no interaction with other patients, but sitting calmly and appropriately.  Pt reports she feels safe on the unit.  She answered questions appropriately.  When she came to get her night time med, she wanted to know what circumstances brought her to the hospital.  Writer spent some time talking with her about the incident that happened.  Pt was accepting to the information.  Pt denies SI/HI/AVH. She took the medication willing.  Pt has been pleasant and cooperative with staff.  Support and encouragement offered.  Discharge plans are in process.  Safety maintained with q15 minute checks.
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
## 2018-06-15 NOTE — [facility] Group Notes (Signed)
  [facility] LCSW Group Therapy Note  Date/Time:  06/15/2018 11:15AM-12:00PM  Type of Therapy and Topic:  Group Therapy:  Feelings About Hospitalization  Participation Level:  None   Description of Group This process group involved patients discussing their feelings related to being hospitalized, as well as the benefits they see to being in the hospital.  These feelings and benefits were itemized.  The group then brainstormed specific ways in which they could seek those same benefits when they discharge and return home.  Therapeutic Goals 1. Patient will identify and describe positive and negative feelings related to hospitalization 2. Patient will verbalize benefits of hospitalization to themselves personally 3. Patients will brainstorm together ways they can obtain similar benefits in the outpatient setting, identify barriers to wellness and possible solutions  Summary of Patient Progress:  The patient expressed her primary feelings about being hospitalized are scared, and she kept asking to see CSW individually; otherwise did not talk in group.  She was told she will have her social work assessment this afternoon first thing.  Therapeutic Modalities Cognitive Behavioral Therapy Motivational Interviewing    Ambrose MantleMareida Grossman-Orr, LCSW 06/15/2018, 12:22 PM

## 2018-06-16 LAB — BASIC METABOLIC PANEL WITH GFR
Anion gap: 8 (ref 5–15)
BUN: 9 mg/dL (ref 6–20)
CO2: 22 mmol/L (ref 22–32)
Calcium: 8.7 mg/dL — ABNORMAL LOW (ref 8.9–10.3)
Chloride: 110 mmol/L (ref 98–111)
Creatinine, Ser: 0.75 mg/dL (ref 0.44–1.00)
GFR calc Af Amer: >60 mL/min
GFR calc non Af Amer: >60 mL/min
Glucose, Bld: 84 mg/dL (ref 70–99)
Potassium: 3.6 mmol/L (ref 3.5–5.1)
Sodium: 140 mmol/L (ref 135–145)

## 2018-06-16 MED ORDER — HALOPERIDOL 2 MG PO TABS
2.0000 mg | ORAL_TABLET | Freq: Two times a day (BID) | ORAL | Status: DC
  Administered 2018-06-16 – 2018-06-17 (×2): 2 mg via ORAL
  Filled 2018-06-16 (×5): qty 1

## 2018-06-16 NOTE — Progress Notes (Signed)
D. Pt visible on the unit- interacting appropriately with peers and staff. Per pt's self inventory, pt rates her depression, hopelessness and anxiety all 0's. Pt writes that her most important goal today is "making a plan to go home" and writes that she will "stay positive" to help her meet that goal  Pt currently denies SI/HI and AVH and agrees to contact staff before acting on any harmful thoughts.  A. Labs and vitals monitored. Pt compliant with medications. Pt supported emotionally and encouraged to express concerns and ask questions.   R. Pt remains safe with 15 minute checks. Will continue POC.

## 2018-06-16 NOTE — [facility] Suicide Risk Assessment (Signed)
[facility] INPATIENT:  Family/Significant Other Suicide Prevention Education  Suicide Prevention Education:  Education Completed; boyfriend Dexter [phone]-[phone],  (name of family member/significant other) has been identified by the patient as the family member/significant other with whom the patient will be residing, and identified as the person(s) who will aid the patient in the event of a mental health crisis (suicidal ideations/suicide attempt).  With written consent from the patient, the family member/significant other has been provided the following suicide prevention education, prior to the and/or following the discharge of the patient.  The suicide prevention education provided includes the following:  Suicide risk factors  Suicide prevention and interventions  National Suicide Hotline telephone number  [facility] assessment telephone number  [facility] Emergency Assistance 911  [facility] and/or Residential Mobile Crisis Unit telephone number  Request made of family/significant other to:  Remove weapons (e.g., guns, rifles, knives), all items previously/currently identified as safety concern.    Remove drugs/medications (over-the-counter, prescriptions, illicit drugs), all items previously/currently identified as a safety concern.  The family member/significant other verbalizes understanding of the suicide prevention education information provided.  The family member/significant other agrees to remove the items of safety concern listed above.  Denies patient has access to guns, reports concern that she is using and wants her tested. Reports would like a call at discharge, if possible.   Shellia CleverlyStephanie N Sharlene Mccluskey 06/16/2018, 10:57 AM

## 2018-06-16 NOTE — [facility] Group Notes (Signed)
[facility] LCSW Group Therapy Note  Date/Time:  06/16/2018  11:00AM-12:00PM  Type of Therapy and Topic:  Group Therapy:  Music and Mood  Participation Level:  Minimal   Description of Group: In this process group, members listened to a variety of genres of music and identified that different types of music evoke different responses.  Patients were encouraged to identify music that was soothing for them and music that was energizing for them.  Patients discussed how this knowledge can help with wellness and recovery in various ways including managing depression and anxiety as well as encouraging healthy sleep habits.    Therapeutic Goals: 1. Patients will explore the impact of different varieties of music on mood 2. Patients will verbalize the thoughts they have when listening to different types of music 3. Patients will identify music that is soothing to them as well as music that is energizing to them 4. Patients will discuss how to use this knowledge to assist in maintaining wellness and recovery 5. Patients will explore the use of music as a coping skill  Summary of Patient Progress:  At the beginning of group, patient expressed that she felt "wonderful."  She was out of group for a good part of it seeing a provider, but when she returned, she stated the music did make her feel better.  It is something she uses as a positive coping tool at home.  Therapeutic Modalities: Solution Focused Brief Therapy Activity   Ambrose MantleMareida Grossman-Orr, LCSW

## 2018-06-16 NOTE — Progress Notes (Addendum)
[facility]  06/16/2018 2:21 PM Lucila MaineBridget A Grzelak  MRN:  [id number] Subjective: I feel pretty good. I just want to know what happened to my baby, did I do anything to hurt her. I cant remember but I keep having dreams about It. I just want to know what I did.     Objective: Case discussed during treatment team  and chart reviewed. During this evaluation patient remains alert and oriented x3, calm, and cooperative. Clarisse GougeBridget continues to show improvement in treatment as good response to current prn medication patient with situational depression, some mania, and disorganized thoughts who was IVC by boyfriend due to inappropriate behaviors. She is unable to offer any insight at this time in regards to her admission and recollection of the events leading to today. Clarisse GougeBridget continues to exhibit symptoms of depression although she notes overall improvement since her admission. Sleep and eating patterns remains unchanged without difficulty. No irritability noted or reported and patient continues to engage well with both peers and staff. She continues to refute any active or passive suicidal.  At current, she is able to contract for safety on the unit.       Principal Problem: <principal problem not specified> Diagnosis: Active Problems:   Bipolar I disorder, most recent episode (or current) manic (HCC)  Total Time spent with patient: 30 minutes  Past Psychiatric History:one prior psychiatric admission a in 2016.  At the time was admitted for depression,  paranoia.At the time she was discharged on Zoloft, Haldol, Cogentin.Does not endorse any clear history of mania, but does states she has been diagnosed with Bipolar Disorder in the past and that [facility]   She denies history of self cutting or of suicide attempts, denies history of psychosis.Describes history of PTSD , as above. Substance Abuse History in the last 12 months:  Reports history of cocaine abuse years ago, but  states she has been sober for years. Denies alcohol abuse. Smokes cannabis about twice a week. Consequences of Substance Abuse: Denies  Previous Psychotropic Medications: Was not taking medications prior to admission .  In the past has been on Zoloft, Haldol, Cogentin.  Past Medical History:  Past Medical History:  Diagnosis Date  . Anemia   . Drug abuse (HCC)   . Psychosis (HCC) 2013    Past Surgical History:  Procedure Laterality Date  . CESAREAN SECTION  03/2016   [facility]  . LAPAROSCOPY N/A 04/14/2017   Procedure: LAPAROSCOPY OPERATIVE WITH RIGHT SALPINGECTOMY;  Surgeon: Conan Bowensavis, Kelly M, MD;  Location: [facility];  Service: Gynecology;  Laterality: N/A;  . TUBAL LIGATION Bilateral 01/22/2018   Procedure: POST PARTUM TUBAL LIGATION;  Surgeon: Levie HeritageStinson, Jacob J, DO;  Location: [facility];  Service: Gynecology;  Laterality: Bilateral;   Family History:  Family History  Problem Relation Age of Onset  . Diabetes Mother   . Schizophrenia Mother   . Diabetes Brother   . Hypertension Maternal Aunt    Family Psychiatric  History: reports she thinks her mother has Bipolar Disorder, no suicides in family Social History:  Social History   Substance and Sexual Activity  Alcohol Use No  . Alcohol/week: 0.0 standard drinks     Social History   Substance and Sexual Activity  Drug Use Yes  . Types: Marijuana, Cocaine   Comment: Cocaine & Marijuana was used10/26/2018    Social History   Socioeconomic History  . Marital status: Single  Spouse name: Not on file  . Number of children: Not on file  . Years of education: Not on file  . Highest education level: Not on file  Occupational History  . Not on file  Social Needs  . Financial resource strain: Not on file  . Food insecurity:    Worry: Not on file    Inability: Not on file  . Transportation needs:    Medical: Not on file    Non-medical: Not on file  Tobacco Use  . Smoking status: Former  Smoker    Packs/day: 0.25    Types: Cigarettes    Last attempt to quit: 10/03/2017    Years since quitting: 0.7  . Smokeless tobacco: Never Used  Substance and Sexual Activity  . Alcohol use: No    Alcohol/week: 0.0 standard drinks  . Drug use: Yes    Types: Marijuana, Cocaine    Comment: Cocaine & Marijuana was used10/26/2018  . Sexual activity: Yes    Birth control/protection: None  Lifestyle  . Physical activity:    Days per week: Not on file    Minutes per session: Not on file  . Stress: Not on file  Relationships  . Social connections:    Talks on phone: Not on file    Gets together: Not on file    Attends religious service: Not on file    Active member of club or organization: Not on file    Attends meetings of clubs or organizations: Not on file    Relationship status: Not on file  Other Topics Concern  . Not on file  Social History Narrative  . Not on file   Additional Social History:    Sleep: Good  Appetite:  Poor  Current Medications: Current Facility-Administered Medications  Medication Dose Route Frequency Provider Last Rate Last Dose  . acetaminophen (TYLENOL) tablet 650 mg  650 mg Oral Q6H PRN Money, Gerlene Burdockravis B, FNP      . alum & mag hydroxide-simeth (MAALOX/MYLANTA) 200-200-20 MG/5ML suspension 30 mL  30 mL Oral Q4H PRN Money, Feliz Beamravis B, FNP      . diphenhydrAMINE (BENADRYL) capsule 50 mg  50 mg Oral Q6H PRN Money, Gerlene Burdockravis B, FNP       Or  . diphenhydrAMINE (BENADRYL) injection 50 mg  50 mg Intramuscular Q6H PRN Money, Feliz Beamravis B, FNP      . haloperidol (HALDOL) tablet 5 mg  5 mg Oral Q6H PRN Money, Feliz Beamravis B, FNP       Or  . haloperidol lactate (HALDOL) injection 5 mg  5 mg Intramuscular Q6H PRN Money, Gerlene Burdockravis B, FNP      . hydrOXYzine (ATARAX/VISTARIL) tablet 25 mg  25 mg Oral TID PRN Money, Gerlene Burdockravis B, FNP   25 mg at 06/15/18 2040  . LORazepam (ATIVAN) tablet 1 mg  1 mg Oral Q6H PRN Money, Gerlene Burdockravis B, FNP   1 mg at 06/14/18 2054   Or  . LORazepam (ATIVAN)  injection 1 mg  1 mg Intramuscular Q6H PRN Money, Feliz Beamravis B, FNP      . magnesium hydroxide (MILK OF MAGNESIA) suspension 30 mL  30 mL Oral Daily PRN Money, Feliz Beamravis B, FNP      . nicotine polacrilex (NICORETTE) gum 2 mg  2 mg Oral PRN Malvin JohnsFarah, Brian, MD   2 mg at 06/15/18 1642  . potassium chloride SA (K-DUR,KLOR-CON) CR tablet 20 mEq  20 mEq Oral BID Ashanna Heinsohn, Rockey SituFernando A, MD   20 mEq at 06/16/18 0820  .  traZODone (DESYREL) tablet 50 mg  50 mg Oral QHS PRN Money, Gerlene Burdock, FNP   50 mg at 06/15/18 2040    Lab Results:  Results for orders placed or performed during the hospital encounter of 06/14/18 (from the past 48 hour(s))  TSH     Status: None   Collection Time: 06/15/18  6:26 AM  Result Value Ref Range   TSH 1.752 0.350 - 4.500 uIU/mL    Comment: Performed by a 3rd Generation assay with a functional sensitivity of <=0.01 uIU/mL. Performed at [facility], [address] [place], [place] [address]   Basic metabolic panel     Status: Abnormal   Collection Time: 06/16/18  6:47 AM  Result Value Ref Range   Sodium 140 135 - 145 mmol/L   Potassium 3.6 3.5 - 5.1 mmol/L   Chloride 110 98 - 111 mmol/L   CO2 22 22 - 32 mmol/L   Glucose, Bld 84 70 - 99 mg/dL   BUN 9 6 - 20 mg/dL   Creatinine, Ser 6.04 0.44 - 1.00 mg/dL   Calcium 8.7 (L) 8.9 - 10.3 mg/dL   GFR calc non Af Amer >60 >60 mL/min   GFR calc Af Amer >60 >60 mL/min   Anion gap 8 5 - 15    Comment: Performed at [facility], [address] [place], [place] [address]    Blood Alcohol level:  Lab Results  Component Value Date   ETH <10 06/13/2018   ETH <5 04/13/2015    Metabolic Disorder Labs: Lab Results  Component Value Date   HGBA1C 5.0 11/27/2017   MPG 108 04/16/2015   Lab Results  Component Value Date   PROLACTIN 43.5 (H) 04/16/2015   No results found for: CHOL, TRIG, HDL, CHOLHDL, VLDL, LDLCALC  Physical Findings: AIMS: Facial and Oral Movements Muscles of Facial Expression:  None, normal Lips and Perioral Area: None, normal Jaw: None, normal Tongue: None, normal,Extremity Movements Upper (arms, wrists, hands, fingers): None, normal Lower (legs, knees, ankles, toes): None, normal, Trunk Movements Neck, shoulders, hips: None, normal, Overall Severity Severity of abnormal movements (highest score from questions above): None, normal Incapacitation due to abnormal movements: None, normal Patient's awareness of abnormal movements (rate only patient's report): No Awareness, Dental Status Current problems with teeth and/or dentures?: No Does patient usually wear dentures?: No  CIWA:    COWS:     Musculoskeletal: Strength & Muscle Tone: within normal limits Gait & Station: normal Patient leans: N/A  Psychiatric Specialty Exam: Physical Exam  ROS  Blood pressure 118/78, pulse (!) 105, temperature 98.2 F (36.8 C), temperature source Oral, resp. rate 18, height 5\' 6"  (1.676 m), weight 79.4 kg, last menstrual period 05/22/2018, SpO2 98 %, not currently breastfeeding.Body mass index is 28.25 kg/m.  General Appearance: Disheveled  Eye Contact:  Fair  Speech:  Blocked  Volume:  Normal  Mood:  Euphoric  Affect:  Depressed, Flat and Full Range  Thought Process:  Goal Directed and Irrelevant  Orientation:  Full (Time, Place, and Person)  Thought Content:  Delusions, Paranoid Ideation and Rumination  Suicidal Thoughts:  No  Homicidal Thoughts:  No  Memory:  Immediate;   Fair Recent;   Good  Judgement:  Impaired  Insight:  Lacking  Psychomotor Activity:  Increased  Concentration:  Concentration: Fair and Attention Span: Fair  Recall:  [employer] of Knowledge:  Fair  Language:  Fair  Akathisia:  No  Handed:  Right  AIMS (  if indicated):     Assets:  Financial Resources/Insurance Physical Health Resilience Talents/Skills  ADL's:  Intact  Cognition:  WNL  Sleep:  Number of Hours: 6.75     Treatment Plan Summary: Daily contact with patient to assess  and evaluate symptoms and progress in treatment and Medication management   Plan: Review of chart, vital signs, medications, and notes.  1-Admit for crisis management and stabilization. Estimated length of stay 5-7 days past his current stay of 1  2-Individual and group therapy encouraged  3-Medication management for depression and anxiety to reduce current symptoms to base line and improve the patient's overall level of functioning: Medications reviewed with the patient and Trazodone added for sleep issues and Vistaril 25 mg every six hours PRN anxiety. Will continue haldol 2mg  po BID. Patient has not exhibited any disruptive behaviors while  In milieu. Will discontinue agitation protocol at this time for agitation and psychosis.  Asymptomatic bacteruria- Will obtain STD panel as urine is positive for leukocytes and negative for nitrites. She denies any symptoms at this time. Will also obtain STD panel at this time due to risky behaviors while manic.  4-Coping skills for depression, substance abuse, anger issues, and anxiety developing--  5-Continue crisis stabilization and management. Patient has been able to control her behaviors and is not displaying any signs of mania at this time.  6-Address health issues--monitoring vital signs, stable  7-Treatment plan in progress to prevent relapse of depression, angry outbursts, and anxiety  8-Psychosocial education regarding relapse prevention and self-care  9-Health care follow up as needed for any health concerns  10-Call for consult with hospitalist for additional specialty patient services as needed.  Maryagnes Amos, FNP 06/16/2018, 2:21 PM    ..Agree with NP Progress Note

## 2018-06-16 NOTE — Progress Notes (Signed)
Nursing note 7p-7a  Pt observed interacting with peers on unit this shift. Displayed a bright affect and mood upon interaction with this Clinical research associatewriter. Pt denies pain ,denies SI/HI, and also denies any audio or visual hallucinations at this time. Pt complains of anxiety and insomnia, see MAR for prn medication administration.  Pt is able to verbally contract for safety with this RN. Goal: "go home"  Pt educated on falls and encouraged to wear nonskid socks when ambulating in the halls. Pt also encouraged to call for help if feeling weak or dizzy. Pt verbalized understanding of all education provided. Pt is now resting in bed with eyes closed, with no signs or symptoms of pain or distress noted. Pt continues to remain safe on the unit and is observed by rounding every 15 min. RN will continue to monitor.

## 2018-06-17 DIAGNOSIS — F419 Anxiety disorder, unspecified: Secondary | ICD-10-CM

## 2018-06-17 LAB — GC/CHLAMYDIA PROBE AMP (~~LOC~~) NOT AT ARMC
Chlamydia: NEGATIVE
Neisseria Gonorrhea: NEGATIVE
Trichomonas: POSITIVE — AB

## 2018-06-17 MED ORDER — NICOTINE POLACRILEX 2 MG MT GUM: 2.0000 mg | CHEWING_GUM | OROMUCOSAL | 0 refills | Status: DC | PRN

## 2018-06-17 MED ORDER — BENZTROPINE MESYLATE 1 MG PO TABS: 1.0000 mg | ORAL_TABLET | Freq: Every day | ORAL | 0 refills | Status: DC

## 2018-06-17 MED ORDER — RISPERIDONE 4 MG PO TABS: 4.0000 mg | ORAL_TABLET | Freq: Every day | ORAL | 0 refills | Status: DC

## 2018-06-17 MED ORDER — TRAZODONE HCL 50 MG PO TABS: 50.0000 mg | ORAL_TABLET | Freq: Every evening | ORAL | 0 refills | Status: DC | PRN

## 2018-06-17 MED ORDER — RISPERIDONE 2 MG PO TABS
4.0000 mg | ORAL_TABLET | Freq: Every day | ORAL | Status: DC
  Filled 2018-06-17 (×2): qty 2

## 2018-06-17 MED ORDER — BENZTROPINE MESYLATE 1 MG PO TABS
1.0000 mg | ORAL_TABLET | Freq: Every day | ORAL | Status: DC
  Filled 2018-06-17 (×2): qty 1

## 2018-06-17 MED ORDER — HYDROXYZINE HCL 25 MG PO TABS: 25.0000 mg | ORAL_TABLET | Freq: Three times a day (TID) | ORAL | 0 refills | Status: DC | PRN

## 2018-06-17 NOTE — [facility] Suicide Risk Assessment (Signed)
[facility]   Principal Problem: Disorganized behavior in the context of an untreated bipolar type I/manic episode and cannabis dependency Discharge Diagnoses: Active Problems:   Bipolar I disorder, most recent episode (or current) manic (HCC)   Total Time spent with patient: 30 minutes  Return to cooperative without thoughts of harming self or others contracting fully no mania thoughts coherent and goal-directed mood euthymic speech normal rate and tone no psychosis no EPS or TD no dangerousness  Mental Status Per Nursing Assessment::   On Admission:  NA  Demographic Factors:  Unemployed  Loss Factors: Decrease in vocational status  Historical Factors: NA  Risk Reduction Factors:   Religious beliefs about death  Continued Clinical Symptoms:  Bipolar Disorder:   Mixed State  Cognitive Features That Contribute To Risk:  None    Suicide Risk:  Minimal: No identifiable suicidal ideation.  Patients presenting with no risk factors but with morbid ruminations; may be classified as minimal risk based on the severity of the depressive symptoms  Follow-up Information    [facility] on 06/20/2018.   Why:  Please attend your hospital follow up appointment on 06/20/18 at 8:00am Contact information: [address] [place] [address] [phone]           Plan Of Care/Follow-up recommendations:  Activity:  full  Haizel Gatchell, MD 06/17/2018, 9:44 AM

## 2018-06-17 NOTE — Progress Notes (Signed)
Patient informed weekend CSW of child custody court date for today (06/17/18) at 9:30am. CSW granted verbal permission from patient to fax a letter to clerk of court.  [facility] [place] [address] [phone]). Dagoberto ReefClerk of Court states there are no court dates this week other than emergency court proceedings. CSW provided name of patient and requested clerk double check. Dagoberto ReefClerk informed CSW that patient has a court date on 06/25/17.   CSW to leave court letter on patient's chart for discharge as back up.   Enid Cutterharlotte Tarique Loveall, LCSW-A Clinical Social Worker

## 2018-06-17 NOTE — Discharge Summary (Signed)
Physician Discharge Summary Note  Patient:  Debra Sullivan is an 32 y.o., female  MRN:  [id number]  DOB:  June 01, 1986  Patient phone:  [phone] (home)   Patient address:   [address] [place] [address],   Total Time spent with patient: Greater than 30 minutes  Date of Admission:  06/14/2018  Date of Discharge: 06-17-18  Reason for Admission: Bizarre, strange behaviors such as leaving her 43 month old alone, walking naked on the street, cutting off her hair, rearranging furniture (taking furniture out on balcony).  Principal Problem: Bipolar I disorder, most recent episode (or current) manic (HCC)  Discharge Diagnoses: Principal Problem:   Bipolar I disorder, most recent episode (or current) manic (HCC)  Past Psychiatric History: See H&P  Past Medical History:  Past Medical History:  Diagnosis Date  . Anemia   . Drug abuse (HCC)   . Psychosis (HCC) 2013    Past Surgical History:  Procedure Laterality Date  . CESAREAN SECTION  03/2016   [facility]LTCS for twin B at [facility]  . LAPAROSCOPY N/A 04/14/2017   Procedure: LAPAROSCOPY OPERATIVE WITH RIGHT SALPINGECTOMY;  Surgeon: Conan Bowens, MD;  Location: [facility];  Service: Gynecology;  Laterality: N/A;  . TUBAL LIGATION Bilateral 01/22/2018   Procedure: POST PARTUM TUBAL LIGATION;  Surgeon: [name], DO;  Location: [facility];  Service: Gynecology;  Laterality: Bilateral;   Family History:  Family History  Problem Relation Age of Onset  . Diabetes Mother   . Schizophrenia Mother   . Diabetes Brother   . Hypertension Maternal Aunt    Family Psychiatric  History: See H&P  Social History:  Social History   Substance and Sexual Activity  Alcohol Use No  . Alcohol/week: 0.0 standard drinks     Social History   Substance and Sexual Activity  Drug Use Yes  . Types: Marijuana, Cocaine   Comment: Cocaine & Marijuana was used10/26/2018    Social History   Socioeconomic History  . Marital  status: Single    Spouse name: Not on file  . Number of children: Not on file  . Years of education: Not on file  . Highest education level: Not on file  Occupational History  . Not on file  Social Needs  . Financial resource strain: Not on file  . Food insecurity:    Worry: Not on file    Inability: Not on file  . Transportation needs:    Medical: Not on file    Non-medical: Not on file  Tobacco Use  . Smoking status: Former Smoker    Packs/day: 0.25    Types: Cigarettes    Last attempt to quit: 10/03/2017    Years since quitting: 0.7  . Smokeless tobacco: Never Used  Substance and Sexual Activity  . Alcohol use: No    Alcohol/week: 0.0 standard drinks  . Drug use: Yes    Types: Marijuana, Cocaine    Comment: Cocaine & Marijuana was used10/26/2018  . Sexual activity: Yes    Birth control/protection: None  Lifestyle  . Physical activity:    Days per week: Not on file    Minutes per session: Not on file  . Stress: Not on file  Relationships  . Social connections:    Talks on phone: Not on file    Gets together: Not on file    Attends religious service: Not on file    Active member of club or organization: Not on file  Attends meetings of clubs or organizations: Not on file    Relationship status: Not on file  Other Topics Concern  . Not on file  Social History Narrative  . Not on file   Hospital Course: (Per Md's admission evaluation): 32 year old female, presented to ED with BF on 12/26, who reported bizarre, strange behaviors such as leaving her 564 month old alone, walking naked on the street, cutting off her hair, rearranging furniture ( taking furniture out on balcony". Patient denies these behaviors and  states she is upset because she feels she was  "tricked" by BF, as she was not clear as to why she was brought to the hospital, and states she actually thought she was accompanying him.  Denies above concerns and states " I have been doing OK, spending all my time  with my kids, getting them gifts for Christmas". States, regarding above report- "I was simply  outside in my balcony  with a long blanket, nothing inappropriate". She states she suspects her BF has an ulterior motive, wanting to get custody of the child. She minimizes depression, denies depression leading up to admission. She denies racing thoughts or increased goal directed activity , and currently is not presenting with pressured speech or flight of ideations. Does not endorse neuro-vegetative symptoms of depression, denies anhedonia, denies changes in sleep, appetite or energy level. Admission UDS positive for Cannabis, BAL negative.  Debra Sullivan [facility] for crisis management due to worsening symptoms of Bipolar disorder presented as bizarre, strange behaviors such as leaving her 644 month old alone, walking naked on the street, cutting off her hair, re-arranging furniture (taking furniture out on balcony. Patient has hx of Major depressive disorder, polysubstance dependency even during pregnancy, PTSD & psychosis.  She was recommended for mood stabilization treatments.    After evaluation of her presenting symptoms, Debra Sullivan was started on the medication regimen targeting those presenting symptoms. Their indications & side effects were explained to her. She was allowed & given time to ask any questions she may have about her treatment recommendations. She received & was discharged on; Cogentin 1 mg for EPS, Vistaril 25 mg prn for anxiety, Nicorette gum 2 mg for smoking cessation, Risperdal 4 mg for mood control & Trazodone 50 mg prn for insomnia. She was enrolled & participated in the group counseling sessions being offered & held on this unit. She learned coping skills. She presented no other significant pre-existing medical problems that required treatment & or monitoring. She tolerated her treatment regimen without any adverse effects or reactions reported.  During the course of her  hosptalization, Karla's improvement was monitored by observation & her daily report of symptom reduction noted.  Her emotional & mental status were monitored by the daily self-inventory reports completed by her & the clinical staff. She reported continued improvement on daily basis & denied any new concerns. She was evaluated by the treatment team on daily basis for mood stability and plans for continued recovery after discharge. She was offered further treatment options upon discharge on an outpatient basis as noted below. She was encouraged to maintain satisfactory support network & home environment as this will aid her in maintaining mood stability. She was instructed & encouraged to adhere to her medication regimen as recommended by her treatment team.    Debra Sullivan was seen this morning by the attending psychiatrist for discharge. Her reason for admission, treatment plans & response to treatment discussed. She endorsed that she is doing  well & ready to be discharged to continue mental health care on an outpatient basis as noted below. She was provided with all the necessary information needed to make this appointment without problems. Upon discharge, she was both mentally and medically stable denying suicidal/homicidal ideation, auditory/visual/tactile hallucinations, delusional thoughts & or paranoia. She was able to engage in safety planning including plan to return to [facility] or contact emergency services if she feels unable to maintain her own safety or the safety of others. Pt had no further questions, comments or concerns. She left [facility] with all personal belongings in no distress.    Physical Findings: AIMS: Facial and Oral Movements Muscles of Facial Expression: None, normal Lips and Perioral Area: None, normal Jaw: None, normal Tongue: None, normal,Extremity Movements Upper (arms, wrists, hands, fingers): None, normal Lower (legs, knees, ankles, toes): None, normal, Trunk Movements Neck,  shoulders, hips: None, normal, Overall Severity Severity of abnormal movements (highest score from questions above): None, normal Incapacitation due to abnormal movements: None, normal Patient's awareness of abnormal movements (rate only patient's report): No Awareness, Dental Status Current problems with teeth and/or dentures?: No Does patient usually wear dentures?: No  CIWA:    COWS:     Musculoskeletal: Strength & Muscle Tone: within normal limits Gait & Station: normal Patient leans: N/A  Psychiatric Specialty Exam: Physical Exam  Constitutional: She appears well-developed.  HENT:  Head: Normocephalic.  Eyes: Pupils are equal, round, and reactive to light.  Cardiovascular: Normal rate.  Respiratory: Effort normal.  Genitourinary:    Genitourinary Comments: Deferred   Musculoskeletal: Normal range of motion.  Neurological: She is alert.  Skin: Skin is warm.    Review of Systems  Constitutional: Negative.   Eyes: Negative.   Respiratory: Negative.  Negative for cough and shortness of breath.   Cardiovascular: Negative.  Negative for chest pain and palpitations.  Gastrointestinal: Negative.  Negative for abdominal pain, heartburn, nausea and vomiting.  Genitourinary: Negative.   Musculoskeletal: Negative.   Skin: Negative.   Neurological: Negative.  Negative for dizziness and headaches.  Endo/Heme/Allergies: Negative.   Psychiatric/Behavioral: Positive for depression (Stable), hallucinations (Hx. Psychosis) and substance abuse (Hx. Cannabis use disorder). Negative for memory loss and suicidal ideas. The patient has insomnia (Stable). The patient is not nervous/anxious (Stable).     Blood pressure 130/74, pulse (!) 111, temperature 98.2 F (36.8 C), temperature source Oral, resp. rate 18, height 5\' 6"  (1.676 m), weight 79.4 kg, last menstrual period 05/22/2018, SpO2 98 %, not currently breastfeeding.Body mass index is 28.25 kg/m.  See Md's discharge SRA   Have you used  any form of tobacco in the last 30 days? (Cigarettes, Smokeless Tobacco, Cigars, and/or Pipes): No  Has this patient used any form of tobacco in the last 30 days? (Cigarettes, Smokeless Tobacco, Cigars, and/or Pipes): Yes, A an FDA-approved tobacco cessation medication was offered at discharge.  Blood Alcohol level:  Lab Results  Component Value Date   ETH <10 06/13/2018   ETH <5 04/13/2015   Metabolic Disorder Labs:  Lab Results  Component Value Date   HGBA1C 5.0 11/27/2017   MPG 108 04/16/2015   Lab Results  Component Value Date   PROLACTIN 43.5 (H) 04/16/2015   No results found for: CHOL, TRIG, HDL, CHOLHDL, VLDL, LDLCALC  See Psychiatric Specialty Exam and Suicide Risk Assessment completed by Attending Physician prior to discharge.  Discharge destination:  Home  Is patient on multiple antipsychotic therapies at discharge:  No   Has Patient had three or  more failed trials of antipsychotic monotherapy by history:  No  Recommended Plan for Multiple Antipsychotic Therapies: NA  Allergies as of 06/17/2018      Reactions   Naproxen Anaphylaxis      Medication List    STOP taking these medications   PREPLUS 27-1 MG Tabs     TAKE these medications     Indication  benztropine 1 MG tablet Commonly known as:  COGENTIN Take 1 tablet (1 mg total) by mouth at bedtime. For prevention of drug induced tremors  Indication:  Extrapyramidal Reaction caused by Medications   hydrOXYzine 25 MG tablet Commonly known as:  ATARAX/VISTARIL Take 1 tablet (25 mg total) by mouth 3 (three) times daily as needed for anxiety.  Indication:  Feeling Anxious   nicotine polacrilex 2 MG gum Commonly known as:  NICORETTE Take 1 each (2 mg total) by mouth as needed for smoking cessation. (May buy from over the counter): For smoking cessation  Indication:  Nicotine Addiction   risperidone 4 MG tablet Commonly known as:  RISPERDAL Take 1 tablet (4 mg total) by mouth at bedtime. For mood  control  Indication:  Mood control   traZODone 50 MG tablet Commonly known as:  DESYREL Take 1 tablet (50 mg total) by mouth at bedtime as needed for sleep.  Indication:  Trouble Sleeping      Follow-up [employer]    [facility]. Go on 06/18/2018.   Why:  Please attend your hospital follow up appointment on Tuesday, 06/18/18 at 12:00p. Please bring: photo ID, proof of insurance, social secuirty card, and any discharge paperwork. Contact information: [address] [place] [address] [phone]          Follow-up recommendations: Activity:  As tolerated Diet: As recommended by your primary care doctor. Keep all scheduled follow-up appointments as recommended.   Comments: Patient is instructed prior to discharge to: Take all medications as prescribed by his/her mental healthcare provider. Report any adverse effects and or reactions from the medicines to his/her outpatient provider promptly. Patient has been instructed & cautioned: To not engage in alcohol and or illegal drug use while on prescription medicines. In the event of worsening symptoms, patient is instructed to call the crisis hotline, 911 and or go to the nearest ED for appropriate evaluation and treatment of symptoms. To follow-up with his/her primary care provider for your other medical issues, concerns and or health care needs.   Signed: Armandina StammerAgnes Bertha Lokken, NP, PMHNP, FNP-BC 06/17/2018, 1:43 PM

## 2018-06-17 NOTE — Plan of Care (Signed)
  Problem: Education: Goal: Knowledge of [facility] Education information/materials will improve Outcome: Adequate for Discharge   Problem: Education: Goal: Emotional status will improve Outcome: Adequate for Discharge   Problem: Education: Goal: Mental status will improve Outcome: Adequate for Discharge   Problem: Education: Goal: Verbalization of understanding the information provided will improve Outcome: Adequate for Discharge   Problem: Coping: Goal: Ability to identify and develop effective coping behavior will improve Outcome: Adequate for Discharge   

## 2018-06-17 NOTE — Tx Team (Signed)
Interdisciplinary Treatment and Diagnostic Plan Update  06/17/2018 Time of Session: 9:00am Debra Sullivan MRN: [id number]  Principal Diagnosis: <principal problem not specified>   Secondary Diagnoses: Active Problems:   Bipolar I disorder, most recent episode (or current) manic (HCC)   Current Medications:  Current Facility-Administered Medications  Medication Dose Route Frequency Provider Last Rate Last Dose  . acetaminophen (TYLENOL) tablet 650 mg  650 mg Oral Q6H PRN Money, Gerlene Burdockravis B, FNP      . alum & mag hydroxide-simeth (MAALOX/MYLANTA) 200-200-20 MG/5ML suspension 30 mL  30 mL Oral Q4H PRN Money, Feliz Beamravis B, FNP      . diphenhydrAMINE (BENADRYL) capsule 50 mg  50 mg Oral Q6H PRN Money, Gerlene Burdockravis B, FNP       Or  . diphenhydrAMINE (BENADRYL) injection 50 mg  50 mg Intramuscular Q6H PRN Money, Feliz Beamravis B, FNP      . haloperidol (HALDOL) tablet 2 mg  2 mg Oral BID Maryagnes AmosStarkes-Perry, Takia S, FNP   2 mg at 06/17/18 [id number]  . hydrOXYzine (ATARAX/VISTARIL) tablet 25 mg  25 mg Oral TID PRN Money, Gerlene Burdockravis B, FNP   25 mg at 06/16/18 2102  . magnesium hydroxide (MILK OF MAGNESIA) suspension 30 mL  30 mL Oral Daily PRN Money, Feliz Beamravis B, FNP      . nicotine polacrilex (NICORETTE) gum 2 mg  2 mg Oral PRN Malvin JohnsFarah, Brian, MD   2 mg at 06/15/18 1642  . traZODone (DESYREL) tablet 50 mg  50 mg Oral QHS PRN Money, Gerlene Burdockravis B, FNP   50 mg at 06/16/18 2102   PTA Medications: Medications Prior to Admission  Medication Sig Dispense Refill Last Dose  . Prenatal Vit-Fe Fumarate-FA (PREPLUS) 27-1 MG TABS Take 1 tablet by mouth daily. 30 tablet 13 Taking    Patient Stressors: Financial difficulties Health problems Medication change or noncompliance  Patient Strengths: Ability for insight Supportive family/friends  Treatment Modalities: Medication Management, Group therapy, Case management,  1 to 1 session with clinician, Psychoeducation, Recreational therapy.   Physician Treatment Plan for Primary Diagnosis:  <principal problem not specified> Long Term Goal(s): Improvement in symptoms so as ready for discharge Improvement in symptoms so as ready for discharge   Short Term Goals: Ability to identify changes in lifestyle to reduce recurrence of condition will improve Ability to maintain clinical measurements within normal limits will improve Ability to identify changes in lifestyle to reduce recurrence of condition will improve Ability to maintain clinical measurements within normal limits will improve  Medication Management: Evaluate patient's response, side effects, and tolerance of medication regimen.  Therapeutic Interventions: 1 to 1 sessions, Unit Group sessions and Medication administration.  Evaluation of Outcomes: Adequate for Discharge  Physician Treatment Plan for Secondary Diagnosis: Active Problems:   Bipolar I disorder, most recent episode (or current) manic (HCC)  Long Term Goal(s): Improvement in symptoms so as ready for discharge Improvement in symptoms so as ready for discharge   Short Term Goals: Ability to identify changes in lifestyle to reduce recurrence of condition will improve Ability to maintain clinical measurements within normal limits will improve Ability to identify changes in lifestyle to reduce recurrence of condition will improve Ability to maintain clinical measurements within normal limits will improve     Medication Management: Evaluate patient's response, side effects, and tolerance of medication regimen.  Therapeutic Interventions: 1 to 1 sessions, Unit Group sessions and Medication administration.  Evaluation of Outcomes: Adequate for Discharge   RN Treatment Plan for Primary Diagnosis: <principal problem not specified>  Long Term Goal(s): Knowledge of disease and therapeutic regimen to maintain health will improve  Short Term Goals: Ability to remain free from injury will improve, Ability to verbalize frustration and anger appropriately will  improve, Ability to demonstrate self-control, Ability to verbalize feelings will improve, Ability to disclose and discuss suicidal ideas and Compliance with prescribed medications will improve  Medication Management: RN will administer medications as ordered by provider, will assess and evaluate patient's response and provide education to patient for prescribed medication. RN will report any adverse and/or side effects to prescribing provider.  Therapeutic Interventions: 1 on 1 counseling sessions, Psychoeducation, Medication administration, Evaluate responses to treatment, Monitor vital signs and CBGs as ordered, Perform/monitor CIWA, COWS, AIMS and Fall Risk screenings as ordered, Perform wound care treatments as ordered.  Evaluation of Outcomes: Adequate for Discharge   LCSW Treatment Plan for Primary Diagnosis: <principal problem not specified> Long Term Goal(s): Safe transition to appropriate next level of care at discharge, Engage patient in therapeutic group addressing interpersonal concerns.  Short Term Goals: Engage patient in aftercare planning with referrals and resources, Increase social support, Increase emotional regulation, Identify triggers associated with mental health/substance abuse issues and Increase skills for wellness and recovery  Therapeutic Interventions: Assess for all discharge needs, 1 to 1 time with Social worker, Explore available resources and support systems, Assess for adequacy in community support network, Educate family and significant other(s) on suicide prevention, Complete Psychosocial Assessment, Interpersonal group therapy.  Evaluation of Outcomes: Adequate for Discharge   Progress in Treatment: Attending groups: Yes. Participating in groups: Yes. Taking medication as prescribed: Yes. Toleration medication: Yes. Family/Significant other contact made: Yes, individual(s) contacted:  boyfriend Patient understands diagnosis: Yes. Discussing patient  identified problems/goals with staff: Yes. Medical problems stabilized or resolved: Yes. Denies suicidal/homicidal ideation: Yes. Issues/concerns per patient self-inventory: Yes.  New problem(s) identified: No, Describe:  none  New Short Term/Long Term Goal(s): medication management for mood stabilization; elimination of SI thoughts; development of comprehensive mental wellness/sobriety plan.  Patient Goals:  "Get on the right meds, think before I act."  Discharge Plan or Barriers:  Continuing to assess, likely returning to shared home with boyfriend. MHAG pamphlet, Mobile Crisis information, and AA/NA information provided to patient for additional community support and resources.   Reason for Continuation of Hospitalization: Anxiety Depression Medication stabilization  Estimated Length of Stay: Discharge today  Attendees: Patient: 06/17/2018 8:32 AM  Physician: Dr.Farah 06/17/2018 8:32 AM  Nursing: Arlyss RepressAlyssa, RN 06/17/2018 8:32 AM  RN Care Manager: 06/17/2018 8:32 AM  Social Worker: Debra Sullivan, LCSWA 06/17/2018 8:32 AM  Recreational Therapist:  06/17/2018 8:32 AM  Other:  06/17/2018 8:32 AM  Other:  06/17/2018 8:32 AM  Other: 06/17/2018 8:32 AM    Scribe for Treatment Team: Debra Sullivan, LCSWA 06/17/2018 8:32 AM

## 2018-06-17 NOTE — Progress Notes (Addendum)
  [facility] Discharge Plan :  Will you be returning to the same living situation after discharge:  Yes,  returning home At discharge, do you have transportation home?: Yes,  bus pass Do you have the ability to pay for your medications: Yes,  Medicaid  Release of information consent forms completed and in the chart; bus pass and letters on patient's chart.  Patient to Follow up at: Follow-up Information    [facility]. Go on 06/20/2018.   Why:  Please attend your hospital follow up appointment on 06/20/18 at 8:00am Contact information: [address] [place] [address] [phone]           Next level of care provider has access to [facility] Link:no  Safety Planning and Suicide Prevention discussed: Yes,  with boyfriend  Have you used any form of tobacco in the last 30 days? (Cigarettes, Smokeless Tobacco, Cigars, and/or Pipes): No  Has patient been referred to the Quitline?: N/A patient is not a smoker  Patient has been referred for addiction treatment: Yes  Darreld McleanCharlotte C Renny Remer, LCSWA 06/17/2018, 9:34 AM

## 2018-06-17 NOTE — Progress Notes (Signed)
Recreation Therapy Notes  Date: 12.30.19 Time: 1015 Location: [address] Dayroom  Group Topic: Anger Management  Goal Area(s) Addresses:  Pt will identify things that cause them to get angry. Pt will identify their reaction to anger. Pt will identify things their anger has cost them. Pt will identify coping skills to dealing with anger.  Behavioral Response: Engaged  Intervention: Worksheet  Activity: Introduction to Anger Management.  Patients were to identify the things that cause their anger, their reactions to anger, the things their anger has cost them and positive coping skills they can use when angry.  Education: PharmacologistCoping Skills, Building control surveyorDischarge Planning.   Education Outcome: Acknowledges understanding/In group clarification offered/Needs additional education.   Clinical Observations/Feedback: Pt stated she gets angry when people lie, people touch her things without asking and people being in her personal space.  Pt expressed when she gets angry she throws things, break things and wants to fight.  Pt revealed her anger has cost her relationships, jobs, her house and her kids.  Pt identified her coping skills as "thinking before I act, talk about it, write, listen to music and go for Sullivan walk".  During discussion, pt expressed "it's easier to react but it's costing me too much".    Debra RancherMarjette Jalaine Sullivan, LRT/CTRS      Debra AbedLindsay, Debra Sullivan 06/17/2018 10:58 AM

## 2018-06-17 NOTE — Progress Notes (Signed)
D: Patient observed up and visible in the milieu. Patient states, "I'm doing well. The medications are really helping me." Patient's affect animated and anxious with congrurent mood. Pleasant and cooperative. Denies pain, physical complaints.   A: Medicated per orders, prn trazadone and vistaril given to promote rest. Medication education provided. Level III obs in place for safety. Emotional support offered. Patient encouraged to complete Suicide Safety Plan before discharge. Encouraged to attend and participate in unit programming.   R: Patient verbalizes understanding of POC. On reassess, patient is asleep. Patient denies SI/HI/AVH and remains safe on level III obs. Will continue to monitor throughout the night.

## 2018-06-17 NOTE — Plan of Care (Signed)
  Problem: Education: Goal: Verbalization of understanding the information provided will improve Outcome: Progressing   Problem: Health Behavior/Discharge Planning: Goal: Ability to make decisions will improve Outcome: Progressing

## 2018-06-17 NOTE — Progress Notes (Signed)
Discharge note: Patient reviewed discharge paperwork with RN including prescriptions, follow up appointments, and lab work. Patient given the opportunity to ask questions. All concerns were addressed. All belongings were returned to patient. Denied SI/HI/AVH. Patient thanked staff for their care while at the hospital.  Patient was discharged to lobby with a bus pass. 

## 2018-10-17 IMAGING — US US OB COMP LESS 14 WK
1 series · 15 of 28 positions shown · non-contrast
Comparison: None.

CLINICAL DATA: Pelvic pain. Gestational age by LMP of 5 weeks 2
days.

EXAM:
OBSTETRIC <14 WK US AND TRANSVAGINAL OB US
TECHNIQUE: Both transabdominal and transvaginal ultrasound examinations were
performed for complete evaluation of the gestation as well as the
maternal uterus, adnexal regions, and pelvic cul-de-sac.
Transvaginal technique was performed to assess early pregnancy.

[Series 1: us ob comp less 14 wk · 28 acquisitions, 15 frames shown]
[im 1/28]
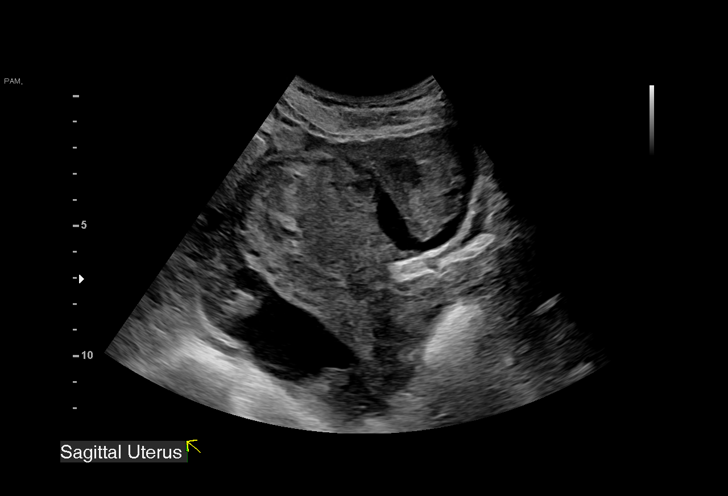
[im 3/28]
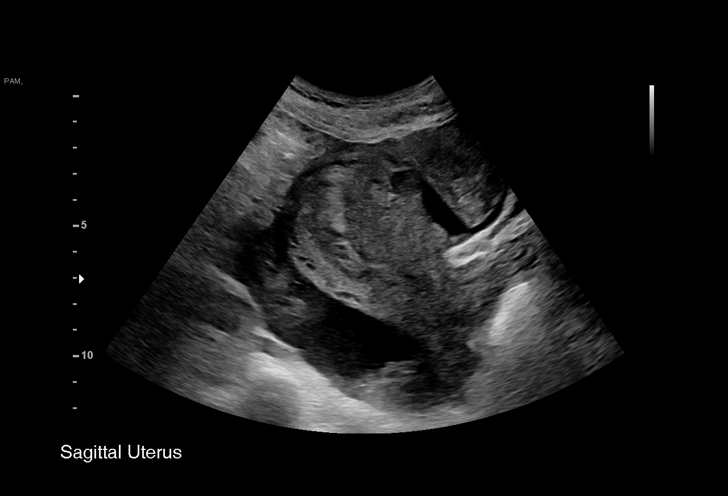
[im 5/28]
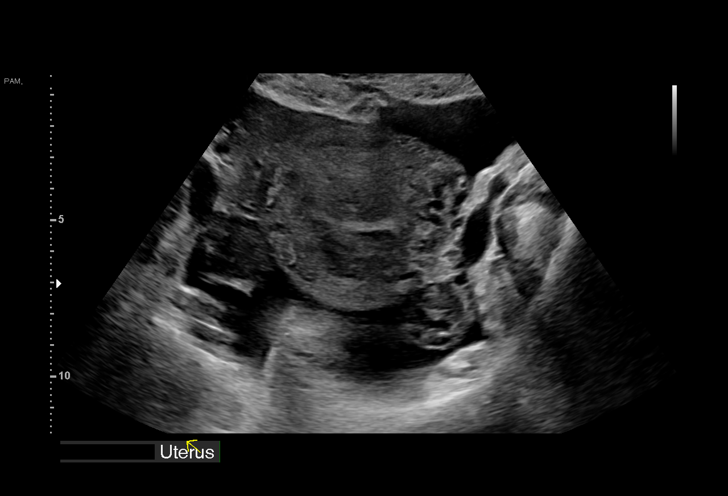
[im 7/28]
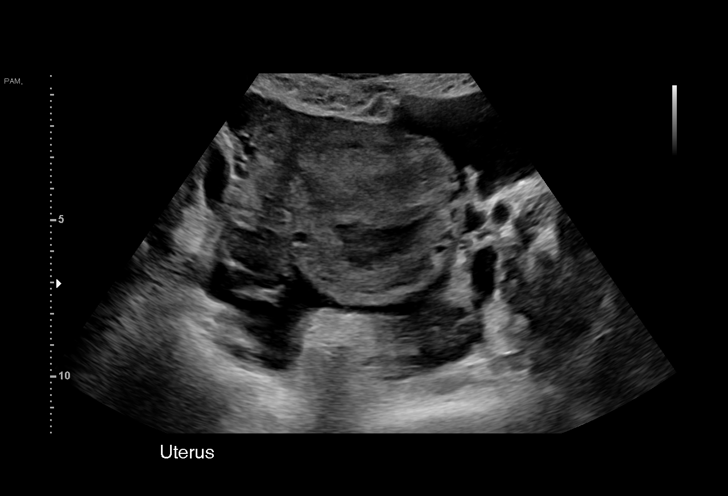
[im 9/28]
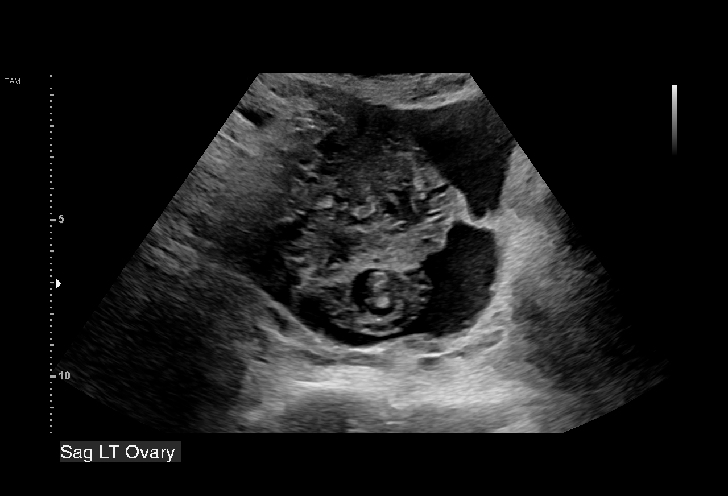
[im 11/28]
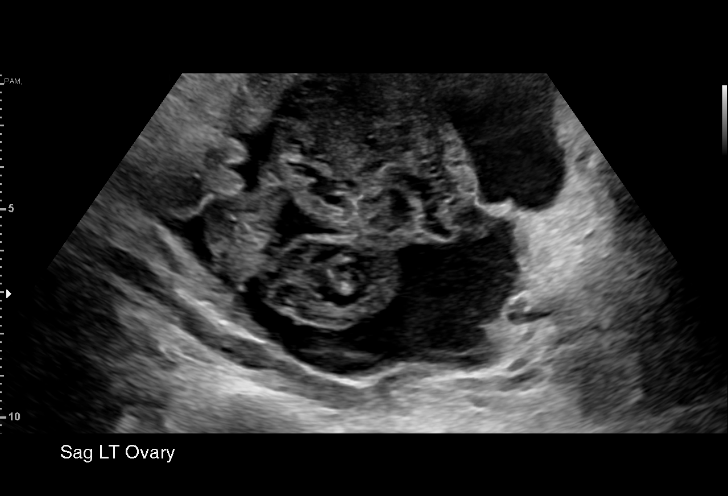
[im 13/28]
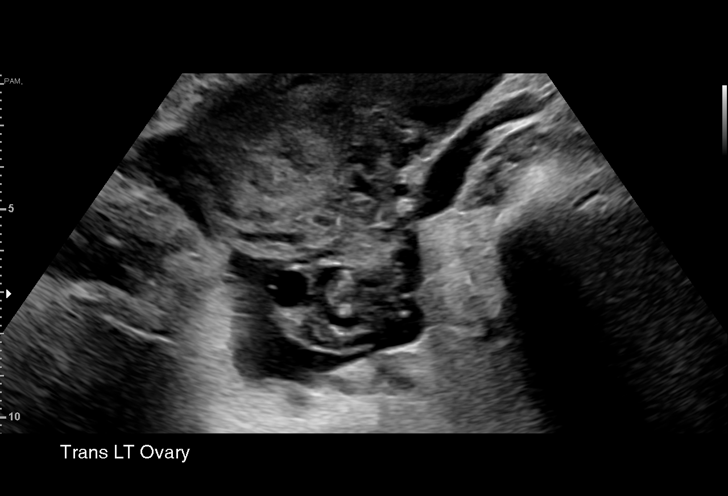
[im 15/28]
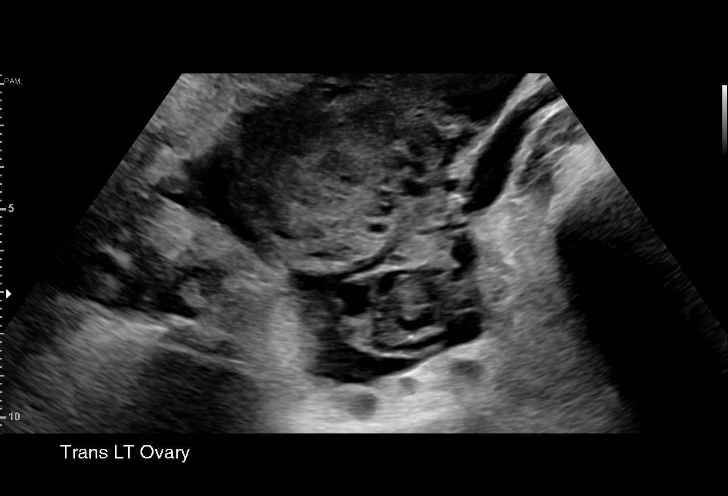
[im 16/28]
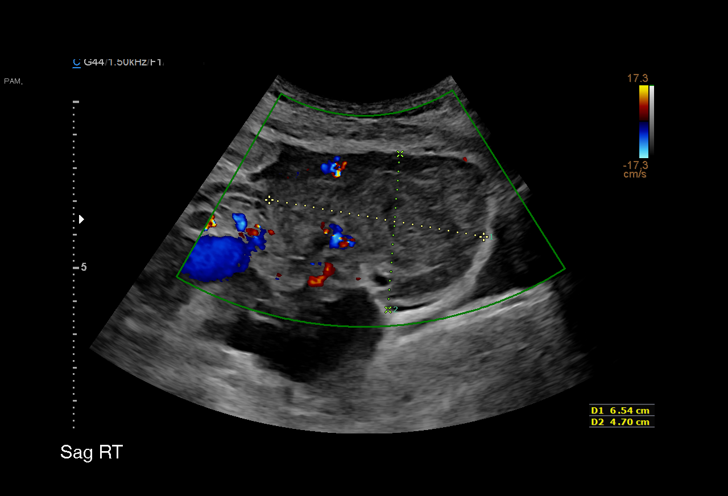
[im 18/28]
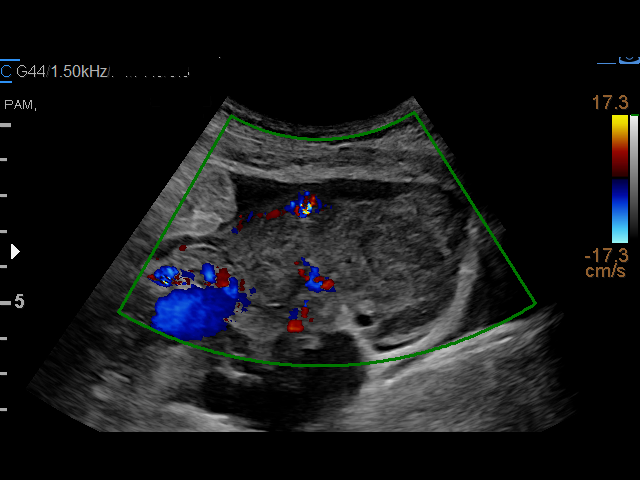
[im 20/28]
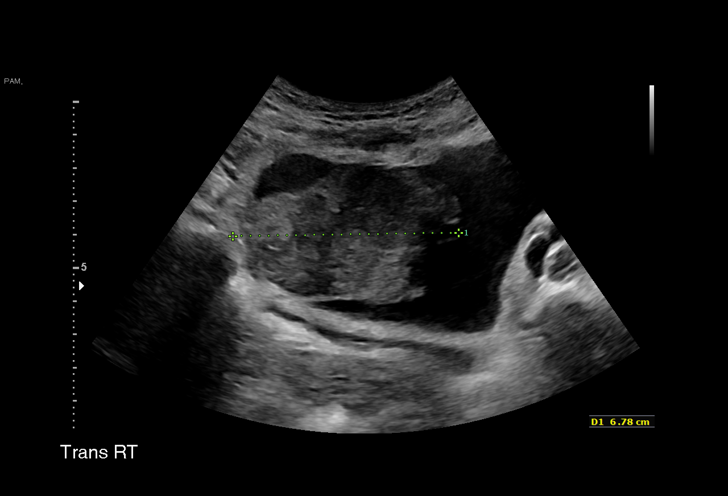
[im 22/28]
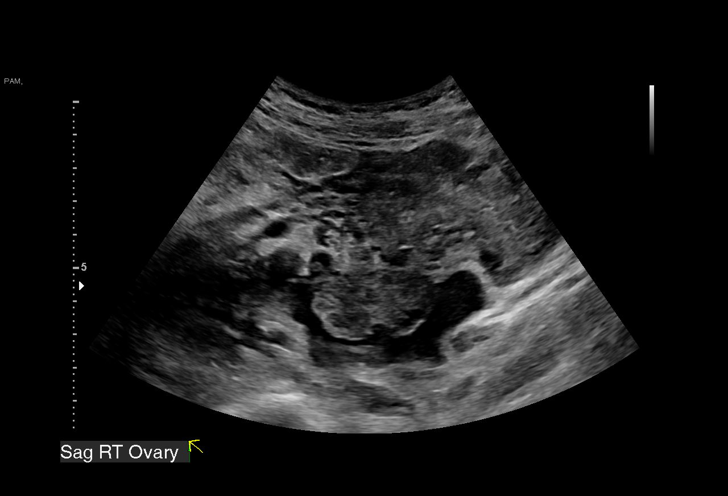
[im 24/28]
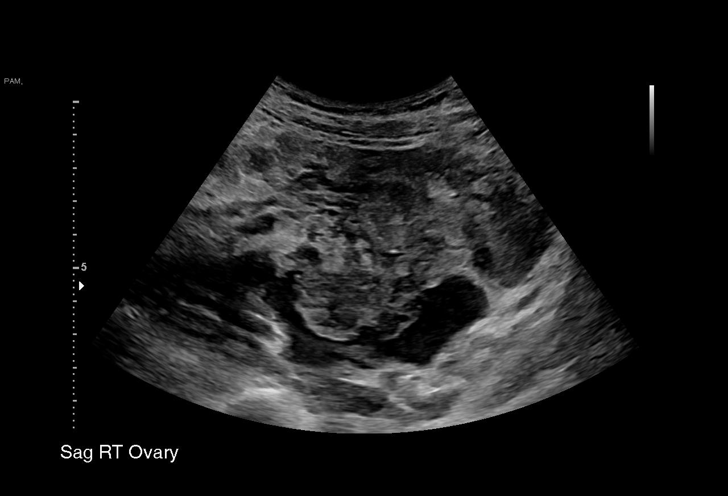
[im 26/28]
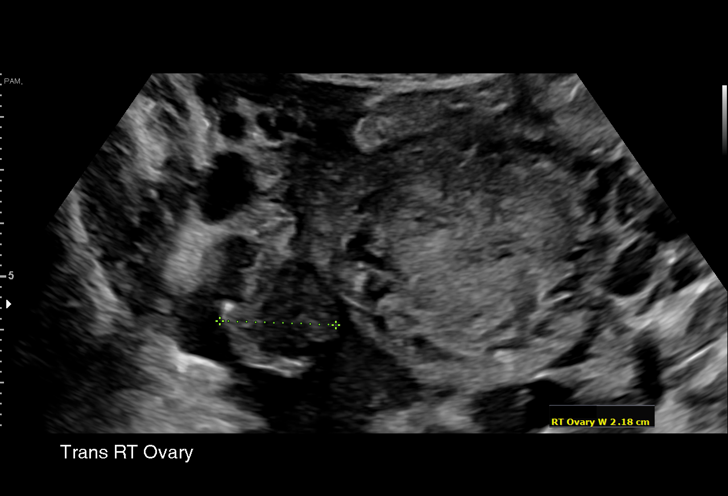
[im 28/28]
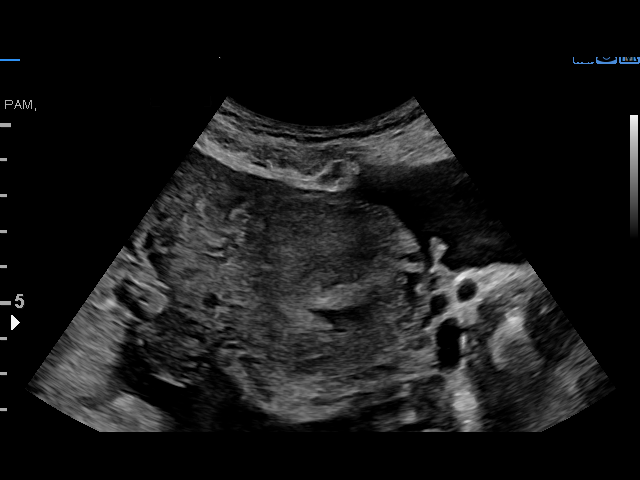

[15 of 28 positions shown; findings below may reference images not displayed]

FINDINGS: Intrauterine gestational sac: None

Maternal uterus/adnexae: A complex hypoechoic mass is seen in the
right adnexa which measures 6.5 x 4.7 x 6.8 cm. This appears
separate from the ovaries. A moderate amount of complex free fluid
is also seen.
IMPRESSION: No IUP visualized. 6.8 cm right adnexal mass with moderate amount of
free fluid, consistent with ruptured ectopic pregnancy.

Critical Value/emergent results were called by telephone at the time
of interpretation on 04/14/2017 at [DATE] to the patient's nurse in
[place], who verbally acknowledged these results.

## 2018-12-11 IMAGING — US US OB COMP LESS 14 WK
1 series · 15 of 28 positions shown · non-contrast
Comparison: Obstetric ultrasound 04/14/2017

CLINICAL DATA: Pregnant patient in first-trimester pregnancy with
abdominal pain. History of ruptured ectopic 2 months prior. No
menstrual periods since that time.

EXAM:
OBSTETRIC <14 WK US AND TRANSVAGINAL OB US
TECHNIQUE: Both transabdominal and transvaginal ultrasound examinations were
performed for complete evaluation of the gestation as well as the
maternal uterus, adnexal regions, and pelvic cul-de-sac.
Transvaginal technique was performed to assess early pregnancy.

[Series 1: us ob comp less 14 wk · 41 acquisitions, 15 frames shown]
[im 1/41]
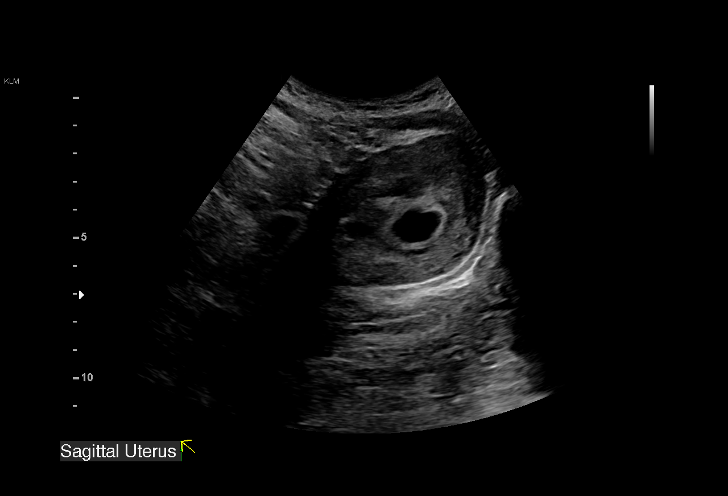
[im 3/41]
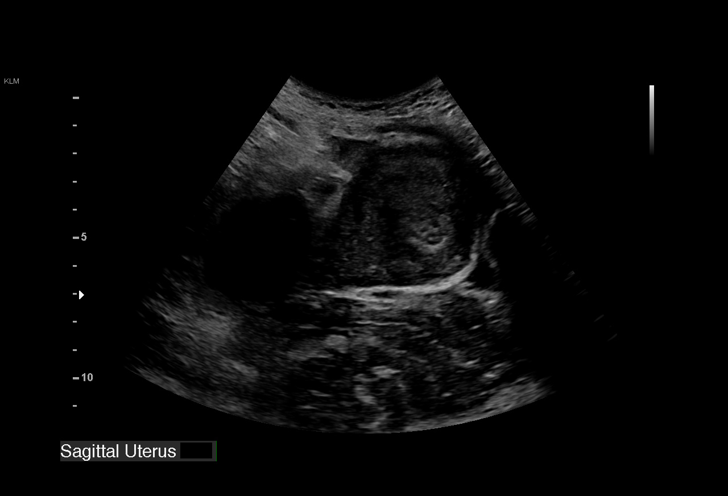
[im 6/41]
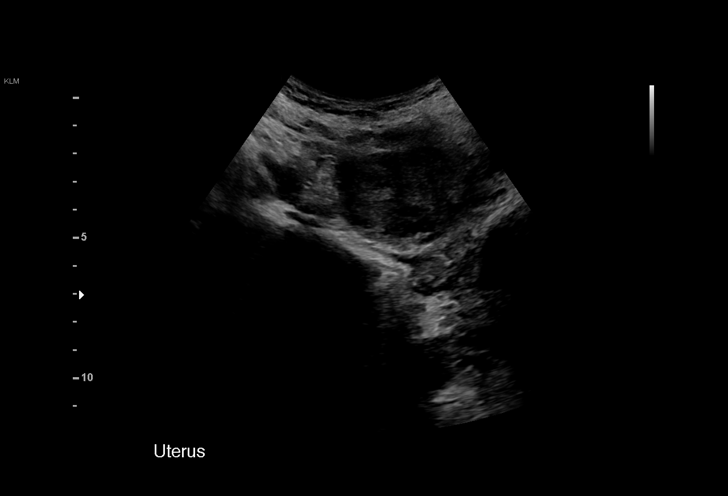
[im 9/41]
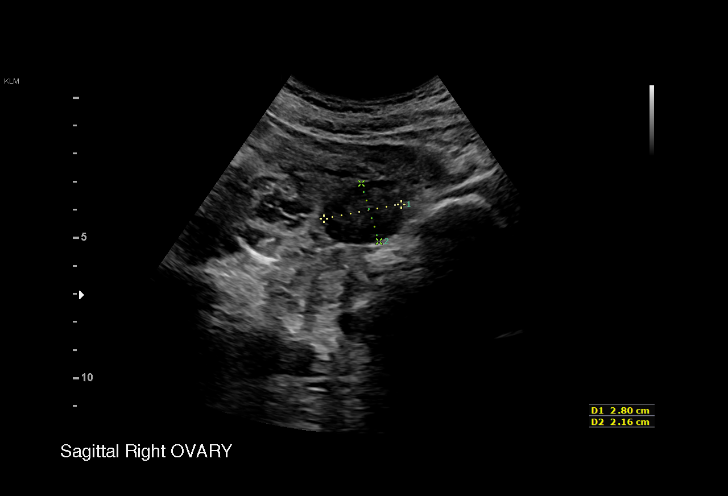
[im 12/41]
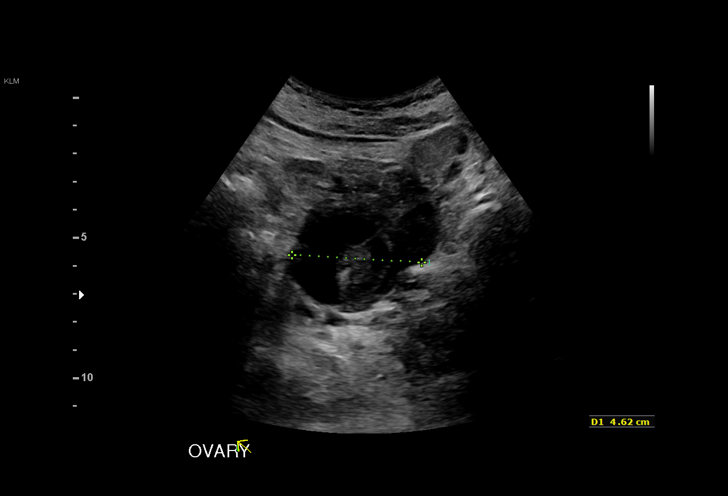
[im 15/41]
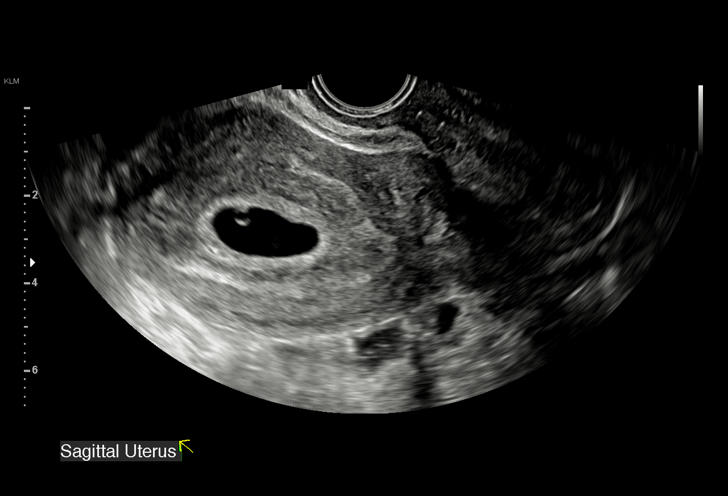
[im 18/41]
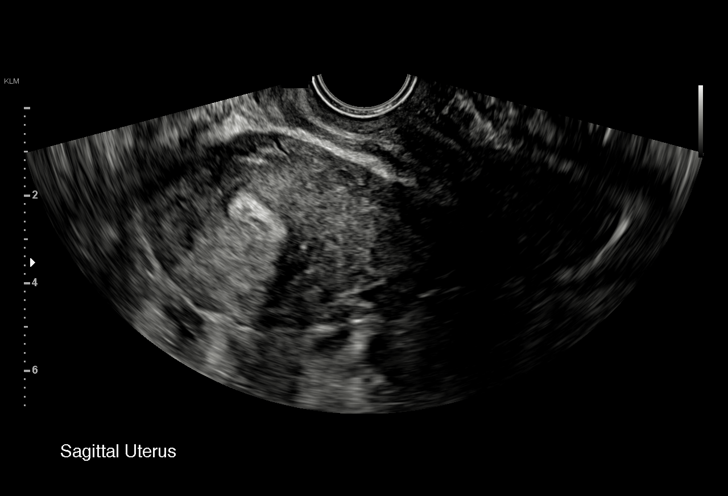
[im 21/41]
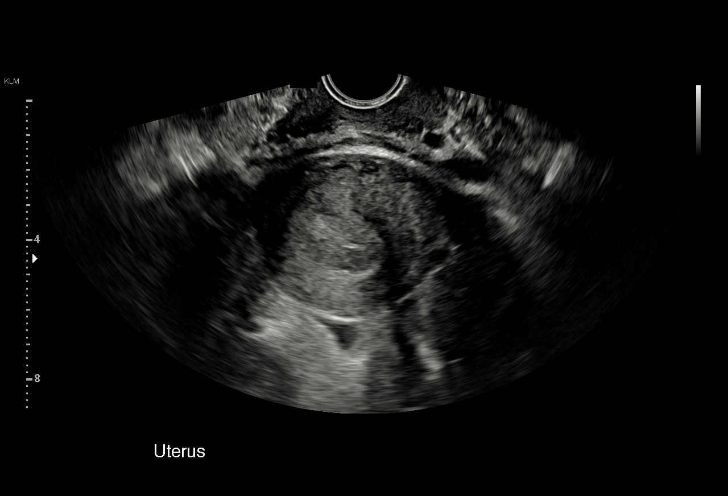
[im 23/41]
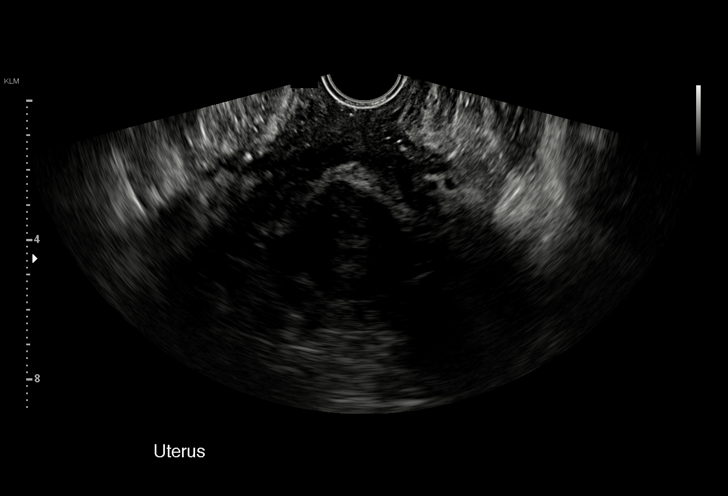
[im 26/41]
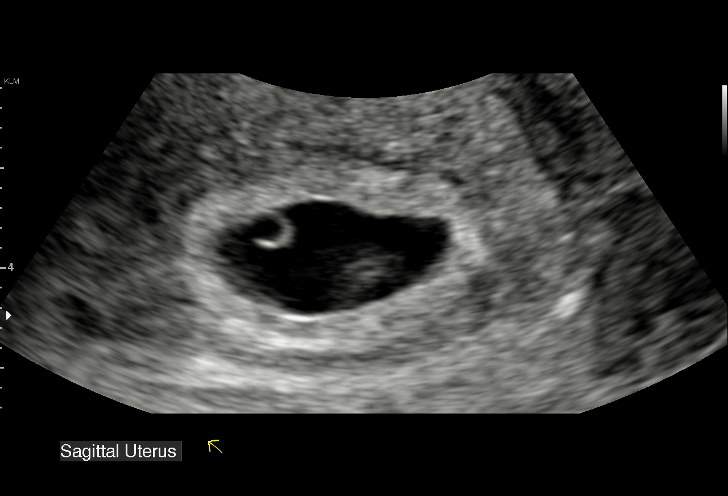
[im 29/41]
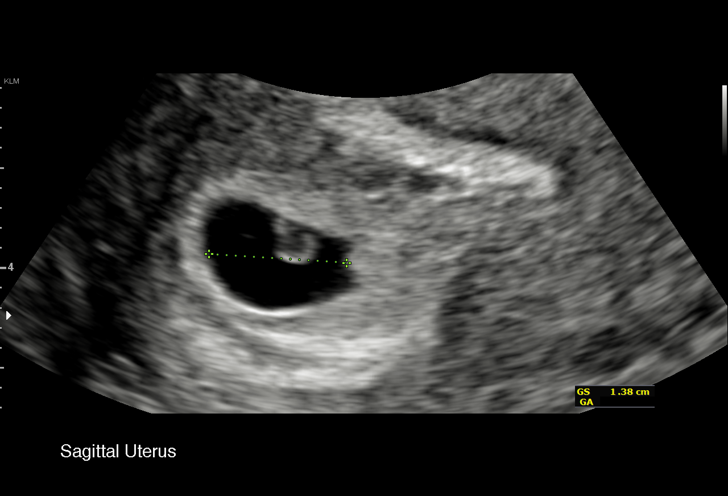
[im 32/41]
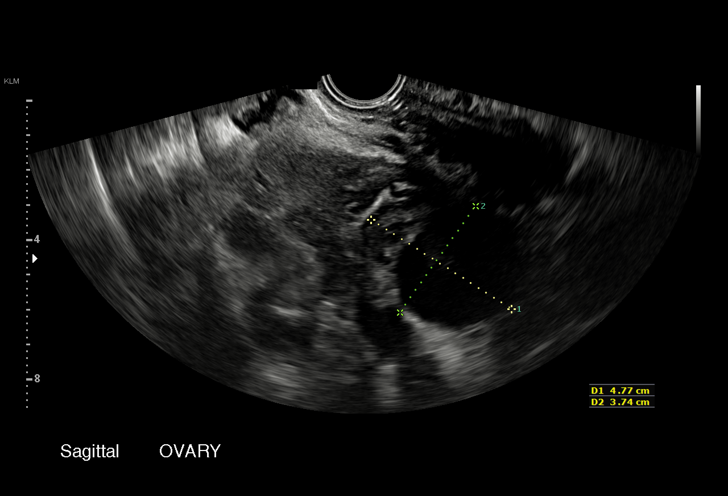
[im 35/41]
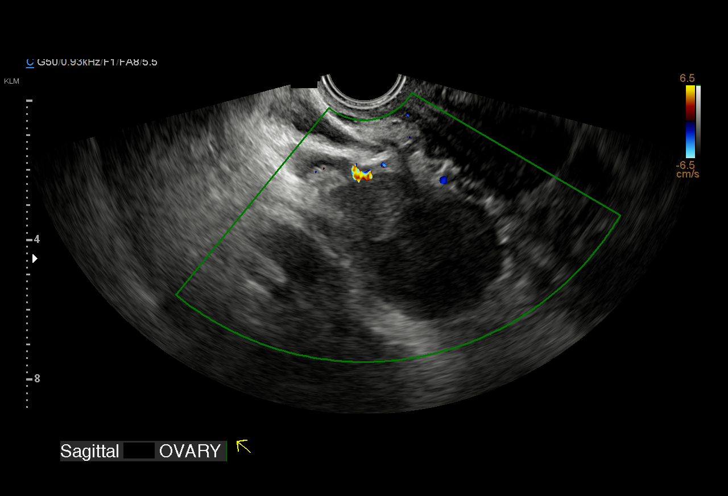
[im 38/41]
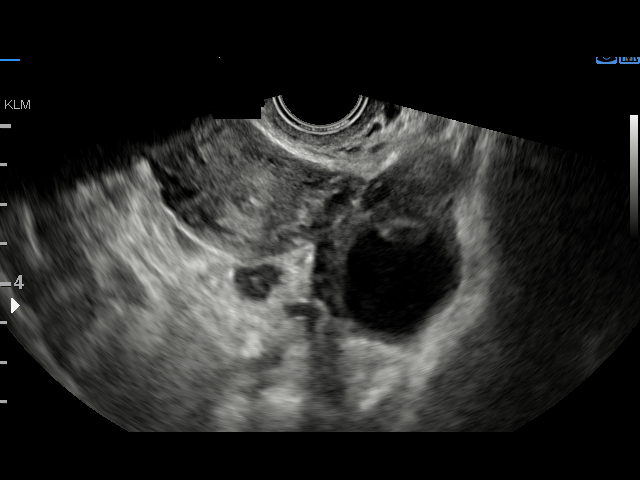
[im 41/41]
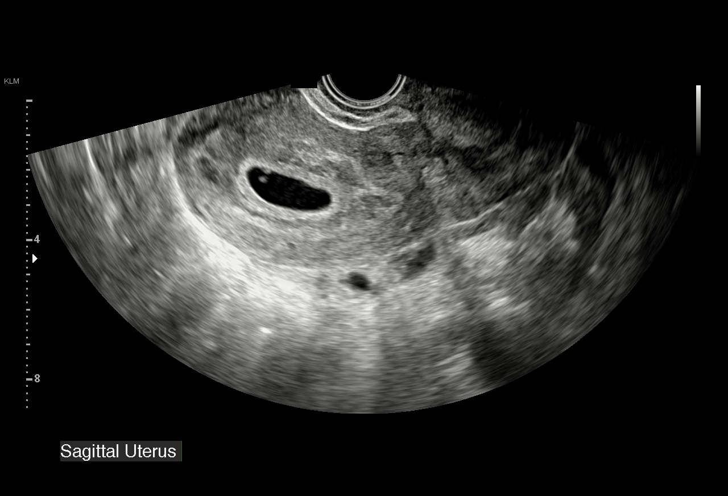

[15 of 28 positions shown; findings below may reference images not displayed]

FINDINGS: Intrauterine gestational sac: Single

Yolk sac:  Visualized.

Embryo:  Not Visualized.

Cardiac Activity: Not Visualized.

MSD: 16.6  mm   6 w   3  d

Subchorionic hemorrhage: None visualized. Small chorionic bump about
the inferior gestational sac.

Maternal uterus/adnexae: Left ovary measures 4.8 x 3.7 x 3.9 cm and
contains a corpus luteal cyst. Right ovary is normal measuring 3.9 x
2.2 x 2.6 cm. There is no pelvic free fluid.
IMPRESSION: 1. Intrauterine gestational sac containing a yolk sac, but no fetal
pole or cardiac activity yet visualized. Recommend follow-up
quantitative B-HCG levels and follow-up US in 10-14 days to assess
viability. This recommendation follows SRU consensus guidelines:
Diagnostic Criteria for Nonviable Pregnancy Early in the First
Trimester. N Engl J Med 4471; [DATE].
2. No subchorionic hemorrhage.
3. Chorionic bump noted, recommend attention on follow-up.

## 2018-12-17 ENCOUNTER — Other Ambulatory Visit: Payer: Self-pay

## 2018-12-17 ENCOUNTER — Encounter (HOSPITAL_COMMUNITY): Payer: Self-pay | Admitting: Emergency Medicine

## 2018-12-17 ENCOUNTER — Emergency Department (HOSPITAL_COMMUNITY)
Admission: EM | Admit: 2018-12-17 | Discharge: 2018-12-17 | Disposition: A | Discharge status: Home / Self Care | Payer: Medicaid Other | Attending: Emergency Medicine | Admitting: Emergency Medicine

## 2018-12-17 DIAGNOSIS — Y9389 Activity, other specified: Secondary | ICD-10-CM | POA: Insufficient documentation

## 2018-12-17 DIAGNOSIS — Y999 Unspecified external cause status: Secondary | ICD-10-CM | POA: Insufficient documentation

## 2018-12-17 DIAGNOSIS — N61 Mastitis without abscess: Secondary | ICD-10-CM | POA: Insufficient documentation

## 2018-12-17 DIAGNOSIS — Z87891 Personal history of nicotine dependence: Secondary | ICD-10-CM | POA: Diagnosis not present

## 2018-12-17 DIAGNOSIS — S21042A Puncture wound with foreign body of left breast, initial encounter: Secondary | ICD-10-CM | POA: Diagnosis not present

## 2018-12-17 DIAGNOSIS — W458XXA Other foreign body or object entering through skin, initial encounter: Secondary | ICD-10-CM | POA: Insufficient documentation

## 2018-12-17 DIAGNOSIS — S20152A Superficial foreign body of breast, left breast, initial encounter: Secondary | ICD-10-CM

## 2018-12-17 DIAGNOSIS — Y929 Unspecified place or not applicable: Secondary | ICD-10-CM | POA: Diagnosis not present

## 2018-12-17 DIAGNOSIS — Z79899 Other long term (current) drug therapy: Secondary | ICD-10-CM | POA: Insufficient documentation

## 2018-12-17 IMAGING — US US OB TRANSVAGINAL
1 series · 15 of 28 positions shown · non-contrast
Comparison: None.

CLINICAL DATA: Abnormal pregnancy, first trimester, chronic bowel

EXAM:
OBSTETRIC <14 WK US AND TRANSVAGINAL OB US
TECHNIQUE: Both transabdominal and transvaginal ultrasound examinations were
performed for complete evaluation of the gestation as well as the
maternal uterus, adnexal regions, and pelvic cul-de-sac.
Transvaginal technique was performed to assess early pregnancy.

[Series 1: us ob transvaginal · 15 of 44 slices shown]
[im 1/44]
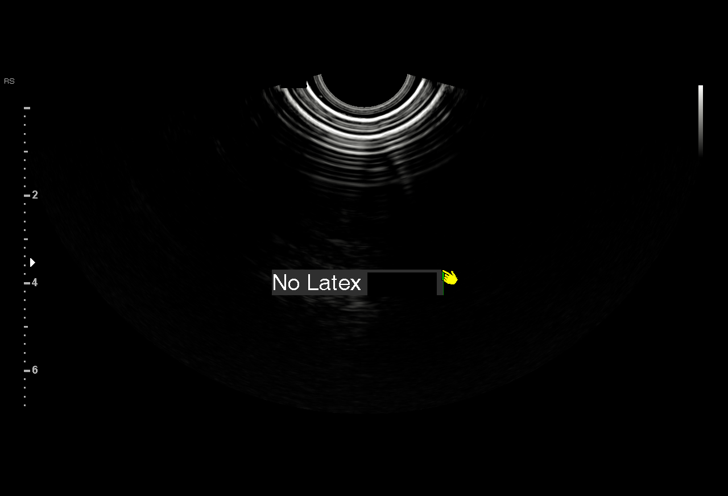
[im 4/44]
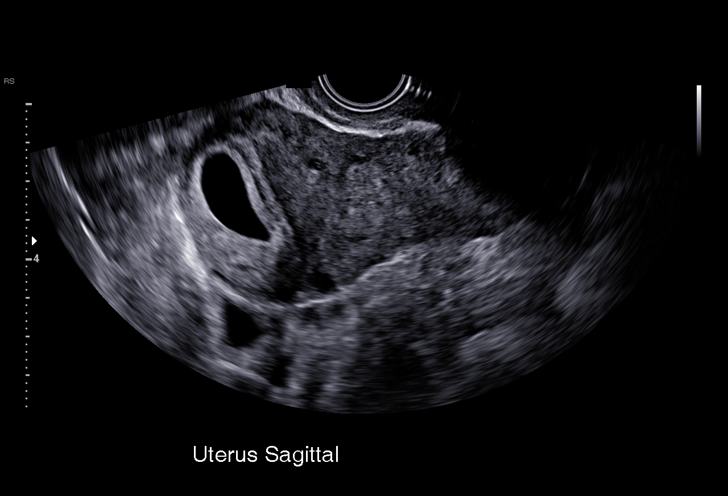
[im 7/44]
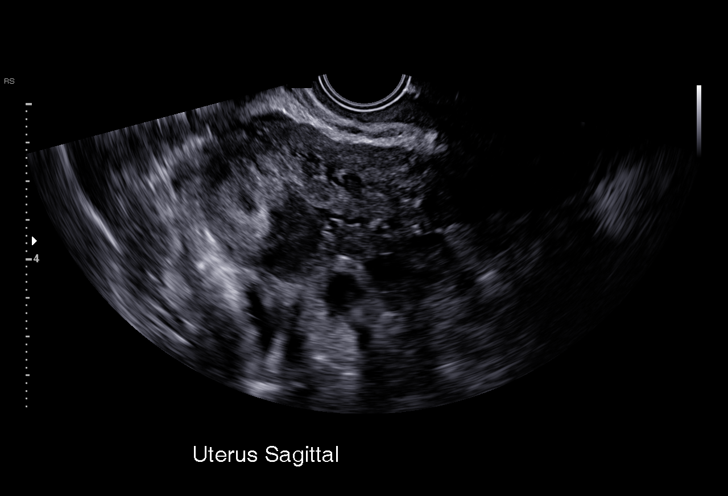
[im 10/44]
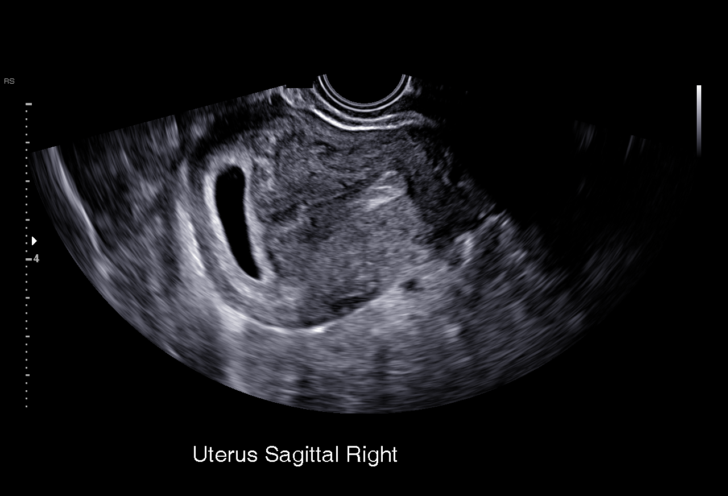
[im 13/44]
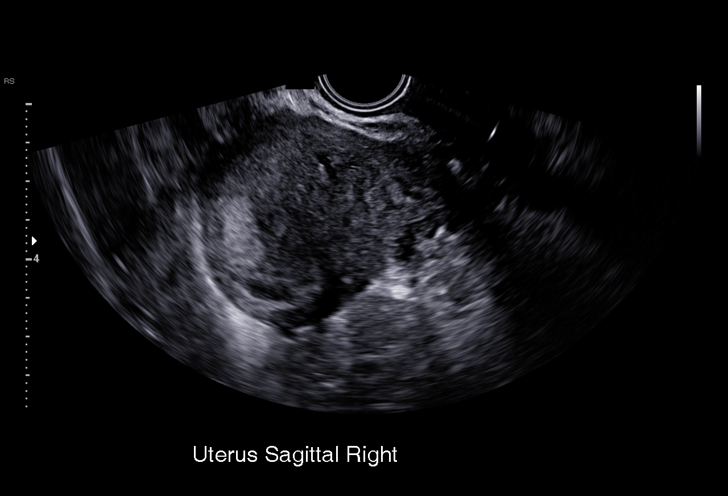
[im 16/44]
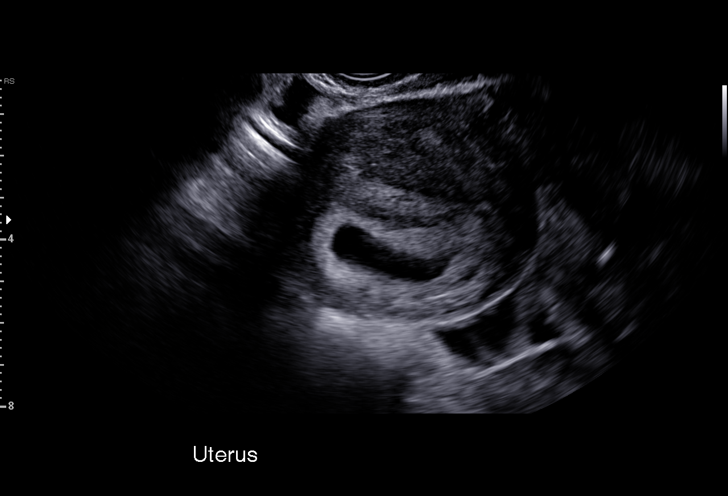
[im 20/44]
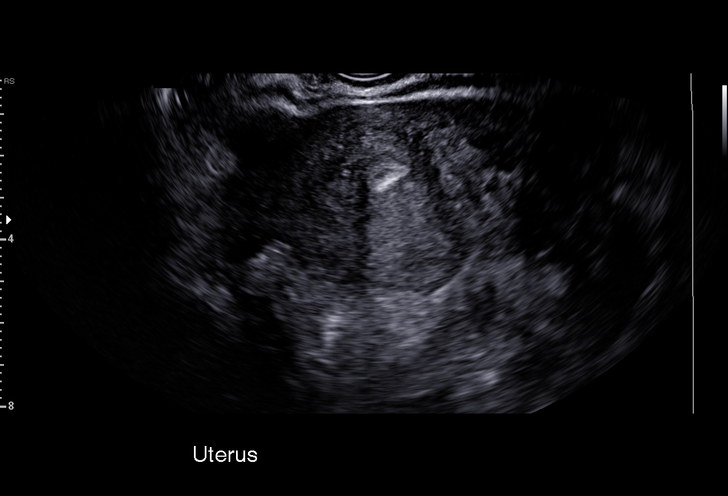
[im 23/44]
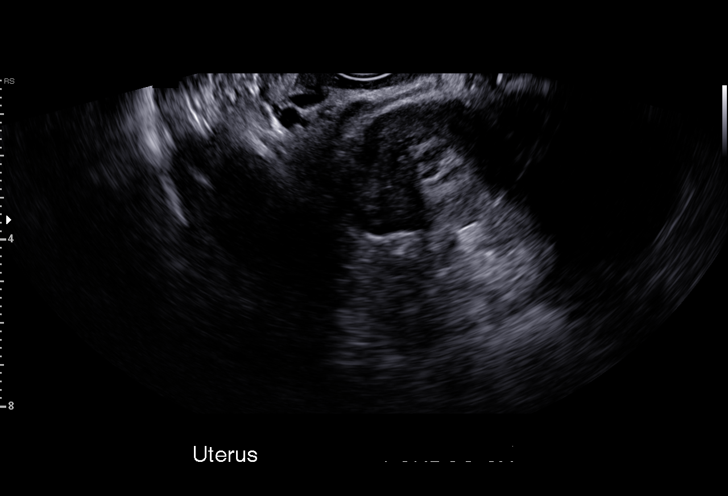
[im 24/44]
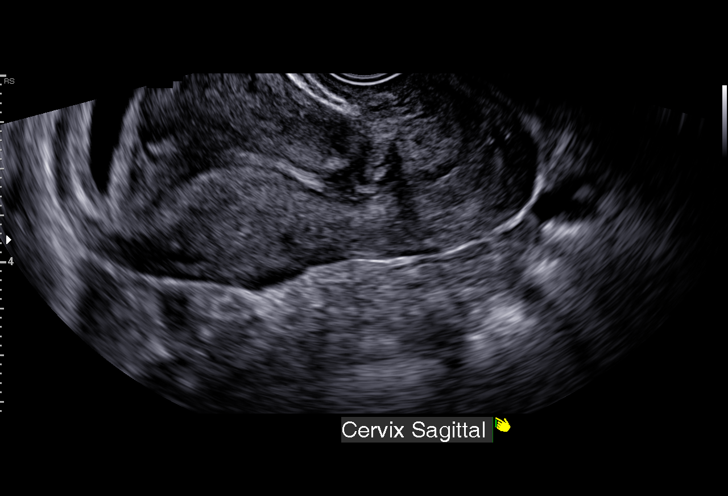
[im 28/44]
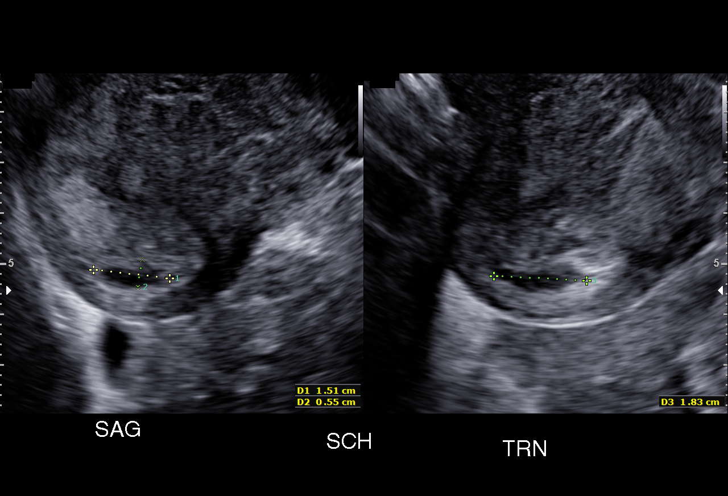
[im 31/44]
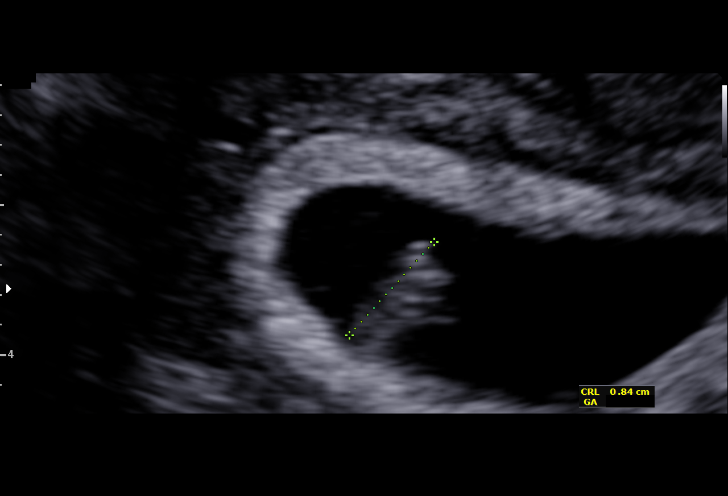
[im 34/44]
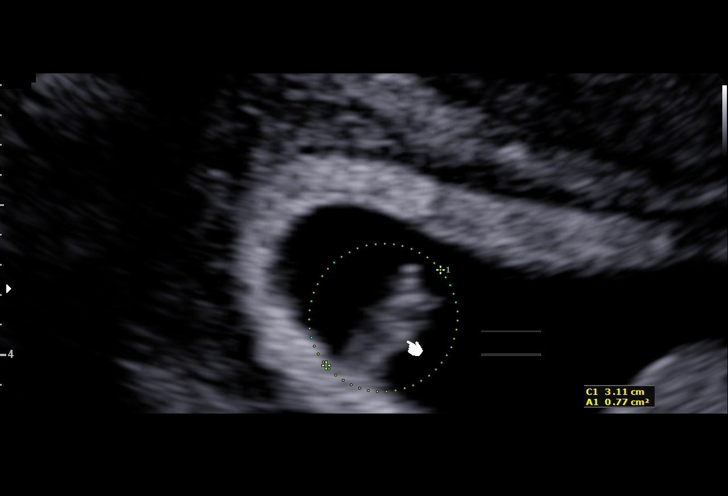
[im 37/44]
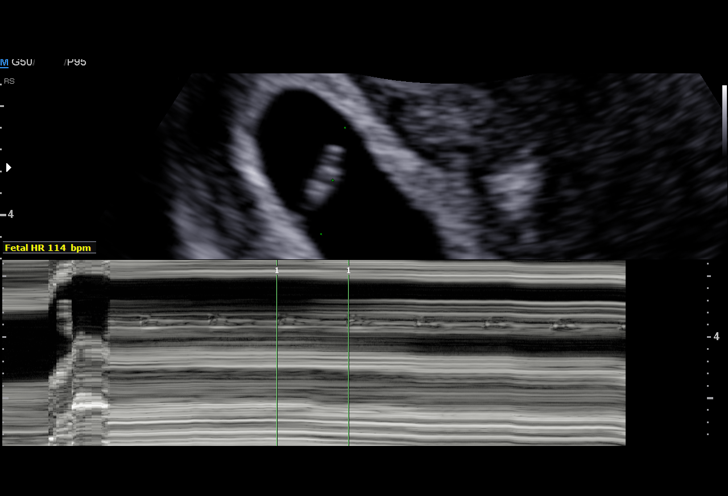
[im 40/44]
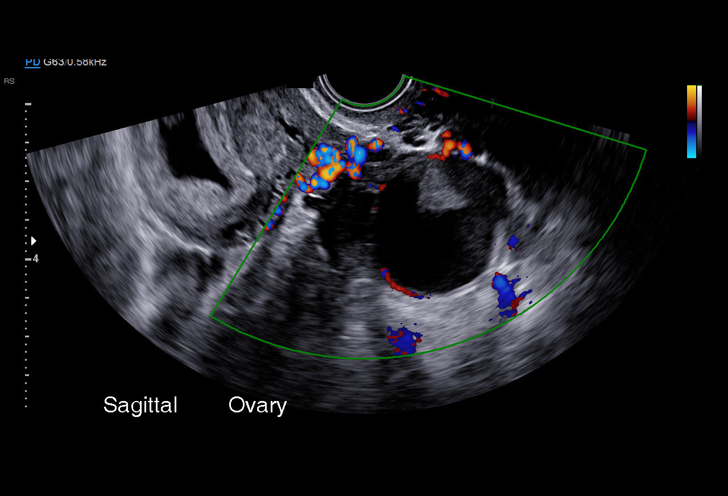
[im 44/44]
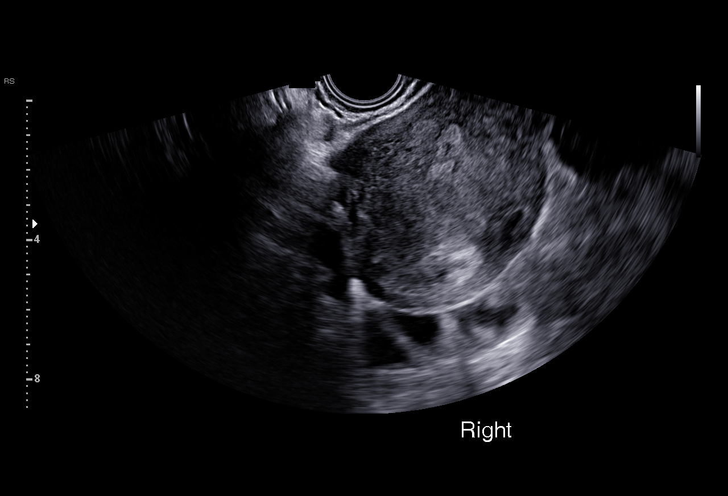

[15 of 28 positions shown; findings below may reference images not displayed]

FINDINGS: Intrauterine gestational sac: Present

Yolk sac:  Present

Embryo:  Present

Cardiac Activity: Present

Heart Rate: 114  bpm

CRL:  7.7  mm   6 w   6 d                  US EDC: 02/03/2018

Subchorionic hemorrhage:  Small subchronic hemorrhage 15 x 6 x 18 mm

Maternal uterus/adnexae:

Chorionic bump seen on the previous exam less prominent.

RIGHT ovary obscured by bowel loops.

Small hemorrhagic corpus luteal cyst within LEFT ovary with clot
retraction.

No free pelvic fluid or other adnexal masses.
IMPRESSION: Single live intrauterine gestation as above with small subchronic
hemorrhage.

Hemorrhagic corpus luteal cyst LEFT ovary with clot retraction.

Previously identified chorionic bump less prominent.

## 2018-12-17 MED ORDER — HYDROCODONE-ACETAMINOPHEN 5-325 MG PO TABS: 1.0000 | ORAL_TABLET | Freq: Four times a day (QID) | ORAL | 0 refills | Status: DC | PRN

## 2018-12-17 MED ORDER — CEPHALEXIN 500 MG PO CAPS: 500.0000 mg | ORAL_CAPSULE | Freq: Four times a day (QID) | ORAL | 0 refills | Status: DC

## 2018-12-17 MED ORDER — LIDOCAINE HCL 2 % IJ SOLN
5.0000 mL | Freq: Once | INTRAMUSCULAR | Status: AC
  Administered 2018-12-17: 100 mg
  Filled 2018-12-17: qty 20

## 2018-12-17 NOTE — Discharge Instructions (Signed)
It was my pleasure taking care of you today!   Please take all of your antibiotics until finished!   Clean area twice daily with soap and water.   Call breast center today to schedule a follow up appointment.   Return to ER for fever, new or worsening symptoms, any additional concerns.

## 2018-12-17 NOTE — ED Provider Notes (Signed)
[facility] EMERGENCY DEPARTMENT Provider Note   CSN: [id number] Arrival date & time: 12/17/18  1572     History   Chief Complaint Chief Complaint  Patient presents with  . Breast Problem    HPI Debra Sullivan is a 33 y.o. female.     The history is provided by the patient and medical records. No language interpreter was used.   Debra Sullivan is a 33 y.o. female  who presents to the Emergency Department complaining of and to the left breast.  Patient states that she got her nipple pierced 2 weeks ago.  3 days ago, one side which had a ball attached or went inside the nipple and she is unable to get it out.  The overlying skin she feels has closed now.  She started having some discharge from the nipple today, prompting her to come to the emergency department.  No fevers.  No medication or treatment prior to arrival for symptoms.   Past Medical History:  Diagnosis Date  . Anemia   . Drug abuse ([place])   . Psychosis ([place]) 2013    Patient Active Problem List   Diagnosis Date Noted  . Bipolar I disorder, most recent episode (or current) manic ([place]) 06/14/2018  . VBAC, delivered, current hospitalization 01/22/2018  . Supervision of high-risk pregnancy 01/21/2018  . LGA (large for gestational age) fetus affecting management of mother, third trimester, fetus 1 01/08/2018  . Anemia during pregnancy in third trimester 11/28/2017  . Drug dependence affecting pregnancy in third trimester   . Suspected fetal anomaly, antepartum   . Trichomonosis 08/01/2017  . Not immune to rubella 08/01/2017  . Supervision of other normal pregnancy, antepartum 07/11/2017  . History of cesarean section complicating pregnancy 62/08/5595  . Unwanted fertility 07/11/2017  . MDD (major depressive disorder), single episode, severe with psychosis ([place]) 04/15/2015  . Cannabis use disorder, severe, dependence ([place]) 04/15/2015  . PTSD (post-traumatic stress disorder) 04/15/2015  .  Cocaine use disorder, moderate, in early remission ([place]) 04/15/2015  . Psychosis ([place]) 04/14/2015    Past Surgical History:  Procedure Laterality Date  . CESAREAN SECTION  03/2016   pLTCS for twin B at [facility] N/A 04/14/2017   Procedure: LAPAROSCOPY OPERATIVE WITH RIGHT SALPINGECTOMY;  Surgeon: Sloan Leiter, MD;  Location: [facility];  Service: Gynecology;  Laterality: N/A;  . TUBAL LIGATION Bilateral 01/22/2018   Procedure: POST PARTUM TUBAL LIGATION;  Surgeon: Truett Mainland, DO;  Location: [facility];  Service: Gynecology;  Laterality: Bilateral;     OB History    Gravida  6   Para  5   Term  4   Preterm  1   AB  1   Living  6     SAB  0   TAB      Ectopic  1   Multiple  1   Live Births  6            Home Medications    Prior to Admission medications   Medication Sig Start Date End Date Taking? Authorizing Provider  benztropine (COGENTIN) 1 MG tablet Take 1 tablet (1 mg total) by mouth at bedtime. For prevention of drug induced tremors 06/17/18   Lindell Spar I, NP  cephALEXin (KEFLEX) 500 MG capsule Take 1 capsule (500 mg total) by mouth 4 (four) times daily. 12/17/18   Perry Brucato, Ozella Almond, PA-C  HYDROcodone-acetaminophen (NORCO) 5-325 MG tablet Take 1 tablet by mouth  every 6 (six) hours as needed for moderate pain. 12/17/18   Jalyah Weinheimer, Chase PicketJaime Pilcher, PA-C  hydrOXYzine (ATARAX/VISTARIL) 25 MG tablet Take 1 tablet (25 mg total) by mouth 3 (three) times daily as needed for anxiety. 06/17/18   Armandina StammerNwoko, Agnes I, NP  nicotine polacrilex (NICORETTE) 2 MG gum Take 1 each (2 mg total) by mouth as needed for smoking cessation. (May buy from over the counter): For smoking cessation 06/17/18   Armandina StammerNwoko, Agnes I, NP  risperiDONE (RISPERDAL) 4 MG tablet Take 1 tablet (4 mg total) by mouth at bedtime. For mood control 06/17/18   Armandina StammerNwoko, Agnes I, NP  traZODone (DESYREL) 50 MG tablet Take 1 tablet (50 mg total) by mouth at bedtime as needed for sleep. 06/17/18    Sanjuana KavaNwoko, Agnes I, NP    Family History Family History  Problem Relation Age of Onset  . Diabetes Mother   . Schizophrenia Mother   . Diabetes Brother   . Hypertension Maternal Aunt     Social History Social History   Tobacco Use  . Smoking status: Former Smoker    Packs/day: 0.25    Types: Cigarettes    Quit date: 10/03/2017    Years since quitting: 1.2  . Smokeless tobacco: Never Used  Substance Use Topics  . Alcohol use: No    Alcohol/week: 0.0 standard drinks  . Drug use: Yes    Types: Marijuana, Cocaine    Comment: Cocaine & Marijuana was used10/26/2018     Allergies   Naproxen   Review of Systems Review of Systems  Genitourinary:       + Breast pain  Skin: Positive for wound.     Physical Exam Updated Vital Signs BP 108/71 (BP Location: Right Arm)   Pulse 70   Temp 98.1 F (36.7 C) (Oral)   Resp 16   SpO2 99%   Physical Exam Vitals signs and nursing note reviewed.  Constitutional:      General: She is not in acute distress.    Appearance: She is well-developed.  HENT:     Head: Normocephalic and atraumatic.  Neck:     Musculoskeletal: Neck supple.  Cardiovascular:     Rate and Rhythm: Normal rate and regular rhythm.     Heart sounds: Normal heart sounds. No murmur.  Pulmonary:     Effort: Pulmonary effort is normal. No respiratory distress.     Breath sounds: Normal breath sounds. No wheezing or rales.  Chest:     Comments: Left breast with piercing. One side of jewelry coming out of medial aspect, ball of lateral side palpable under nipple. Small amount of discharge from nipple. No erythema or warmth.  Musculoskeletal: Normal range of motion.  Skin:    General: Skin is warm and dry.  Neurological:     Mental Status: She is alert.      ED Treatments / Results  Labs (all labs ordered are listed, but only abnormal results are displayed) Labs Reviewed - No data to display  EKG    Radiology No results found.  Procedures .Foreign  Body Removal  Date/Time: 12/17/2018 12:28 PM Performed by: Atlee Kluth, Chase PicketJaime Pilcher, PA-C Authorized by: Rees Santistevan, Chase PicketJaime Pilcher, PA-C  Consent: Verbal consent obtained. Risks and benefits: risks, benefits and alternatives were discussed Consent given by: patient Patient understanding: patient states understanding of the procedure being performed Patient identity confirmed: verbally with patient and arm band Time out: Immediately prior to procedure a "time out" was called to verify the correct patient, procedure,  equipment, support staff and site/side marked as required. Intake: Left breast. Anesthesia: local infiltration  Anesthesia: Local Anesthetic: lidocaine 2% without epinephrine Anesthetic total: 1.5 mL  Sedation: Patient sedated: no  Patient restrained: no Complexity: simple 1 objects recovered. Objects recovered: Nipple piecing jewelry Post-procedure assessment: foreign body removed Patient tolerance: patient tolerated the procedure well with no immediate complications Comments: Local infiltrate to area of nipple piercing which has started to close. Area was opened using 11 blade and jewelry pushed back through hole. Removed without difficulty.    (including critical care time)  Medications Ordered in ED Medications  lidocaine (XYLOCAINE) 2 % (with pres) injection 100 mg (100 mg Infiltration Given by Other 12/17/18 1032)     Initial Impression / Assessment and Plan / ED Course  I have reviewed the triage vital signs and the nursing notes.  Pertinent labs & imaging results that were available during my care of the patient were reviewed by me and considered in my medical decision making (see chart for details).       Debra Sullivan is a 33 y.o. female who presents to ED for evaluation of left breast pain.  She had her nipple pierced 2 weeks ago.  3 days ago, 1 side of the jewelry went under the skin and has not been able to be removed.  I can palpate the ball of the  jewelry, but the skin overlying has closed up.  She has some mild discharge concerning for infection of this area.  I did make a very small incision to open the piercing site up and push jewelry through.  The nipple piercing was removed without any complication.  Will start on antibiotics and have her follow-up with the breast center.  Discussed home wound care, follow-up plan and return precautions with patient.  All questions answered.  Patient discussed with Dr. Patria Maneampos who agrees with treatment plan.   Final Clinical Impressions(s) / ED Diagnoses   Final diagnoses:  Foreign body of breast, left, initial encounter  Breast infection    ED Discharge Orders         Ordered    cephALEXin (KEFLEX) 500 MG capsule  4 times daily     12/17/18 1056    HYDROcodone-acetaminophen (NORCO) 5-325 MG tablet  Every 6 hours PRN     12/17/18 1056           Wally Behan, Chase PicketJaime Pilcher, PA-C 12/17/18 1239    Azalia Bilisampos, Kevin, MD 12/17/18 1622

## 2018-12-17 NOTE — ED Triage Notes (Signed)
Pt arrives to ED from home with complaints of getting her nipples pierced two weeks ago and about three days ago the piercing in her left breast is now stuck and she cannot get it out.

## 2018-12-17 NOTE — ED Notes (Signed)
Patient verbalizes understanding of discharge instructions. Opportunity for questioning and answers were provided. Armband removed by staff, pt discharged from ED.  

## 2018-12-24 ENCOUNTER — Emergency Department (HOSPITAL_COMMUNITY)
Admission: EM | Admit: 2018-12-24 | Discharge: 2018-12-24 | Disposition: A | Discharge status: Home / Self Care | Payer: Medicaid Other | Attending: Emergency Medicine | Admitting: Emergency Medicine

## 2018-12-24 ENCOUNTER — Other Ambulatory Visit: Payer: Self-pay

## 2018-12-24 ENCOUNTER — Encounter (HOSPITAL_COMMUNITY): Payer: Self-pay | Admitting: Emergency Medicine

## 2018-12-24 DIAGNOSIS — Z202 Contact with and (suspected) exposure to infections with a predominantly sexual mode of transmission: Secondary | ICD-10-CM | POA: Diagnosis not present

## 2018-12-24 DIAGNOSIS — Z79891 Long term (current) use of opiate analgesic: Secondary | ICD-10-CM | POA: Insufficient documentation

## 2018-12-24 DIAGNOSIS — F319 Bipolar disorder, unspecified: Secondary | ICD-10-CM | POA: Diagnosis not present

## 2018-12-24 DIAGNOSIS — R102 Pelvic and perineal pain: Secondary | ICD-10-CM | POA: Insufficient documentation

## 2018-12-24 DIAGNOSIS — R350 Frequency of micturition: Secondary | ICD-10-CM | POA: Diagnosis not present

## 2018-12-24 LAB — URINALYSIS, ROUTINE W REFLEX MICROSCOPIC
Bilirubin Urine: NEGATIVE
Glucose, UA: NEGATIVE mg/dL
Hgb urine dipstick: NEGATIVE
Ketones, ur: NEGATIVE mg/dL
Leukocytes,Ua: NEGATIVE
Nitrite: NEGATIVE
Protein, ur: NEGATIVE mg/dL
Specific Gravity, Urine: 1.028 (ref 1.005–1.030)
pH: 7 (ref 5.0–8.0)

## 2018-12-24 LAB — WET PREP, GENITAL
Clue Cells Wet Prep HPF POC: NONE SEEN
Trich, Wet Prep: NONE SEEN
Yeast Wet Prep HPF POC: NONE SEEN

## 2018-12-24 MED ORDER — AZITHROMYCIN 250 MG PO TABS
1000.0000 mg | ORAL_TABLET | Freq: Once | ORAL | Status: AC
  Administered 2018-12-24: 1000 mg via ORAL
  Filled 2018-12-24: qty 4

## 2018-12-24 MED ORDER — CEFTRIAXONE SODIUM 250 MG IJ SOLR
250.0000 mg | Freq: Once | INTRAMUSCULAR | Status: AC
  Administered 2018-12-24: 250 mg via INTRAMUSCULAR
  Filled 2018-12-24: qty 250

## 2018-12-24 NOTE — ED Triage Notes (Signed)
Pt reports that she was possibly exposed to an STD 1 week ago because since this sexual encounter she has had lower pelvic pain, denies any vaginal discharge. Pt reports she was unable to make an appt at the health department to get tested.

## 2018-12-24 NOTE — ED Provider Notes (Signed)
[facility] Provider Note   CSN: [id number] Arrival date & time: 12/24/18  1247    History   Chief Complaint Chief Complaint  Patient presents with  . Exposure to STD    HPI Debra Sullivan is a 33 y.o. female.     HPI   33 year old female presents today with complaints of suprapubic pelvic pain.  She notes she had sex 2 weeks ago with a new partner at that time had concern for STD.  She notes minor suprapubic pain, she denies any vaginal discharge.  She notes increased urinary frequency but denies any dysuria or change in color or odor.  No upper abdominal pain nausea vomiting or fever.  She has a history of tubal ligation.    Past Medical History:  Diagnosis Date  . Anemia   . Drug abuse ([facility])   . Psychosis ([facility]) 2013    Patient Active Problem List   Diagnosis Date Noted  . Bipolar I disorder, most recent episode (or current) manic ([facility]) 06/14/2018  . VBAC, delivered, current hospitalization 01/22/2018  . Supervision of high-risk pregnancy 01/21/2018  . LGA (large for gestational age) fetus affecting management of mother, third trimester, fetus 1 01/08/2018  . Anemia during pregnancy in third trimester 11/28/2017  . Drug dependence affecting pregnancy in third trimester   . Suspected fetal anomaly, antepartum   . Trichomonosis 08/01/2017  . Not immune to rubella 08/01/2017  . Supervision of other normal pregnancy, antepartum 07/11/2017  . History of cesarean section complicating pregnancy 07/11/2017  . Unwanted fertility 07/11/2017  . 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 Cannabis use disorder, severe, dependence ([facility]) 04/15/2015  . PTSD (post-traumatic stress disorder) 04/15/2015  . Cocaine use disorder, moderate, in early remission ([facility]) 04/15/2015  . Psychosis ([facility]) 04/14/2015    Past Surgical History:  Procedure Laterality Date  . CESAREAN SECTION  03/2016   [facility]  . LAPAROSCOPY N/A 04/14/2017   Procedure: LAPAROSCOPY OPERATIVE WITH RIGHT SALPINGECTOMY;  Surgeon: Conan Bowensavis, Kelly M, MD;  Location: [facility];  Service: Gynecology;  Laterality: N/A;  . TUBAL LIGATION Bilateral 01/22/2018   Procedure: POST PARTUM TUBAL LIGATION;  Surgeon: Levie HeritageStinson, Jacob J, DO;  Location: [facility];  Service: Gynecology;  Laterality: Bilateral;     OB History    Gravida  6   Para  5   Term  4   Preterm  1   AB  1   Living  6     SAB  0   TAB      Ectopic  1   Multiple  1   Live Births  6            Home Medications    Prior to Admission medications   Not on File    Family History Family History  Problem Relation Age of Onset  . Diabetes Mother   . Schizophrenia Mother   . Diabetes Brother   . Hypertension Maternal Aunt     Social History Social History   Tobacco Use  . Smoking status: Former Smoker    Packs/day: 0.25    Types: Cigarettes    Quit date: 10/03/2017    Years since quitting: 1.2  . Smokeless tobacco: Never Used  Substance Use Topics  . Alcohol use: No    Alcohol/week: 0.0 standard drinks  . Drug use: Yes    Types: Marijuana, Cocaine  Comment: Cocaine & Marijuana was used10/26/2018     Allergies   Naproxen   Review of Systems Review of Systems  All other systems reviewed and are negative.    Physical Exam Updated Vital Signs BP 102/60 (BP Location: Right Arm)   Pulse 60   Temp 98.2 F (36.8 C) (Oral)   Resp 16   SpO2 99%   Physical Exam Vitals signs and nursing note reviewed.  Constitutional:      Appearance: She is well-developed.  HENT:     Head: Normocephalic and atraumatic.  Eyes:     General: No scleral icterus.       Right eye: No discharge.        Left eye: No discharge.     Conjunctiva/sclera: Conjunctivae normal.     Pupils: Pupils are equal, round, and reactive to light.  Neck:     Musculoskeletal: Normal range of motion.     Vascular: No  JVD.     Trachea: No tracheal deviation.  Pulmonary:     Effort: Pulmonary effort is normal.     Breath sounds: No stridor.  Abdominal:     General: There is no distension.     Palpations: Abdomen is soft.     Tenderness: There is no abdominal tenderness.  Genitourinary:    Comments: Small amount of white vaginal discharge, no cervical motion tenderness adnexal tenderness or masses Neurological:     Mental Status: She is alert and oriented to person, place, and time.     Coordination: Coordination normal.  Psychiatric:        Behavior: Behavior normal.        Thought Content: Thought content normal.        Judgment: Judgment normal.      ED Treatments / Results  Labs (all labs ordered are listed, but only abnormal results are displayed) Labs Reviewed  WET PREP, GENITAL - Abnormal; Notable for the following components:      Result Value   WBC, Wet Prep HPF POC MODERATE (*)    All other components within normal limits  URINALYSIS, ROUTINE W REFLEX MICROSCOPIC - Abnormal; Notable for the following components:   APPearance CLOUDY (*)    All other components within normal limits  POC URINE PREG, ED  GC/CHLAMYDIA PROBE AMP ([place]) NOT AT [facility]    EKG None  Radiology No results found.  Procedures Procedures (including critical care time)  Medications Ordered in ED Medications  cefTRIAXone (ROCEPHIN) injection 250 mg (250 mg Intramuscular Given 12/24/18 1643)  azithromycin (ZITHROMAX) tablet 1,000 mg (1,000 mg Oral Given 12/24/18 1643)     Initial Impression / Assessment and Plan / ED Course  I have reviewed the triage vital signs and the nursing notes.  Pertinent labs & imaging results that were available during my care of the patient were reviewed by me and considered in my medical decision making (see chart for details).        Patient here with concern for STD.  She was treated prophylactically, she has tubal ligation low suspicion for pregnancy.  Discharged  with outpatient follow-up and strict return precautions.  She verbalized understanding and agreement to today's plan had no further questions concerns at time of discharge.  Final Clinical Impressions(s) / ED Diagnoses   Final diagnoses:  STD exposure    ED Discharge Orders    None       Francee Gentile 12/24/18 1719    Blanchie Dessert, MD 12/27/18 2118

## 2018-12-24 NOTE — Discharge Instructions (Addendum)
Please read attached information. If you experience any new or worsening signs or symptoms please return to the emergency room for evaluation. Please follow-up with your primary care provider or specialist as discussed.  °

## 2018-12-25 LAB — GC/CHLAMYDIA PROBE AMP (~~LOC~~) NOT AT ARMC
Chlamydia: POSITIVE — AB
Neisseria Gonorrhea: NEGATIVE

## 2019-02-10 ENCOUNTER — Encounter: Payer: Self-pay | Admitting: Emergency Medicine

## 2019-02-10 ENCOUNTER — Emergency Department (HOSPITAL_COMMUNITY)
Admission: EM | Admit: 2019-02-10 | Discharge: 2019-02-10 | Disposition: A | Discharge status: Home / Self Care | Payer: Medicaid Other | Attending: Emergency Medicine | Admitting: Emergency Medicine

## 2019-02-10 DIAGNOSIS — W2209XA Striking against other stationary object, initial encounter: Secondary | ICD-10-CM | POA: Insufficient documentation

## 2019-02-10 DIAGNOSIS — Y939 Activity, unspecified: Secondary | ICD-10-CM | POA: Insufficient documentation

## 2019-02-10 DIAGNOSIS — M542 Cervicalgia: Secondary | ICD-10-CM | POA: Diagnosis not present

## 2019-02-10 DIAGNOSIS — Y999 Unspecified external cause status: Secondary | ICD-10-CM | POA: Insufficient documentation

## 2019-02-10 DIAGNOSIS — Z87891 Personal history of nicotine dependence: Secondary | ICD-10-CM | POA: Diagnosis not present

## 2019-02-10 DIAGNOSIS — F319 Bipolar disorder, unspecified: Secondary | ICD-10-CM | POA: Insufficient documentation

## 2019-02-10 DIAGNOSIS — M546 Pain in thoracic spine: Secondary | ICD-10-CM | POA: Insufficient documentation

## 2019-02-10 DIAGNOSIS — Y92481 Parking lot as the place of occurrence of the external cause: Secondary | ICD-10-CM | POA: Diagnosis not present

## 2019-02-10 DIAGNOSIS — F4310 Post-traumatic stress disorder, unspecified: Secondary | ICD-10-CM | POA: Diagnosis not present

## 2019-02-10 DIAGNOSIS — R040 Epistaxis: Secondary | ICD-10-CM | POA: Diagnosis present

## 2019-02-10 MED ORDER — ACETAMINOPHEN 325 MG PO TABS
650.0000 mg | ORAL_TABLET | Freq: Once | ORAL | Status: AC
  Administered 2019-02-10: 650 mg via ORAL
  Filled 2019-02-10: qty 2

## 2019-02-10 MED ORDER — CYCLOBENZAPRINE HCL 5 MG PO TABS: 5.0000 mg | ORAL_TABLET | Freq: Two times a day (BID) | ORAL | 0 refills | Status: DC | PRN

## 2019-02-10 NOTE — ED Notes (Signed)
Pt verbalizes understanding of d/c instructions. Prescriptions reviewed with patient. Pt ambulatory at d/c with all belongings.  
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
## 2019-02-10 NOTE — Discharge Instructions (Addendum)
Please read attached information. If you experience any new or worsening signs or symptoms please return to the emergency room for evaluation. Please follow-up with your primary care provider or specialist as discussed. Please use medication prescribed only as directed and discontinue taking if you have any concerning signs or symptoms.   °

## 2019-07-15 ENCOUNTER — Encounter (HOSPITAL_COMMUNITY): Payer: Self-pay | Admitting: Emergency Medicine

## 2019-07-15 ENCOUNTER — Ambulatory Visit (HOSPITAL_COMMUNITY)
Admission: EM | Admit: 2019-07-15 | Discharge: 2019-07-15 | Disposition: A | Discharge status: Home / Self Care | Payer: Medicaid Other | Attending: Family Medicine | Admitting: Family Medicine

## 2019-07-15 ENCOUNTER — Other Ambulatory Visit: Payer: Self-pay

## 2019-07-15 DIAGNOSIS — M79632 Pain in left forearm: Secondary | ICD-10-CM | POA: Diagnosis not present

## 2019-07-15 DIAGNOSIS — M25562 Pain in left knee: Secondary | ICD-10-CM | POA: Diagnosis not present

## 2019-07-15 MED ORDER — CYCLOBENZAPRINE HCL 10 MG PO TABS: ORAL_TABLET | ORAL | 0 refills | Status: DC

## 2019-07-15 MED ORDER — IBUPROFEN 800 MG PO TABS: 800.0000 mg | ORAL_TABLET | Freq: Three times a day (TID) | ORAL | 0 refills | Status: DC
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
## 2019-07-24 ENCOUNTER — Ambulatory Visit (HOSPITAL_COMMUNITY)
Admission: EM | Admit: 2019-07-24 | Discharge: 2019-07-24 | Discharge status: Against Medical Advice | Payer: Medicaid Other

## 2019-07-24 ENCOUNTER — Other Ambulatory Visit: Payer: Self-pay

## 2019-07-25 ENCOUNTER — Other Ambulatory Visit: Payer: Self-pay

## 2019-07-25 ENCOUNTER — Ambulatory Visit (HOSPITAL_COMMUNITY)
Admission: EM | Admit: 2019-07-25 | Discharge: 2019-07-25 | Disposition: A | Discharge status: Home / Self Care | Payer: Medicaid Other | Attending: Family Medicine | Admitting: Family Medicine

## 2019-07-25 ENCOUNTER — Encounter (HOSPITAL_COMMUNITY): Payer: Self-pay

## 2019-07-25 DIAGNOSIS — N760 Acute vaginitis: Secondary | ICD-10-CM | POA: Diagnosis present

## 2019-07-25 DIAGNOSIS — Z202 Contact with and (suspected) exposure to infections with a predominantly sexual mode of transmission: Secondary | ICD-10-CM | POA: Diagnosis present

## 2019-07-25 MED ORDER — METRONIDAZOLE 500 MG PO TABS: 500.0000 mg | ORAL_TABLET | Freq: Two times a day (BID) | ORAL | 0 refills | Status: DC

## 2019-07-25 MED ORDER — FLUCONAZOLE 150 MG PO TABS: 150.0000 mg | ORAL_TABLET | Freq: Every day | ORAL | 0 refills | Status: DC

## 2019-07-25 NOTE — ED Triage Notes (Signed)
Patient presents to Urgent Care with complaints of itchy vaginal discharge since last week. Patient reports she has not tried anything otc for it.

## 2019-07-25 NOTE — ED Provider Notes (Signed)
[facility]    CSN: [id number] Arrival date & time: 07/25/19  7628      History   Chief Complaint Chief Complaint  Patient presents with  . Vaginal Discharge    HPI Debra Sullivan is a 34 y.o. female.   HPI  Patient is here for vaginal itching.  Scant discharge.  No rash or lesions.  No history of HSV. She has had unprotected sexual relations.  She would like testing for STDs. She is certain she is not pregnant.  She started to diet.  She has 6 children at home No abdominal pain.  No fever chills.  No rash  Past Medical History:  Diagnosis Date  . Anemia   . Drug abuse ([facility])   . Psychosis ([facility]) 2013    Patient Active Problem List   Diagnosis Date Noted  . Bipolar I disorder, most recent episode (or current) manic ([facility]) 06/14/2018  . VBAC, delivered, current hospitalization 01/22/2018  . Supervision of high-risk pregnancy 01/21/2018  . LGA (large for gestational age) fetus affecting management of mother, third trimester, fetus 1 01/08/2018  . Anemia during pregnancy in third trimester 11/28/2017  . Drug dependence affecting pregnancy in third trimester   . Suspected fetal anomaly, antepartum   . Trichomonosis 08/01/2017  . Not immune to rubella 08/01/2017  . Supervision of other normal pregnancy, antepartum 07/11/2017  . History of cesarean section complicating pregnancy 07/11/2017  . Unwanted fertility 07/11/2017  . 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 Cannabis use disorder, severe, dependence ([facility]) 04/15/2015  . PTSD (post-traumatic stress disorder) 04/15/2015  . Cocaine use disorder, moderate, in early remission ([facility]) 04/15/2015  . Psychosis ([facility]) 04/14/2015    Past Surgical History:  Procedure Laterality Date  . CESAREAN SECTION  03/2016   pLTCS for twin B at [facility]  . LAPAROSCOPY N/A 04/14/2017   Procedure: LAPAROSCOPY OPERATIVE WITH RIGHT SALPINGECTOMY;  Surgeon: Conan Bowens, MD;   Location: [facility];  Service: Gynecology;  Laterality: N/A;  . TUBAL LIGATION Bilateral 01/22/2018   Procedure: POST PARTUM TUBAL LIGATION;  Surgeon: [name], DO;  [facility];  Service: Gynecology;  Laterality: Bilateral;    OB History    Gravida  6   Para  5   Term  4   Preterm  1   AB  1   Living  6     SAB  0   TAB      Ectopic  1   Multiple  1   Live Births  6            Home Medications    Prior to Admission medications   Medication Sig Start Date End Date Taking? Authorizing Provider  fluconazole (DIFLUCAN) 150 MG tablet Take 1 tablet (150 mg total) by mouth daily. Repeat in 1 week if needed 07/25/19   Eustace Moore, MD  ibuprofen (ADVIL) 800 MG tablet Take 1 tablet (800 mg total) by mouth 3 (three) times daily. 07/15/19   Mardella Layman, MD  metroNIDAZOLE (FLAGYL) 500 MG tablet Take 1 tablet (500 mg total) by mouth 2 (two) times daily. 07/25/19   Eustace Moore, MD    Family History Family History  Problem Relation Age of Onset  . Diabetes Mother   . Schizophrenia Mother   . Diabetes Brother   . Hypertension Maternal Aunt   . Healthy Father     Social History Social History  Tobacco Use  . Smoking status: Current Every Day Smoker    Packs/day: 0.30    Types: Cigarettes  . Smokeless tobacco: Never Used  Substance Use Topics  . Alcohol use: Yes    Alcohol/week: 0.0 standard drinks    Comment: socially  . Drug use: Yes    Types: Marijuana, Cocaine    Comment: Cocaine & Marijuana was used10/26/2018     Allergies   Naproxen   Review of Systems Review of Systems  Constitutional: Negative for chills and fever.  Gastrointestinal: Negative for abdominal pain.  Genitourinary: Positive for vaginal discharge.  Skin: Negative for rash.     Physical Exam Triage Vital Signs ED Triage Vitals  Enc Vitals Group     BP 07/25/19 0849 (!) 94/54     Pulse Rate 07/25/19 0849 87     Resp 07/25/19 0849 16     Temp  07/25/19 0849 98.1 F (36.7 C)     Temp Source 07/25/19 0849 Oral     SpO2 07/25/19 0849 98 %     Weight --      Height --      Head Circumference --      Peak Flow --      Pain Score 07/25/19 0847 0     Pain Loc --      Pain Edu? --      Excl. in GC? --    No data found.  Updated Vital Signs BP (!) 94/54 (BP Location: Left Arm)   Pulse 87   Temp 98.1 F (36.7 C) (Oral)   Resp 16   SpO2 98%     Physical Exam Constitutional:      General: She is not in acute distress.    Appearance: Normal appearance. She is well-developed and normal weight.  HENT:     Head: Normocephalic and atraumatic.     Mouth/Throat:     Comments: Mask in place Eyes:     Conjunctiva/sclera: Conjunctivae normal.     Pupils: Pupils are equal, round, and reactive to light.  Cardiovascular:     Rate and Rhythm: Normal rate.  Pulmonary:     Effort: Pulmonary effort is normal. No respiratory distress.  Abdominal:     General: Abdomen is flat. There is no distension.     Palpations: Abdomen is soft.     Tenderness: There is no abdominal tenderness.  Genitourinary:    General: Normal vulva.     Vagina: Vaginal discharge present.     Comments: Scant white vaginal discharge.  Swab was obtained.  No lesions Musculoskeletal:        General: Normal range of motion.     Cervical back: Normal range of motion.  Skin:    General: Skin is warm and dry.  Neurological:     General: No focal deficit present.     Mental Status: She is alert.  Psychiatric:        Mood and Affect: Mood normal.        Behavior: Behavior normal.      UC Treatments / Results  Labs (all labs ordered are listed, but only abnormal results are displayed) Labs Reviewed  CERVICOVAGINAL ANCILLARY ONLY    EKG   Radiology No results found.  Procedures Procedures (including critical care time)  Medications Ordered in UC Medications - No data to display  Initial Impression / Assessment and Plan / UC Course  I have  reviewed the triage vital signs and the nursing notes.  Pertinent labs & imaging results that were available during my care of the patient were reviewed by me and considered in my medical decision making (see chart for details).     We will treat patient for vaginitis.  We will test for STDs.  Safe sex is recommended Final Clinical Impressions(s) / UC Diagnoses   Final diagnoses:  Acute vaginitis  Possible exposure to STD     Discharge Instructions     Take the metronidazole and the diflucan as directed This will treat the vaginitis and itching We will call if any of the other swabs are positive Safe sex recommended   ED Prescriptions    Medication Sig Dispense Auth. Provider   metroNIDAZOLE (FLAGYL) 500 MG tablet Take 1 tablet (500 mg total) by mouth 2 (two) times daily. 14 tablet Raylene Everts, MD   fluconazole (DIFLUCAN) 150 MG tablet Take 1 tablet (150 mg total) by mouth daily. Repeat in 1 week if needed 2 tablet Raylene Everts, MD     PDMP not reviewed this encounter.   Raylene Everts, MD 07/25/19 1000

## 2019-07-25 NOTE — Discharge Instructions (Signed)
Take the metronidazole and the diflucan as directed This will treat the vaginitis and itching We will call if any of the other swabs are positive Safe sex recommended

## 2019-07-28 LAB — CERVICOVAGINAL ANCILLARY ONLY
Chlamydia: NEGATIVE
Neisseria Gonorrhea: NEGATIVE
Trichomonas: NEGATIVE

## 2019-10-31 ENCOUNTER — Encounter (HOSPITAL_COMMUNITY): Payer: Self-pay

## 2019-10-31 ENCOUNTER — Emergency Department (HOSPITAL_COMMUNITY)
Admission: EM | Admit: 2019-10-31 | Discharge: 2019-10-31 | Disposition: A | Discharge status: Home / Self Care | Payer: Medicaid Other | Attending: Emergency Medicine | Admitting: Emergency Medicine

## 2019-10-31 ENCOUNTER — Other Ambulatory Visit: Payer: Self-pay

## 2019-10-31 DIAGNOSIS — N899 Noninflammatory disorder of vagina, unspecified: Secondary | ICD-10-CM | POA: Insufficient documentation

## 2019-10-31 DIAGNOSIS — R110 Nausea: Secondary | ICD-10-CM | POA: Diagnosis not present

## 2019-10-31 DIAGNOSIS — Z5321 Procedure and treatment not carried out due to patient leaving prior to being seen by health care provider: Secondary | ICD-10-CM | POA: Diagnosis not present

## 2019-10-31 DIAGNOSIS — R109 Unspecified abdominal pain: Secondary | ICD-10-CM | POA: Insufficient documentation

## 2019-10-31 LAB — CBC
HCT: 36.7 % (ref 36.0–46.0)
Hemoglobin: 11.2 g/dL — ABNORMAL LOW (ref 12.0–15.0)
MCH: 23.6 pg — ABNORMAL LOW (ref 26.0–34.0)
MCHC: 30.5 g/dL (ref 30.0–36.0)
MCV: 77.3 fL — ABNORMAL LOW (ref 80.0–100.0)
Platelets: 392 10*3/uL (ref 150–400)
RBC: 4.75 MIL/uL (ref 3.87–5.11)
RDW: 14.9 % (ref 11.5–15.5)
WBC: 5.8 10*3/uL (ref 4.0–10.5)
nRBC: 0 % (ref 0.0–0.2)

## 2019-10-31 LAB — COMPREHENSIVE METABOLIC PANEL
ALT: 17 U/L (ref 0–44)
AST: 19 U/L (ref 15–41)
Albumin: 3.8 g/dL (ref 3.5–5.0)
Alkaline Phosphatase: 41 U/L (ref 38–126)
Anion gap: 9 (ref 5–15)
BUN: 9 mg/dL (ref 6–20)
CO2: 26 mmol/L (ref 22–32)
Calcium: 9.3 mg/dL (ref 8.9–10.3)
Chloride: 105 mmol/L (ref 98–111)
Creatinine, Ser: 0.86 mg/dL (ref 0.44–1.00)
GFR calc Af Amer: >60 mL/min (ref >60)
GFR calc non Af Amer: >60 mL/min (ref >60)
Glucose, Bld: 90 mg/dL (ref 70–99)
Potassium: 3.9 mmol/L (ref 3.5–5.1)
Sodium: 140 mmol/L (ref 135–145)
Total Bilirubin: 1.3 mg/dL — ABNORMAL HIGH (ref 0.3–1.2)
Total Protein: 7.3 g/dL (ref 6.5–8.1)

## 2019-10-31 LAB — I-STAT BETA HCG BLOOD, ED (MC, WL, AP ONLY): I-stat hCG, quantitative: <5 m[IU]/mL (ref <5)

## 2019-10-31 LAB — URINALYSIS, ROUTINE W REFLEX MICROSCOPIC
Bacteria, UA: NONE SEEN
Bilirubin Urine: NEGATIVE
Glucose, UA: NEGATIVE mg/dL
Ketones, ur: NEGATIVE mg/dL
Leukocytes,Ua: NEGATIVE
Nitrite: NEGATIVE
Protein, ur: NEGATIVE mg/dL
Specific Gravity, Urine: 1.017 (ref 1.005–1.030)
pH: 8 (ref 5.0–8.0)

## 2019-10-31 LAB — LIPASE, BLOOD: Lipase: 25 U/L (ref 11–51)

## 2019-10-31 MED ORDER — SODIUM CHLORIDE 0.9% FLUSH: 3.0000 mL | Freq: Once | INTRAVENOUS | Status: DC

## 2019-10-31 NOTE — ED Triage Notes (Signed)
Pt arrives to ED w/ c/o abdominal pain. Pt endorses nausea, denies v/d. Pt also reports vaginal discharge.
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
## 2019-11-20 ENCOUNTER — Ambulatory Visit
Admission: EM | Admit: 2019-11-20 | Discharge: 2019-11-20 | Disposition: A | Discharge status: Home / Self Care | Payer: Medicaid Other | Attending: Emergency Medicine | Admitting: Emergency Medicine

## 2019-11-20 ENCOUNTER — Encounter: Payer: Self-pay | Admitting: Emergency Medicine

## 2019-11-20 DIAGNOSIS — N898 Other specified noninflammatory disorders of vagina: Secondary | ICD-10-CM | POA: Diagnosis present

## 2019-11-20 DIAGNOSIS — R102 Pelvic and perineal pain: Secondary | ICD-10-CM | POA: Diagnosis not present

## 2019-11-20 DIAGNOSIS — Z7251 High risk heterosexual behavior: Secondary | ICD-10-CM

## 2019-11-20 LAB — POCT URINALYSIS DIP (MANUAL ENTRY)
Bilirubin, UA: NEGATIVE
Blood, UA: NEGATIVE
Glucose, UA: NEGATIVE mg/dL
Ketones, POC UA: NEGATIVE mg/dL
Nitrite, UA: NEGATIVE
Protein Ur, POC: NEGATIVE mg/dL
Spec Grav, UA: 1.015 (ref 1.010–1.025)
Urobilinogen, UA: 0.2 E.U./dL
pH, UA: 8.5 — AB (ref 5.0–8.0)

## 2019-11-20 LAB — POCT URINE PREGNANCY: Preg Test, Ur: NEGATIVE

## 2019-11-20 MED ORDER — AZITHROMYCIN 500 MG PO TABS
1000.0000 mg | ORAL_TABLET | Freq: Once | ORAL | Status: AC
  Administered 2019-11-20: 1000 mg via ORAL

## 2019-11-20 MED ORDER — CEFTRIAXONE SODIUM 500 MG IJ SOLR
500.0000 mg | Freq: Once | INTRAMUSCULAR | Status: AC
  Administered 2019-11-20: 500 mg via INTRAMUSCULAR

## 2019-11-20 MED ORDER — METRONIDAZOLE 500 MG PO TABS
2000.0000 mg | ORAL_TABLET | Freq: Once | ORAL | Status: AC
  Administered 2019-11-20: 2000 mg via ORAL

## 2019-11-20 NOTE — Discharge Instructions (Addendum)
Today you received treatment for chlamydia, gonorrhea, trichomonas. Testing for chlamydia, gonorrhea, trichomonas is pending: please look for these results on the MyChart app/website.  We will notify you if you are positive and outline treatment at that time.  Important to avoid all forms of sexual intercourse (oral, vaginal, anal) with any/all partners for the next 7 days to avoid spreading/reinfecting. Any/all sexual partners should be notified of testing/treatment today.  Return for persistent/worsening symptoms or if you develop fever, abdominal or pelvic pain, blood in your urine, or are re-exposed to an STI. 

## 2019-11-20 NOTE — ED Provider Notes (Signed)
[name]    CSN: [id number] Arrival date & time: 11/20/19  1818      History   Chief Complaint Chief Complaint  Patient presents with  . Vaginal Discharge    HPI Debra Sullivan is a 34 y.o. female with history of psychosis, drug abuse presenting for 2-week course of vaginal discharge, lower abdominal pain, urinary frequency, urgency, dark urine and malodorous urine.  Denies upper abdominal pain, back pain, fever.  No nausea, vomiting.  Patient is s/p BTL: Low concern for pregnancy.  Patient currently sexually active with 1 female partner, not routinely using condoms.  Patient is concerned that partner has more than 1 sexual partners and may have transmitted an STI.  Patient denies known exposure to STI.     Past Medical History:  Diagnosis Date  . Anemia   . Drug abuse (HCC)   . Psychosis (HCC) 2013    Patient Active Problem List   Diagnosis Date Noted  . Bipolar I disorder, most recent episode (or current) manic (HCC) 06/14/2018  . VBAC, delivered, current hospitalization 01/22/2018  . Supervision of high-risk pregnancy 01/21/2018  . LGA (large for gestational age) fetus affecting management of mother, third trimester, fetus 1 01/08/2018  . Anemia during pregnancy in third trimester 11/28/2017  . Drug dependence affecting pregnancy in third trimester   . Suspected fetal anomaly, antepartum   . Trichomonosis 08/01/2017  . Not immune to rubella 08/01/2017  . Supervision of other normal pregnancy, antepartum 07/11/2017  . History of cesarean section complicating pregnancy 07/11/2017  . Unwanted fertility 07/11/2017  . 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 Cannabis use disorder, severe, dependence (HCC) 04/15/2015  . PTSD (post-traumatic stress disorder) 04/15/2015  . Cocaine use disorder, moderate, in early remission (HCC) 04/15/2015  . Psychosis (HCC) 04/14/2015    Past Surgical History:  Procedure  Laterality Date  . CESAREAN SECTION  03/2016   pLTCS for twin B at [facility]  . LAPAROSCOPY N/A 04/14/2017   Procedure: LAPAROSCOPY OPERATIVE WITH RIGHT SALPINGECTOMY;  Surgeon: Conan Bowens, MD;  Location: [facility];  Service: Gynecology;  Laterality: N/A;  . TUBAL LIGATION Bilateral 01/22/2018   Procedure: POST PARTUM TUBAL LIGATION;  Surgeon: [name], DO;  Location: [facility];  Service: Gynecology;  Laterality: Bilateral;    OB History    Gravida  6   Para  5   Term  4   Preterm  1   AB  1   Living  6     SAB  0   TAB      Ectopic  1   Multiple  1   Live Births  6            Home Medications    Prior to Admission medications   Medication Sig Start Date End Date Taking? Authorizing Provider  fluconazole (DIFLUCAN) 150 MG tablet Take 1 tablet (150 mg total) by mouth daily. Repeat in 1 week if needed 07/25/19   Eustace Moore, MD  ibuprofen (ADVIL) 800 MG tablet Take 1 tablet (800 mg total) by mouth 3 (three) times daily. 07/15/19   Mardella Layman, MD  metroNIDAZOLE (FLAGYL) 500 MG tablet Take 1 tablet (500 mg total) by mouth 2 (two) times daily. 07/25/19   Eustace Moore, MD    Family History Family History  Problem Relation Age of Onset  . Diabetes Mother   . Schizophrenia Mother   . Diabetes  Brother   . Hypertension Maternal Aunt   . Healthy Father     Social History Social History   Tobacco Use  . Smoking status: Current Every Day Smoker    Packs/day: 0.30    Types: Cigarettes  . Smokeless tobacco: Never Used  Substance Use Topics  . Alcohol use: Yes    Alcohol/week: 0.0 standard drinks    Comment: socially  . Drug use: Yes    Types: Marijuana, Cocaine    Comment: Cocaine & Marijuana was used10/26/2018     Allergies   Naproxen   Review of Systems As per HPI   Physical Exam Triage Vital Signs ED Triage Vitals  Enc Vitals Group     BP      Pulse      Resp      Temp      Temp src      SpO2      Weight        Height      Head Circumference      Peak Flow      Pain Score      Pain Loc      Pain Edu?      Excl. in GC?    No data found.  Updated Vital Signs There were no vitals taken for this visit.  Visual Acuity Right Eye Distance:   Left Eye Distance:   Bilateral Distance:    Right Eye Near:   Left Eye Near:    Bilateral Near:     Physical Exam Constitutional:      General: She is not in acute distress. HENT:     Head: Normocephalic and atraumatic.  Eyes:     General: No scleral icterus.    Pupils: Pupils are equal, round, and reactive to light.  Cardiovascular:     Rate and Rhythm: Normal rate.  Pulmonary:     Effort: Pulmonary effort is normal.  Genitourinary:    General: Normal vulva.     Exam position: Supine.     Pubic Area: No rash or pubic lice.      Tanner stage (genital): 5.     Labia:        Right: No rash or tenderness.        Left: No rash or tenderness.      Urethra: No prolapse or urethral pain.     Vagina: No foreign body. Vaginal discharge present. No erythema or bleeding.     Cervix: Cervical motion tenderness and discharge present. No friability or cervical bleeding.     Uterus: Normal.      Adnexa: Right adnexa normal and left adnexa normal.     Comments: Mild CMT Skin:    Coloration: Skin is not jaundiced or pale.  Neurological:     Mental Status: She is alert and oriented to person, place, and time.      UC Treatments / Results  Labs (all labs ordered are listed, but only abnormal results are displayed) Labs Reviewed  POCT URINALYSIS DIP (MANUAL ENTRY) - Abnormal; Notable for the following components:      Result Value   Clarity, UA cloudy (*)    pH, UA 8.5 (*)    Leukocytes, UA Large (3+) (*)    All other components within normal limits  POCT URINE PREGNANCY - Normal  URINE CULTURE  CERVICOVAGINAL ANCILLARY ONLY    EKG   Radiology No results found.  Procedures Procedures (including critical care time)  Medications  Ordered in UC Medications  cefTRIAXone (ROCEPHIN) injection 500 mg (500 mg Intramuscular Given 11/20/19 1854)  azithromycin (ZITHROMAX) tablet 1,000 mg (1,000 mg Oral Given 11/20/19 1854)  metroNIDAZOLE (FLAGYL) tablet 2,000 mg (2,000 mg Oral Given 11/20/19 1854)    Initial Impression / Assessment and Plan / UC Course  I have reviewed the triage vital signs and the nursing notes.  Pertinent labs & imaging results that were available during my care of the patient were reviewed by me and considered in my medical decision making (see chart for details).     Patient afebrile, nontoxic in office today.  Urine pregnancy negative, urine dipstick significant for large leukocytes, alkaline pH.  This in conjunction with copious vaginal discharge and mild CMT raises concern for trichomonas+/-chlamydia and gonorrhea.  Patient treated empirically for all 3 in office: Tolerated well.  Cytology pending.  Urine culture pending: We will treat if indicated.  Safe sex practice/abstinence reviewed as outlined below.  Return precautions discussed, patient verbalized understanding and is agreeable to plan. Final Clinical Impressions(s) / UC Diagnoses   Final diagnoses:  Pelvic pain  Vaginal discharge  Unprotected sex     Discharge Instructions     Today you received treatment for chlamydia, gonorrhea, trichomonas. Testing for chlamydia, gonorrhea, trichomonas is pending: please look for these results on the MyChart app/website.  We will notify you if you are positive and outline treatment at that time.  Important to avoid all forms of sexual intercourse (oral, vaginal, anal) with any/all partners for the next 7 days to avoid spreading/reinfecting. Any/all sexual partners should be notified of testing/treatment today.  Return for persistent/worsening symptoms or if you develop fever, abdominal or pelvic pain, blood in your urine, or are re-exposed to an STI.    ED Prescriptions    None     PDMP not  reviewed this encounter.   Neldon Mc [place]yline, [place] 11/20/19 1914

## 2019-11-22 LAB — URINE CULTURE: Culture: NO GROWTH

## 2019-11-24 LAB — CERVICOVAGINAL ANCILLARY ONLY
Chlamydia: NEGATIVE
Comment: NEGATIVE
Comment: NEGATIVE
Comment: NORMAL
Neisseria Gonorrhea: POSITIVE — AB
Trichomonas: NEGATIVE

## 2020-01-19 ENCOUNTER — Encounter (HOSPITAL_COMMUNITY): Payer: Self-pay | Admitting: *Deleted

## 2020-01-19 ENCOUNTER — Emergency Department (HOSPITAL_COMMUNITY)
Admission: EM | Admit: 2020-01-19 | Discharge: 2020-01-19 | Disposition: A | Discharge status: Home / Self Care | Payer: Medicaid Other | Attending: Emergency Medicine | Admitting: Emergency Medicine

## 2020-01-19 ENCOUNTER — Other Ambulatory Visit: Payer: Self-pay

## 2020-01-19 DIAGNOSIS — Z5321 Procedure and treatment not carried out due to patient leaving prior to being seen by health care provider: Secondary | ICD-10-CM | POA: Diagnosis not present

## 2020-01-19 DIAGNOSIS — O26892 Other specified pregnancy related conditions, second trimester: Secondary | ICD-10-CM | POA: Insufficient documentation

## 2020-01-19 DIAGNOSIS — R109 Unspecified abdominal pain: Secondary | ICD-10-CM | POA: Diagnosis not present

## 2020-01-19 DIAGNOSIS — Z3A20 20 weeks gestation of pregnancy: Secondary | ICD-10-CM | POA: Diagnosis not present

## 2020-01-19 LAB — CBC
HCT: 34.9 % — ABNORMAL LOW (ref 36.0–46.0)
Hemoglobin: 10.8 g/dL — ABNORMAL LOW (ref 12.0–15.0)
MCH: 23 pg — ABNORMAL LOW (ref 26.0–34.0)
MCHC: 30.9 g/dL (ref 30.0–36.0)
MCV: 74.3 fL — ABNORMAL LOW (ref 80.0–100.0)
Platelets: 419 10*3/uL — ABNORMAL HIGH (ref 150–400)
RBC: 4.7 MIL/uL (ref 3.87–5.11)
RDW: 15.4 % (ref 11.5–15.5)
WBC: 10.2 10*3/uL (ref 4.0–10.5)
nRBC: 0 % (ref 0.0–0.2)

## 2020-01-19 LAB — COMPREHENSIVE METABOLIC PANEL
ALT: 19 U/L (ref 0–44)
AST: 18 U/L (ref 15–41)
Albumin: 3.7 g/dL (ref 3.5–5.0)
Alkaline Phosphatase: 38 U/L (ref 38–126)
Anion gap: 7 (ref 5–15)
BUN: 8 mg/dL (ref 6–20)
CO2: 22 mmol/L (ref 22–32)
Calcium: 9.1 mg/dL (ref 8.9–10.3)
Chloride: 108 mmol/L (ref 98–111)
Creatinine, Ser: 0.78 mg/dL (ref 0.44–1.00)
GFR calc Af Amer: >60 mL/min (ref >60)
GFR calc non Af Amer: >60 mL/min (ref >60)
Glucose, Bld: 92 mg/dL (ref 70–99)
Potassium: 3.5 mmol/L (ref 3.5–5.1)
Sodium: 137 mmol/L (ref 135–145)
Total Bilirubin: 1.2 mg/dL (ref 0.3–1.2)
Total Protein: 7.2 g/dL (ref 6.5–8.1)

## 2020-01-19 LAB — URINALYSIS, ROUTINE W REFLEX MICROSCOPIC
Bilirubin Urine: NEGATIVE
Glucose, UA: NEGATIVE mg/dL
Hgb urine dipstick: NEGATIVE
Ketones, ur: 5 mg/dL — AB
Leukocytes,Ua: NEGATIVE
Nitrite: NEGATIVE
Protein, ur: NEGATIVE mg/dL
Specific Gravity, Urine: 1.016 (ref 1.005–1.030)
pH: 5 (ref 5.0–8.0)

## 2020-01-19 LAB — LIPASE, BLOOD: Lipase: 30 U/L (ref 11–51)

## 2020-01-19 LAB — I-STAT BETA HCG BLOOD, ED (MC, WL, AP ONLY): I-stat hCG, quantitative: <5 m[IU]/mL (ref <5)

## 2020-01-19 MED ORDER — SODIUM CHLORIDE 0.9% FLUSH: 3.0000 mL | Freq: Once | INTRAVENOUS | Status: DC
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
## 2020-01-19 NOTE — ED Notes (Signed)
Called pt 3x no response °

## 2020-01-19 NOTE — ED Triage Notes (Signed)
The pt is c/o abd pain for 2-3 days she reports that she is [redacted] weeks pregnant  No vaginal bleeding  No prevatal care

## 2020-01-23 ENCOUNTER — Encounter: Payer: Self-pay | Admitting: Physician Assistant

## 2020-01-23 ENCOUNTER — Ambulatory Visit
Admission: EM | Admit: 2020-01-23 | Discharge: 2020-01-23 | Disposition: A | Discharge status: Home / Self Care | Payer: Medicaid Other | Attending: Physician Assistant | Admitting: Physician Assistant

## 2020-01-23 DIAGNOSIS — R1031 Right lower quadrant pain: Secondary | ICD-10-CM | POA: Insufficient documentation

## 2020-01-23 DIAGNOSIS — N898 Other specified noninflammatory disorders of vagina: Secondary | ICD-10-CM | POA: Diagnosis present

## 2020-01-23 DIAGNOSIS — R1032 Left lower quadrant pain: Secondary | ICD-10-CM | POA: Insufficient documentation

## 2020-01-23 LAB — POCT URINALYSIS DIP (MANUAL ENTRY)
Bilirubin, UA: NEGATIVE
Blood, UA: NEGATIVE
Glucose, UA: NEGATIVE mg/dL
Ketones, POC UA: NEGATIVE mg/dL
Leukocytes, UA: NEGATIVE
Nitrite, UA: NEGATIVE
Protein Ur, POC: NEGATIVE mg/dL
Spec Grav, UA: >=1.03 — AB (ref 1.010–1.025)
Urobilinogen, UA: 1 E.U./dL
pH, UA: 6.5 (ref 5.0–8.0)

## 2020-01-23 LAB — POCT URINE PREGNANCY: Preg Test, Ur: NEGATIVE

## 2020-01-23 MED ORDER — METRONIDAZOLE 500 MG PO TABS
500.0000 mg | ORAL_TABLET | Freq: Two times a day (BID) | ORAL | 0 refills | Status: DC
Start: 2020-01-23 — End: 2020-03-27

## 2020-01-23 NOTE — ED Triage Notes (Signed)
Pt c/o white vaginal discharge with foul odor for approx 1 week. Also reports lower abdominal/pelvic pain, urinary frequency, urgency. Denies hematuria, n/v/d, fever, chills.

## 2020-01-23 NOTE — ED Provider Notes (Signed)
[name]    CSN: [id number] Arrival date & time: 01/23/20  1056      History   Chief Complaint Chief Complaint  Patient presents with   Vaginal Discharge    HPI Debra Sullivan is a 34 y.o. female.   34 year old female comes in for 1 week history of vaginal discharge with odor. Some vaginal itching. No vaginal bleeding. Has had lower abdominal/pelvic pain, urinary frequency, urgency. Denies hematuria. Denies fever, chills, body aches. Denies nausea/vomiting/diarrhea. LMP 12/23/2019. Sexually active, 2 female partners, no condom use. S/p tubal ligation 2019.      Past Medical History:  Diagnosis Date   Anemia    Drug abuse (HCC)    Psychosis (HCC) 2013    Patient Active Problem List   Diagnosis Date Noted   Bipolar I disorder, most recent episode (or current) manic (HCC) 06/14/2018   VBAC, delivered, current hospitalization 01/22/2018   Supervision of high-risk pregnancy 01/21/2018   LGA (large for gestational age) fetus affecting management of mother, third trimester, fetus 1 01/08/2018   Anemia during pregnancy in third trimester 11/28/2017   Drug dependence affecting pregnancy in third trimester    Suspected fetal anomaly, antepartum    Trichomonosis 08/01/2017   Not immune to rubella 08/01/2017   Supervision of other normal pregnancy, antepartum 07/11/2017   History of cesarean section complicating pregnancy 07/11/2017   Unwanted fertility 07/11/2017   MDD (major depressive disorder), single episode, severe with psychosis (HCC) 04/15/2015   Cannabis use disorder, severe, dependence (HCC) 04/15/2015   PTSD (post-traumatic stress disorder) 04/15/2015   Cocaine use disorder, moderate, in early remission (HCC) 04/15/2015   Psychosis (HCC) 04/14/2015    Past Surgical History:  Procedure Laterality Date   CESAREAN SECTION  03/2016   [facility]LTCS for twin B at [facility]   LAPAROSCOPY N/A 04/14/2017   Procedure: LAPAROSCOPY OPERATIVE  WITH RIGHT SALPINGECTOMY;  Surgeon: Conan Bowens, MD;  Location: [facility];  Service: Gynecology;  Laterality: N/A;   TUBAL LIGATION Bilateral 01/22/2018   Procedure: POST PARTUM TUBAL LIGATION;  Surgeon: [name], DO;  Location: [facility];  Service: Gynecology;  Laterality: Bilateral;    OB History    Gravida  6   Para  5   Term  4   Preterm  1   AB  1   Living  6     SAB  0   TAB      Ectopic  1   Multiple  1   Live Births  6            Home Medications    Prior to Admission medications   Medication Sig Start Date End Date Taking? Authorizing Provider  ibuprofen (ADVIL) 800 MG tablet Take 1 tablet (800 mg total) by mouth 3 (three) times daily. 07/15/19   Mardella Layman, MD  metroNIDAZOLE (FLAGYL) 500 MG tablet Take 1 tablet (500 mg total) by mouth 2 (two) times daily. 01/23/20   Belinda Fisher, PA-C    Family History Family History  Problem Relation Age of Onset   Diabetes Mother    Schizophrenia Mother    Diabetes Brother    Hypertension Maternal Aunt    Healthy Father     Social History Social History   Tobacco Use   Smoking status: Current Every Day Smoker    Packs/day: 0.30    Types: Cigarettes   Smokeless tobacco: Never Used  Vaping Use   Vaping Use: Never  used  Substance Use Topics   Alcohol use: Yes    Alcohol/week: 0.0 standard drinks    Comment: socially   Drug use: Yes    Types: Marijuana, Cocaine    Comment: Cocaine & Marijuana was used10/26/2018     Allergies   Naproxen   Review of Systems Review of Systems  Reason unable to perform ROS: See HPI as above.     Physical Exam Triage Vital Signs ED Triage Vitals [01/23/20 1135]  Enc Vitals Group     BP 125/75     Pulse Rate 98     Resp 18     Temp 98.8 F (37.1 C)     Temp Source Oral     SpO2 98 %     Weight      Height      Head Circumference      Peak Flow      Pain Score      Pain Loc      Pain Edu?      Excl. in GC?    No data  found.  Updated Vital Signs BP 125/75 (BP Location: Left Arm)    Pulse 98    Temp 98.8 F (37.1 C) (Oral)    Resp 18    LMP 12/23/2019 (Approximate)    SpO2 98%   Physical Exam Exam conducted with a chaperone present.  Constitutional:      General: She is not in acute distress.    Appearance: She is well-developed. She is not ill-appearing, toxic-appearing or diaphoretic.  HENT:     Head: Normocephalic and atraumatic.  Eyes:     Conjunctiva/sclera: Conjunctivae normal.     Pupils: Pupils are equal, round, and reactive to light.  Cardiovascular:     Rate and Rhythm: Normal rate and regular rhythm.  Pulmonary:     Effort: Pulmonary effort is normal. No respiratory distress.     Comments: LCTAB Abdominal:     General: Bowel sounds are normal.     Palpations: Abdomen is soft.     Tenderness: There is no abdominal tenderness. There is no right CVA tenderness, left CVA tenderness, guarding or rebound.  Genitourinary:    Vagina: No vaginal discharge.     Cervix: No cervical motion tenderness, discharge or erythema.     Uterus: Normal. Not tender.      Adnexa: Right adnexa normal and left adnexa normal.       Right: No tenderness.         Left: No tenderness.    Musculoskeletal:     Cervical back: Normal range of motion and neck supple.  Skin:    General: Skin is warm and dry.  Neurological:     Mental Status: She is alert and oriented to person, place, and time.  Psychiatric:        Behavior: Behavior normal.        Judgment: Judgment normal.      UC Treatments / Results  Labs (all labs ordered are listed, but only abnormal results are displayed) Labs Reviewed  POCT URINALYSIS DIP (MANUAL ENTRY) - Abnormal; Notable for the following components:      Result Value   Spec Grav, UA >=1.030 (*)    All other components within normal limits  POCT URINE PREGNANCY  CERVICOVAGINAL ANCILLARY ONLY    EKG   Radiology No results found.  Procedures Procedures (including  critical care time)  Medications Ordered in UC Medications - No data to display  Initial Impression / Assessment and Plan / UC Course  I have reviewed the triage vital signs and the nursing notes.  Pertinent labs & imaging results that were available during my care of the patient were reviewed by me and considered in my medical decision making (see chart for details).    Dipstick negative for infection. Vaginal canal without obvious discharge. No CMT tenderness. Cytology sent. Flagyl for possible BV. Return precautions given.  Final Clinical Impressions(s) / UC Diagnoses   Final diagnoses:  Vaginal discharge  Bilateral lower abdominal pain    ED Prescriptions    Medication Sig Dispense Auth. Provider   metroNIDAZOLE (FLAGYL) 500 MG tablet Take 1 tablet (500 mg total) by mouth 2 (two) times daily. 14 tablet Belinda Fisher, PA-C     PDMP not reviewed this encounter.   Belinda Fisher, PA-C 01/23/20 1436

## 2020-01-23 NOTE — Discharge Instructions (Signed)
You were treated empirically for BV. Start flagyl as directed. Cytology sent, you will be contacted with any positive results that requires further treatment. Refrain from sexual activity and alcohol use for the next 7 days. Monitor for any worsening of symptoms, fever, worsening abdominal pain, nausea, vomiting, go to the ED for further evaluation. Otherwise, follow up with PCP if symptoms not improving.

## 2020-01-26 LAB — CERVICOVAGINAL ANCILLARY ONLY
Chlamydia: NEGATIVE
Comment: NEGATIVE
Comment: NEGATIVE
Comment: NORMAL
Neisseria Gonorrhea: NEGATIVE
Trichomonas: NEGATIVE

## 2020-03-26 ENCOUNTER — Other Ambulatory Visit: Payer: Self-pay

## 2020-03-26 ENCOUNTER — Encounter (HOSPITAL_COMMUNITY): Payer: Self-pay | Admitting: Emergency Medicine

## 2020-03-26 ENCOUNTER — Encounter (HOSPITAL_COMMUNITY): Payer: Self-pay

## 2020-03-26 ENCOUNTER — Emergency Department (HOSPITAL_COMMUNITY)
Admission: EM | Admit: 2020-03-26 | Discharge: 2020-03-26 | Disposition: A | Discharge status: Home / Self Care | Payer: Medicaid Other | Source: Home / Self Care | Attending: Emergency Medicine | Admitting: Emergency Medicine

## 2020-03-26 ENCOUNTER — Encounter (HOSPITAL_COMMUNITY): Payer: Self-pay | Admitting: *Deleted

## 2020-03-26 ENCOUNTER — Inpatient Hospital Stay (HOSPITAL_COMMUNITY)
Admission: AD | Admit: 2020-03-26 | Discharge: 2020-03-26 | Discharge status: Against Medical Advice | Payer: Medicaid Other | Source: Home / Self Care | Attending: Obstetrics and Gynecology | Admitting: Obstetrics and Gynecology

## 2020-03-26 ENCOUNTER — Emergency Department (HOSPITAL_COMMUNITY)
Admission: EM | Admit: 2020-03-26 | Discharge: 2020-03-26 | Disposition: A | Discharge status: Home / Self Care | Payer: Medicaid Other | Attending: Emergency Medicine | Admitting: Emergency Medicine

## 2020-03-26 ENCOUNTER — Ambulatory Visit (HOSPITAL_COMMUNITY)
Admission: EM | Admit: 2020-03-26 | Discharge: 2020-03-27 | Disposition: A | Discharge status: Other Acute Inpatient Hospital | Payer: Medicaid Other | Attending: Nurse Practitioner | Admitting: Nurse Practitioner

## 2020-03-26 DIAGNOSIS — R45851 Suicidal ideations: Secondary | ICD-10-CM | POA: Insufficient documentation

## 2020-03-26 DIAGNOSIS — Z20822 Contact with and (suspected) exposure to covid-19: Secondary | ICD-10-CM | POA: Insufficient documentation

## 2020-03-26 DIAGNOSIS — F1290 Cannabis use, unspecified, uncomplicated: Secondary | ICD-10-CM | POA: Insufficient documentation

## 2020-03-26 DIAGNOSIS — F1490 Cocaine use, unspecified, uncomplicated: Secondary | ICD-10-CM | POA: Insufficient documentation

## 2020-03-26 DIAGNOSIS — E876 Hypokalemia: Secondary | ICD-10-CM | POA: Insufficient documentation

## 2020-03-26 DIAGNOSIS — R109 Unspecified abdominal pain: Secondary | ICD-10-CM | POA: Insufficient documentation

## 2020-03-26 DIAGNOSIS — F419 Anxiety disorder, unspecified: Secondary | ICD-10-CM | POA: Insufficient documentation

## 2020-03-26 DIAGNOSIS — N939 Abnormal uterine and vaginal bleeding, unspecified: Secondary | ICD-10-CM | POA: Insufficient documentation

## 2020-03-26 DIAGNOSIS — Z5321 Procedure and treatment not carried out due to patient leaving prior to being seen by health care provider: Secondary | ICD-10-CM | POA: Insufficient documentation

## 2020-03-26 DIAGNOSIS — F17210 Nicotine dependence, cigarettes, uncomplicated: Secondary | ICD-10-CM | POA: Insufficient documentation

## 2020-03-26 DIAGNOSIS — B349 Viral infection, unspecified: Secondary | ICD-10-CM | POA: Diagnosis not present

## 2020-03-26 DIAGNOSIS — R440 Auditory hallucinations: Secondary | ICD-10-CM | POA: Insufficient documentation

## 2020-03-26 DIAGNOSIS — F319 Bipolar disorder, unspecified: Secondary | ICD-10-CM | POA: Insufficient documentation

## 2020-03-26 DIAGNOSIS — Z7289 Other problems related to lifestyle: Secondary | ICD-10-CM | POA: Insufficient documentation

## 2020-03-26 DIAGNOSIS — F4310 Post-traumatic stress disorder, unspecified: Secondary | ICD-10-CM | POA: Insufficient documentation

## 2020-03-26 DIAGNOSIS — R450 Nervousness: Secondary | ICD-10-CM | POA: Insufficient documentation

## 2020-03-26 DIAGNOSIS — R070 Pain in throat: Secondary | ICD-10-CM | POA: Diagnosis present

## 2020-03-26 DIAGNOSIS — Z79899 Other long term (current) drug therapy: Secondary | ICD-10-CM | POA: Insufficient documentation

## 2020-03-26 DIAGNOSIS — G4700 Insomnia, unspecified: Secondary | ICD-10-CM | POA: Insufficient documentation

## 2020-03-26 LAB — CBC WITH DIFFERENTIAL/PLATELET
Abs Immature Granulocytes: 0.02 10*3/uL (ref 0.00–0.07)
Basophils Absolute: 0 10*3/uL (ref 0.0–0.1)
Basophils Relative: 1 %
Eosinophils Absolute: 0.3 10*3/uL (ref 0.0–0.5)
Eosinophils Relative: 4 %
HCT: 34.2 % — ABNORMAL LOW (ref 36.0–46.0)
Hemoglobin: 10.7 g/dL — ABNORMAL LOW (ref 12.0–15.0)
Immature Granulocytes: 0 %
Lymphocytes Relative: 43 %
Lymphs Abs: 3.2 10*3/uL (ref 0.7–4.0)
MCH: 23.6 pg — ABNORMAL LOW (ref 26.0–34.0)
MCHC: 31.3 g/dL (ref 30.0–36.0)
MCV: 75.3 fL — ABNORMAL LOW (ref 80.0–100.0)
Monocytes Absolute: 0.6 10*3/uL (ref 0.1–1.0)
Monocytes Relative: 8 %
Neutro Abs: 3.3 10*3/uL (ref 1.7–7.7)
Neutrophils Relative %: 44 %
Platelets: 313 10*3/uL (ref 150–400)
RBC: 4.54 MIL/uL (ref 3.87–5.11)
RDW: 15.2 % (ref 11.5–15.5)
WBC: 7.4 10*3/uL (ref 4.0–10.5)
nRBC: 0 % (ref 0.0–0.2)

## 2020-03-26 LAB — POC SARS CORONAVIRUS 2 AG -  ED: SARS Coronavirus 2 Ag: NEGATIVE

## 2020-03-26 LAB — HEMOGLOBIN A1C
Hgb A1c MFr Bld: 4.9 % (ref 4.8–5.6)
Mean Plasma Glucose: 93.93 mg/dL

## 2020-03-26 LAB — POCT PREGNANCY, URINE: Preg Test, Ur: NEGATIVE

## 2020-03-26 LAB — POC SARS CORONAVIRUS 2 AG: SARS Coronavirus 2 Ag: NEGATIVE

## 2020-03-26 MED ORDER — TRAZODONE HCL 50 MG PO TABS: 50.0000 mg | ORAL_TABLET | Freq: Every evening | ORAL | Status: DC | PRN

## 2020-03-26 MED ORDER — ACETAMINOPHEN 325 MG PO TABS: 650.0000 mg | ORAL_TABLET | Freq: Four times a day (QID) | ORAL | Status: DC | PRN

## 2020-03-26 MED ORDER — HYDROXYZINE HCL 25 MG PO TABS: 25.0000 mg | ORAL_TABLET | Freq: Three times a day (TID) | ORAL | Status: DC | PRN

## 2020-03-26 MED ORDER — MAGNESIUM HYDROXIDE 400 MG/5ML PO SUSP: 30.0000 mL | Freq: Every day | ORAL | Status: DC | PRN

## 2020-03-26 MED ORDER — ACETAMINOPHEN 500 MG PO TABS
1000.0000 mg | ORAL_TABLET | Freq: Once | ORAL | Status: AC
  Administered 2020-03-26: 1000 mg via ORAL
  Filled 2020-03-26: qty 2

## 2020-03-26 MED ORDER — ALUM & MAG HYDROXIDE-SIMETH 200-200-20 MG/5ML PO SUSP: 30.0000 mL | ORAL | Status: DC | PRN

## 2020-03-26 MED ORDER — LORAZEPAM 1 MG PO TABS
1.0000 mg | ORAL_TABLET | Freq: Once | ORAL | Status: AC
  Administered 2020-03-26: 1 mg via ORAL
  Filled 2020-03-26: qty 1

## 2020-03-26 MED ORDER — RISPERIDONE 2 MG PO TBDP
2.0000 mg | ORAL_TABLET | Freq: Once | ORAL | Status: AC
  Administered 2020-03-26: 2 mg via ORAL
  Filled 2020-03-26: qty 1
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
## 2020-03-26 NOTE — ED Provider Notes (Signed)
[facility] Emergency Department Provider Note MRN:  [id number]  Arrival date & time: 03/26/20     Chief Complaint   Sore Throat   History of Present Illness   Debra Sullivan is a 34 y.o. year-old female with no pertinent medical history presenting to the ED with chief complaint of sore throat.  Patient is endorsing onset of malaise, sore throat, cough, body aches, dull frontal headache today.  Symptoms are mild, constant, no exacerbating or alleviating factors.  Denies chest pain or shortness of breath, no leg pain or swelling.  Review of Systems  A complete 10 system review of systems was obtained and all systems are negative except as noted in the HPI and PMH.   Patient's Health History    Past Medical History:  Diagnosis Date  . Anemia   . Drug abuse (HCC)   . Psychosis (HCC) 2013    Past Surgical History:  Procedure Laterality Date  . CESAREAN SECTION  03/2016   pLTCS for twin B at [facility]  . LAPAROSCOPY N/A 04/14/2017   Procedure: LAPAROSCOPY OPERATIVE WITH RIGHT SALPINGECTOMY;  Surgeon: Conan Bowens, MD;  Location: [facility];  Service: Gynecology;  Laterality: N/A;  . TUBAL LIGATION Bilateral 01/22/2018   Procedure: POST PARTUM TUBAL LIGATION;  Surgeon: [name], DO;  Location: [facility];  Service: Gynecology;  Laterality: Bilateral;    Family History  Problem Relation Age of Onset  . Diabetes Mother   . Schizophrenia Mother   . Diabetes Brother   . Hypertension Maternal Aunt   . Healthy Father     Social History   Socioeconomic History  . Marital status: Single    Spouse name: Not on file  . Number of children: Not on file  . Years of education: Not on file  . Highest education level: Not on file  Occupational History  . Not on file  Tobacco Use  . Smoking status: Current Every Day Smoker    Packs/day: 0.30    Types: Cigarettes  . Smokeless tobacco: Never Used  Vaping Use  . Vaping Use: Never used   Substance and Sexual Activity  . Alcohol use: Yes    Alcohol/week: 0.0 standard drinks    Comment: socially  . Drug use: Yes    Types: Marijuana, Cocaine    Comment: Cocaine & Marijuana was used10/26/2018  . Sexual activity: Yes    Birth control/protection: None  Other Topics Concern  . Not on file  Social History Narrative  . Not on file   Social Determinants of Health   Financial Resource Strain:   . Difficulty of Paying Living Expenses: Not on file  Food Insecurity:   . Worried About Programme researcher, broadcasting/film/video in the Last Year: Not on file  . Ran Out of Food in the Last Year: Not on file  Transportation Needs:   . Lack of Transportation (Medical): Not on file  . Lack of Transportation (Non-Medical): Not on file  Physical Activity:   . Days of Exercise per Week: Not on file  . Minutes of Exercise per Session: Not on file  Stress:   . Feeling of Stress : Not on file  Social Connections:   . Frequency of Communication with Friends and Family: Not on file  . Frequency of Social Gatherings with Friends and Family: Not on file  . Attends Religious Services: Not on file  . Active Member of Clubs or Organizations: Not on file  . Attends  Club or Organization Meetings: Not on file  . Marital Status: Not on file  Intimate Partner Violence:   . Fear of Current or Ex-Partner: Not on file  . Emotionally Abused: Not on file  . Physically Abused: Not on file  . Sexually Abused: Not on file     Physical Exam   Vitals:   03/26/20 0030  BP: 125/80  Pulse: 84  Resp: 16  Temp: 98.3 F (36.8 C)  SpO2: 100%    CONSTITUTIONAL: Well-appearing, NAD NEURO:  Alert and oriented x 3, no focal deficits EYES:  eyes equal and reactive ENT/NECK:  no LAD, no JVD CARDIO: Regular rate, well-perfused, normal S1 and S2 PULM:  CTAB no wheezing or rhonchi GI/GU:  normal bowel sounds, non-distended, non-tender MSK/SPINE:  No gross deformities, no edema SKIN:  no rash, atraumatic PSYCH:   Appropriate speech and behavior  *Additional and/or pertinent findings included in MDM below  Diagnostic and Interventional Summary    EKG Interpretation  Date/Time:    Ventricular Rate:    PR Interval:    QRS Duration:   QT Interval:    QTC Calculation:   R Axis:     Text Interpretation:        Labs Reviewed  RESPIRATORY PANEL BY RT PCR (FLU A&B, COVID)    No orders to display    Medications  acetaminophen (TYLENOL) tablet 1,000 mg (has no administration in time range)     Procedures  /  Critical Care Procedures  ED Course and Medical Decision Making  I have reviewed the triage vital signs, the nursing notes, and pertinent available records from the EMR.  Listed above are laboratory and imaging tests that I personally ordered, reviewed, and interpreted and then considered in my medical decision making (see below for details).  Symptoms consistent with viral illness, no increased work of breathing, normal vital signs, abdomen soft and nontender, posterior oropharynx appears normal.  Swab for Covid, appropriate for discharge with precautions.       Elmer Sow. Pilar Plate, MD [facility] Emergency Medicine [facility] [email or web address]  Final Clinical Impressions(s) / ED Diagnoses     ICD-10-CM   1. Viral illness  B34.9     ED Discharge Orders    None       Discharge Instructions Discussed with and Provided to Patient:     Discharge Instructions     You were evaluated in the Emergency Department and after careful evaluation, we did not find any emergent condition requiring admission or further testing in the hospital.  Your exam/testing today is overall reassuring.  Your symptoms seem to be due to a viral illness, possibly the coronavirus.  We have tested you for the coronavirus here in the Emergency Department.  Please isolate or quarantine at home until you receive a negative test result.  If positive, we recommend continued home quarantine  per [facility] recommendations.   Please return to the Emergency Department if you experience any worsening of your condition.   Thank you for allowing [place] to be a part of your care.      Sabas Sous, MD 03/26/20 [phone]

## 2020-03-26 NOTE — MAU Note (Signed)
Debra Sullivan is a 34 y.o.  here in MAU reporting: here with heavy vaginal bleeding for the past 3 hours. Also reporting abdominal pain for the past 3 hours.   LMP: 02/24/20  Onset of complaint: 3 hours  Pain score: 10/10  Lab orders placed from triage: UPT  Pt left the unit looking for her bus pass, Francia Greaves NT walked with patient towards security but patient kept walking looking for her bus pass. Triage was not completed at this time. Will leave patient on the board in case she returns.

## 2020-03-26 NOTE — MAU Note (Signed)
Pt called, not in lobby 

## 2020-03-26 NOTE — MAU Note (Signed)
Pt called, not in lobby. Will take patient off the board. Steward Drone CNM made aware.

## 2020-03-26 NOTE — ED Triage Notes (Signed)
Patient states she is having vaginal bleeding with clots, onset 3 hours ago, states she is also having abd. Patient has attempted to check in several different time tonight however checks in under a different name each time.

## 2020-03-26 NOTE — ED Notes (Signed)
Provider informed refused covid nasal test

## 2020-03-26 NOTE — ED Notes (Signed)
Dr Verlon Au at bedside

## 2020-03-26 NOTE — ED Notes (Signed)
Pt refused nasal covid test. Pt states" I don't like stuff up her nose, I don't think I can do that. "
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
## 2020-03-26 NOTE — ED Triage Notes (Signed)
Presents with psychotic behavior and agitation.

## 2020-03-26 NOTE — ED Triage Notes (Signed)
Patient arrived stating over the last 7 hours her throat has been sore and had a headache. Patient states she has "all the covid-19 symptoms, my fever was 107 at home" states she did not take anything at home today. Patient non febrile in triage (98.3)  In no distress.

## 2020-03-26 NOTE — ED Notes (Signed)
MD stated that the subject was good with vitals and ok for discharge. Pt non compliant.  Pt would not get out of bed and get dressed to leave. Pt covered her head and would not respond to instructions. Security and [employer] called

## 2020-03-26 NOTE — ED Notes (Signed)
Pt refusing blood work 

## 2020-03-26 NOTE — ED Notes (Signed)
Pt acutely psychotic, refused skin search, took PO meds and sandwich meal given.,

## 2020-03-26 NOTE — Discharge Instructions (Addendum)
You were evaluated in the Emergency Department and after careful evaluation, we did not find any emergent condition requiring admission or further testing in the hospital.  Your exam/testing today is overall reassuring.  Your symptoms seem to be due to a viral illness, possibly the coronavirus.  We have tested you for the coronavirus here in the Emergency Department.  Please isolate or quarantine at home until you receive a negative test result.  If positive, we recommend continued home quarantine per [facility] recommendations.   Please return to the Emergency Department if you experience any worsening of your condition.   Thank you for allowing [place] to be a part of your care.
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
## 2020-03-26 NOTE — ED Provider Notes (Signed)
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 YELLOW  YELLOW Final   APPearance 10/31/2019 CLEAR  CLEAR Final   Specific Gravity, Urine 10/31/2019 1.017  1.005 - 1.030 Final   pH 10/31/2019 8.0  5.0 - 8.0 Final   Glucose, UA 10/31/2019 NEGATIVE  NEGATIVE mg/dL Final   Hgb urine dipstick 10/31/2019 SMALL* NEGATIVE Final   Bilirubin Urine 10/31/2019 NEGATIVE  NEGATIVE Final   Ketones, ur 10/31/2019 NEGATIVE  NEGATIVE mg/dL Final   Protein, ur 95/62/1308 NEGATIVE  NEGATIVE mg/dL Final   Nitrite 65/78/4696 NEGATIVE  NEGATIVE Final   Leukocytes,Ua 10/31/2019 NEGATIVE  NEGATIVE Final   RBC / HPF 10/31/2019 6-10  0 - 5 RBC/hpf Final   WBC, UA 10/31/2019 0-5  0 - 5 WBC/hpf Final   Bacteria, UA 10/31/2019 NONE SEEN  NONE SEEN Final   Squamous Epithelial / LPF 10/31/2019 6-10  0 - 5 Final   Performed at [facility], [address] [place], [place] [address]   I-stat hCG, quantitative 10/31/2019 <5.0  <5 mIU/mL Final   Comment 3 10/31/2019          Final   Comment:   GEST. AGE      CONC.  (mIU/mL)   <=1 WEEK        5 - 50     2 WEEKS       50 - 500     3 WEEKS       100 - 10,000     4 WEEKS     1,000 - 30,000        FEMALE AND NON-PREGNANT FEMALE:     LESS THAN 5 mIU/mL     Allergies: Naproxen  PTA Medications: (Not in a hospital admission)   Medical Decision Making   Risperdal 2 mg PO x 1 dose for mood stability/auditory hallucinations Ativan 1 mg x 1 dose for anxiety Hydroxyzine 25 mg TID prn for anxiety Trazodone 50 mg QHS prn  Clinical Course as of Mar 27 245  Sat Mar 27, 2020  0158 K+ 3.2, replace with oral potassium 40 mEq x 1 dose. CMP otherwise unremarkable  Potassium(!): 3.2 [JB]  0159 HGB 10.7, consistent with previus results  Hemoglobin(!): 10.7 [JB]  0159 HCT(!): 34.2 [JB]  0159 MCV(!):  75.3 [JB]  0159 MCH(!): 23.6 [JB]  0200 Hemoglobin A1C: 4.9 [JB]  0201 Lipid Panel unremarkable  Lipid panel [JB]  0201 TSH: 1.998 [JB]    Clinical Course User Index [JB] Jackelyn Poling, NP    Recommendations  Based on my evaluation the patient does not appear to have an emergency medical condition.   Patient will be placed in the continuous assessment area at [facility] for treatment and stabilization. She will be reevaluated on 03/27/2020. The treatment team will determine disposition at that time.      Jackelyn Poling, NP 03/27/20  2:46 AM

## 2020-03-26 NOTE — MAU Note (Signed)
Pt called, not in lobby to complete triage

## 2020-03-26 NOTE — Discharge Instructions (Signed)
Follow up with your Physician  

## 2020-03-26 NOTE — ED Provider Notes (Signed)
[facility] EMERGENCY DEPARTMENT Provider Note   CSN: [id number] Arrival date & time: 03/26/20  0554     History Chief Complaint  Patient presents with  . Vaginal Bleeding  . Abdominal Pain    Debra Sullivan is a 34 y.o. female.  Pt refuses to give me any history except that she is bleeding.  Pt screamed at Nurse and I to leave her alone.  Pt refuses pelvic.  Pt refuses blood draw.   The history is provided by the patient. No language interpreter was used.  Vaginal Bleeding Severity:  Unable to specify Associated symptoms: abdominal pain   Abdominal Pain Associated symptoms: vaginal bleeding        Past Medical History:  Diagnosis Date  . Anemia   . Drug abuse (HCC)   . Psychosis (HCC) 2013    Patient Active Problem List   Diagnosis Date Noted  . Bipolar I disorder, most recent episode (or current) manic (HCC) 06/14/2018  . VBAC, delivered, current hospitalization 01/22/2018  . Supervision of high-risk pregnancy 01/21/2018  . LGA (large for gestational age) fetus affecting management of mother, third trimester, fetus 1 01/08/2018  . Anemia during pregnancy in third trimester 11/28/2017  . Drug dependence affecting pregnancy in third trimester   . Suspected fetal anomaly, antepartum   . Trichomonosis 08/01/2017  . Not immune to rubella 08/01/2017  . Supervision of other normal pregnancy, antepartum 07/11/2017  . History of cesarean section complicating pregnancy 07/11/2017  . Unwanted fertility 07/11/2017  . 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 Cannabis use disorder, severe, dependence (HCC) 04/15/2015  . PTSD (post-traumatic stress disorder) 04/15/2015  . Cocaine use disorder, moderate, in early remission (HCC) 04/15/2015  . Psychosis (HCC) 04/14/2015    Past Surgical History:  Procedure Laterality Date  . CESAREAN SECTION  03/2016   pLTCS for twin B at [facility]  . LAPAROSCOPY  N/A 04/14/2017   Procedure: LAPAROSCOPY OPERATIVE WITH RIGHT SALPINGECTOMY;  Surgeon: Conan Bowens, MD;  Location: [facility];  Service: Gynecology;  Laterality: N/A;  . TUBAL LIGATION Bilateral 01/22/2018   Procedure: POST PARTUM TUBAL LIGATION;  Surgeon: [name], DO;  Location: [facility]IRTH[facility];  Service: Gynecology;  Laterality: Bilateral;     OB History    Gravida  6   Para  5   Term  4   Preterm  1   AB  1   Living  6     SAB  0   TAB      Ectopic  1   Multiple  1   Live Births  6           Family History  Problem Relation Age of Onset  . Diabetes Mother   . Schizophrenia Mother   . Diabetes Brother   . Hypertension Maternal Aunt   . Healthy Father     Social History   Tobacco Use  . Smoking status: Current Every Day Smoker    Packs/day: 0.30    Types: Cigarettes  . Smokeless tobacco: Never Used  Vaping Use  . Vaping Use: Never used  Substance Use Topics  . Alcohol use: Yes    Alcohol/week: 0.0 standard drinks    Comment: socially  . Drug use: Yes    Types: Marijuana, Cocaine    Comment: Cocaine & Marijuana was used10/26/2018    Home Medications Prior to Admission medications   Medication Sig Start Date End Date  Taking? Authorizing Provider  ibuprofen (ADVIL) 800 MG tablet Take 1 tablet (800 mg total) by mouth 3 (three) times daily. 07/15/19   Mardella Layman, MD  metroNIDAZOLE (FLAGYL) 500 MG tablet Take 1 tablet (500 mg total) by mouth 2 (two) times daily. 01/23/20   Cathie Hoops, Amy V, PA-C    Allergies    Naproxen  Review of Systems   Review of Systems  Gastrointestinal: Positive for abdominal pain.  Genitourinary: Positive for vaginal bleeding.  All other systems reviewed and are negative.   Physical Exam Updated Vital Signs BP 113/66   Pulse 72   Temp 98 F (36.7 C)   Resp 16   Ht 5\' 5"  (1.651 m)   Wt 54.4 kg   SpO2 98%   BMI 19.97 kg/m   Physical Exam Vitals and nursing note reviewed.  Constitutional:      Appearance:  She is well-developed.  HENT:     Head: Normocephalic.  Cardiovascular:     Rate and Rhythm: Normal rate.  Pulmonary:     Effort: Pulmonary effort is normal.  Abdominal:     General: Bowel sounds are normal.  Musculoskeletal:        General: Normal range of motion.     Cervical back: Normal range of motion.  Neurological:     Mental Status: She is alert and oriented to person, place, and time.     ED Results / Procedures / Treatments   Labs (all labs ordered are listed, but only abnormal results are displayed) Labs Reviewed  CBC WITH DIFFERENTIAL/PLATELET  I-STAT BETA HCG BLOOD, ED ([facility], [facility], [facility] ONLY)    EKG None  Radiology No results found.  Procedures Procedures (including critical care time)  Medications Ordered in ED Medications - No data to display  ED Course  I have reviewed the triage vital signs and the nursing notes.  Pertinent labs & imaging results that were available during my care of the patient were reviewed by me and considered in my medical decision making (see chart for details).    MDM Rules/Calculators/A&P                          MDM: Pt seen here earlier.  Pt has been abusive with staff. Security at bedside  Final Clinical Impression(s) / ED Diagnoses Final diagnoses:  Vaginal bleeding    Rx / DC Orders ED Discharge Orders    None       03/26/20 05/26/20    [id number], MD 03/26/20 1954

## 2020-03-27 ENCOUNTER — Inpatient Hospital Stay (HOSPITAL_COMMUNITY)
Admission: AD | Admit: 2020-03-27 | Discharge: 2020-03-29 | DRG: 885 | Disposition: A | Discharge status: Home / Self Care | Payer: Medicaid Other | Source: Intra-hospital | Attending: Psychiatry | Admitting: Psychiatry

## 2020-03-27 DIAGNOSIS — Z818 Family history of other mental and behavioral disorders: Secondary | ICD-10-CM

## 2020-03-27 DIAGNOSIS — F17210 Nicotine dependence, cigarettes, uncomplicated: Secondary | ICD-10-CM | POA: Diagnosis present

## 2020-03-27 DIAGNOSIS — F209 Schizophrenia, unspecified: Secondary | ICD-10-CM | POA: Diagnosis present

## 2020-03-27 DIAGNOSIS — F3160 Bipolar disorder, current episode mixed, unspecified: Secondary | ICD-10-CM | POA: Diagnosis present

## 2020-03-27 DIAGNOSIS — Z8249 Family history of ischemic heart disease and other diseases of the circulatory system: Secondary | ICD-10-CM | POA: Diagnosis not present

## 2020-03-27 DIAGNOSIS — F1420 Cocaine dependence, uncomplicated: Secondary | ICD-10-CM | POA: Diagnosis present

## 2020-03-27 DIAGNOSIS — Z833 Family history of diabetes mellitus: Secondary | ICD-10-CM | POA: Diagnosis not present

## 2020-03-27 DIAGNOSIS — F1994 Other psychoactive substance use, unspecified with psychoactive substance-induced mood disorder: Secondary | ICD-10-CM | POA: Diagnosis not present

## 2020-03-27 DIAGNOSIS — Z5900 Homelessness unspecified: Secondary | ICD-10-CM | POA: Diagnosis not present

## 2020-03-27 DIAGNOSIS — F312 Bipolar disorder, current episode manic severe with psychotic features: Secondary | ICD-10-CM | POA: Diagnosis present

## 2020-03-27 DIAGNOSIS — Z886 Allergy status to analgesic agent status: Secondary | ICD-10-CM | POA: Diagnosis not present

## 2020-03-27 DIAGNOSIS — F1220 Cannabis dependence, uncomplicated: Secondary | ICD-10-CM | POA: Diagnosis present

## 2020-03-27 DIAGNOSIS — R440 Auditory hallucinations: Secondary | ICD-10-CM | POA: Diagnosis present

## 2020-03-27 DIAGNOSIS — Z79899 Other long term (current) drug therapy: Secondary | ICD-10-CM

## 2020-03-27 DIAGNOSIS — F319 Bipolar disorder, unspecified: Secondary | ICD-10-CM | POA: Diagnosis present

## 2020-03-27 LAB — LIPID PANEL
Cholesterol: 172 mg/dL (ref 0–200)
HDL: 67 mg/dL (ref >40)
LDL Cholesterol: 94 mg/dL (ref 0–99)
Total CHOL/HDL Ratio: 2.6 RATIO
Triglycerides: 55 mg/dL (ref <150)
VLDL: 11 mg/dL (ref 0–40)

## 2020-03-27 LAB — COMPREHENSIVE METABOLIC PANEL
ALT: 14 U/L (ref 0–44)
AST: 16 U/L (ref 15–41)
Albumin: 3.9 g/dL (ref 3.5–5.0)
Alkaline Phosphatase: 38 U/L (ref 38–126)
Anion gap: 10 (ref 5–15)
BUN: 8 mg/dL (ref 6–20)
CO2: 22 mmol/L (ref 22–32)
Calcium: 8.9 mg/dL (ref 8.9–10.3)
Chloride: 106 mmol/L (ref 98–111)
Creatinine, Ser: 0.87 mg/dL (ref 0.44–1.00)
GFR, Estimated: >60 mL/min (ref >60)
Glucose, Bld: 84 mg/dL (ref 70–99)
Potassium: 3.2 mmol/L — ABNORMAL LOW (ref 3.5–5.1)
Sodium: 138 mmol/L (ref 135–145)
Total Bilirubin: 1.1 mg/dL (ref 0.3–1.2)
Total Protein: 7 g/dL (ref 6.5–8.1)

## 2020-03-27 LAB — POCT PREGNANCY, URINE: Preg Test, Ur: NEGATIVE

## 2020-03-27 LAB — RESPIRATORY PANEL BY RT PCR (FLU A&B, COVID)
Influenza A by PCR: NEGATIVE
Influenza B by PCR: NEGATIVE
SARS Coronavirus 2 by RT PCR: NEGATIVE

## 2020-03-27 LAB — TSH: TSH: 1.998 u[IU]/mL (ref 0.350–4.500)

## 2020-03-27 MED ORDER — ALUM & MAG HYDROXIDE-SIMETH 200-200-20 MG/5ML PO SUSP: 30.0000 mL | ORAL | Status: DC | PRN

## 2020-03-27 MED ORDER — POTASSIUM CHLORIDE CRYS ER 20 MEQ PO TBCR
40.0000 meq | EXTENDED_RELEASE_TABLET | Freq: Once | ORAL | Status: AC
  Administered 2020-03-27: 40 meq via ORAL
  Filled 2020-03-27: qty 2

## 2020-03-27 MED ORDER — MAGNESIUM HYDROXIDE 400 MG/5ML PO SUSP: 30.0000 mL | Freq: Every day | ORAL | Status: DC | PRN

## 2020-03-27 MED ORDER — ACETAMINOPHEN 325 MG PO TABS: 650.0000 mg | ORAL_TABLET | Freq: Four times a day (QID) | ORAL | Status: DC | PRN

## 2020-03-27 MED ORDER — NICOTINE 21 MG/24HR TD PT24
21.0000 mg | MEDICATED_PATCH | Freq: Once | TRANSDERMAL | Status: DC
  Administered 2020-03-27: 21 mg via TRANSDERMAL
  Filled 2020-03-27: qty 1

## 2020-03-27 MED ORDER — TRAZODONE HCL 50 MG PO TABS
50.0000 mg | ORAL_TABLET | Freq: Every evening | ORAL | Status: DC | PRN
  Administered 2020-03-27 – 2020-03-28 (×2): 50 mg via ORAL
  Filled 2020-03-27 (×2): qty 1

## 2020-03-27 MED ORDER — RISPERIDONE 2 MG PO TBDP
2.0000 mg | ORAL_TABLET | Freq: Every day | ORAL | Status: DC
  Administered 2020-03-27: 2 mg via ORAL
  Filled 2020-03-27 (×3): qty 1

## 2020-03-27 MED ORDER — HYDROXYZINE HCL 25 MG PO TABS
25.0000 mg | ORAL_TABLET | Freq: Three times a day (TID) | ORAL | Status: DC | PRN
  Administered 2020-03-27 – 2020-03-28 (×2): 25 mg via ORAL
  Filled 2020-03-27 (×2): qty 1

## 2020-03-27 NOTE — ED Notes (Signed)
Pt sleeping @this  time. No c/o of pain or distress noted. Breathing even and unlabored will continue to monitor pt for saftey

## 2020-03-27 NOTE — Discharge Instructions (Addendum)
Transfer to [facility] 

## 2020-03-27 NOTE — ED Notes (Signed)
Patient is resting on bed with eyes closed. Respirations even and non labored. No distress noted. Monitoring continues.

## 2020-03-27 NOTE — ED Notes (Signed)
Spoke with [facility] and she has requested for [place] to hold off for awhile for sending pt to [facility] due to short staff @ this time. Nurse will call back when they are ready for pt to be transferred

## 2020-03-27 NOTE — ED Notes (Signed)
Patient  Arouses when name is called.Patient denies SI/HI and A/V/H. Patient engages in conversation minimal with this Clinical research associate. Patient given support and encouragement. Monitoring continues.

## 2020-03-27 NOTE — ED Notes (Signed)
pt resting @this  time. No c/o of pain or distress noted. Breathing even and unlabored will continue to monitor for safety

## 2020-03-27 NOTE — ED Notes (Signed)
Called to [facility] to give report to Rn  on 400 she was doing admission of a pt and said she will call back and get report when done

## 2020-03-27 NOTE — ED Provider Notes (Signed)
Behavioral Health Progress Note  Date and Time: 03/27/2020 2:40 PM Name: Debra Sullivan MRN:  [id number]  Subjective: Patient states "I do not like it when you ask me all these questions."  Patient assessed by nurse practitioner.  Patient appears awake with irritable mood.  Patient appears mildly paranoid and guarded when attempting to answer assessment questions.  Patient alert and oriented, initially cooperative during assessment.  Patient denies suicidal and homicidal ideations.  Patient currently denies hallucinations.  Patient noted to have delayed responses.  Patient continues to endorse paranoid delusion that she has been stabbed.  Patient denies any physical pain.  Patient cooperative and pleasant, offered reassurance.  Patient reports she lives alone in [place].  Patient denies access to weapons.  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  Patient denies alcohol and substance use.  Patient endorses she has been diagnosed with bipolar disorder in the past but currently not compliant with medications.  Patient gives verbal consent to speak with her mother, Youlanda Mighty.  Unable to locate contact information for patient's mother and medical record.  Patient offered support and encouragement.  Diagnosis:  Final diagnoses:  Bipolar disorder with psychotic features (HCC)  Hypokalemia    Total Time spent with patient: 20 minutes  Past Psychiatric History: Bipolar 1 disorder, major depressive disorder, cocaine use disorder, cannabis use disorder, PTSD Past Medical History:  Past Medical History:  Diagnosis Date  . Anemia   . Drug abuse (HCC)   . Psychosis (HCC) 2013    Past Surgical History:  Procedure Laterality Date  . CESAREAN SECTION  03/2016   [facility]LTCS for twin B at [facility]  . LAPAROSCOPY N/A 04/14/2017   Procedure: LAPAROSCOPY OPERATIVE WITH RIGHT SALPINGECTOMY;  Surgeon: Conan Bowens, MD;  Location: [facility];  Service: Gynecology;  Laterality:  N/A;  . TUBAL LIGATION Bilateral 01/22/2018   Procedure: POST PARTUM TUBAL LIGATION;  Surgeon: [name], DO;  Location: [facility];  Service: Gynecology;  Laterality: Bilateral;   Family History:  Family History  Problem Relation Age of Onset  . Diabetes Mother   . Schizophrenia Mother   . Diabetes Brother   . Hypertension Maternal Aunt   . Healthy Father    Family Psychiatric  History: None reported Social History:  Social History   Substance and Sexual Activity  Alcohol Use Yes  . Alcohol/week: 0.0 standard drinks   Comment: socially     Social History   Substance and Sexual Activity  Drug Use Yes  . Types: Marijuana, Cocaine   Comment: Cocaine & Marijuana was used10/26/2018    Social History   Socioeconomic History  . Marital status: Single    Spouse name: Not on file  . Number of children: Not on file  . Years of education: Not on file  . Highest education level: Not on file  Occupational History  . Not on file  Tobacco Use  . Smoking status: Current Every Day Smoker    Packs/day: 0.30    Types: Cigarettes  . Smokeless tobacco: Never Used  Vaping Use  . Vaping Use: Never used  Substance and Sexual Activity  . Alcohol use: Yes    Alcohol/week: 0.0 standard drinks    Comment: socially  . Drug use: Yes    Types: Marijuana, Cocaine    Comment: Cocaine & Marijuana was used10/26/2018  . Sexual activity: Yes    Birth control/protection: None  Other Topics Concern  . Not on file  Social History Narrative  . Not on file   Social Determinants of Health   Financial Resource Strain:   . Difficulty of Paying Living Expenses: Not on file  Food Insecurity:   . Worried About Programme researcher, broadcasting/film/video in the Last Year: Not on file  . Ran Out of Food in the Last Year: Not on file  Transportation Needs:   . Lack of Transportation (Medical): Not on file  . Lack of Transportation (Non-Medical): Not on file  Physical Activity:   . Days of Exercise per  Week: Not on file  . Minutes of Exercise per Session: Not on file  Stress:   . Feeling of Stress : Not on file  Social Connections:   . Frequency of Communication with Friends and Family: Not on file  . Frequency of Social Gatherings with Friends and Family: Not on file  . Attends Religious Services: Not on file  . Active Member of Clubs or Organizations: Not on file  . Attends Banker Meetings: Not on file  . Marital Status: Not on file   SDOH:  SDOH Screenings   Alcohol Screen:   . Last Alcohol Screening Score (AUDIT): Not on file  Depression (PHQ2-9): Medium Risk  . PHQ-2 Score: 19  Financial Resource Strain:   . Difficulty of Paying Living Expenses: Not on file  Food Insecurity:   . Worried About Programme researcher, broadcasting/film/video in the Last Year: Not on file  . Ran Out of Food in the Last Year: Not on file  Housing:   . Last Housing Risk Score: Not on file  Physical Activity:   . Days of Exercise per Week: Not on file  . Minutes of Exercise per Session: Not on file  Social Connections:   . Frequency of Communication with Friends and Family: Not on file  . Frequency of Social Gatherings with Friends and Family: Not on file  . Attends Religious Services: Not on file  . Active Member of Clubs or Organizations: Not on file  . Attends Banker Meetings: Not on file  . Marital Status: Not on file  Stress:   . Feeling of Stress : Not on file  Tobacco Use: High Risk  . Smoking Tobacco Use: Current Every Day Smoker  . Smokeless Tobacco Use: Never Used  Transportation Needs:   . Freight forwarder (Medical): Not on file  . Lack of Transportation (Non-Medical): Not on file   Additional Social History:    Pain Medications: None Prescriptions: None Over the Counter: None History of alcohol / drug use?: No history of alcohol / drug abuse Longest period of sobriety (when/how long): NA                    Sleep: Fair  Appetite:  Poor  Current  Medications:  Current Facility-Administered Medications  Medication Dose Route Frequency Provider Last Rate Last Admin  . acetaminophen (TYLENOL) tablet 650 mg  650 mg Oral Q6H PRN Jackelyn Poling, NP      . alum & mag hydroxide-simeth (MAALOX/MYLANTA) 200-200-20 MG/5ML suspension 30 mL  30 mL Oral Q4H PRN Nira Conn A, NP      . hydrOXYzine (ATARAX/VISTARIL) tablet 25 mg  25 mg Oral TID PRN Nira Conn A, NP      . magnesium hydroxide (MILK OF MAGNESIA) suspension 30 mL  30 mL Oral Daily PRN Nira Conn A, NP      . traZODone (DESYREL) tablet 50 mg  50 mg Oral QHS PRN Jackelyn Poling, NP       Current Outpatient Medications  Medication Sig Dispense Refill  . cycloSPORINE (RESTASIS) 0.05 % ophthalmic emulsion 1 drop 2 (two) times daily.    . naphazoline-glycerin (CLEAR EYES REDNESS) 0.012-0.2 % SOLN 1-2 drops 4 (four) times daily as needed for eye irritation.    Marland Kitchen ibuprofen (ADVIL) 800 MG tablet Take 1 tablet (800 mg total) by mouth 3 (three) times daily. (Patient not taking: Reported on 03/27/2020) 21 tablet 0  . metroNIDAZOLE (FLAGYL) 500 MG tablet Take 1 tablet (500 mg total) by mouth 2 (two) times daily. (Patient not taking: Reported on 03/27/2020) 14 tablet 0    Labs  Lab Results:  Admission on 03/26/2020  Component Date Value Ref Range Status  . SARS Coronavirus 2 by RT PCR 03/26/2020 NEGATIVE  NEGATIVE Final   Comment: (NOTE) SARS-CoV-2 target nucleic acids are NOT DETECTED.  The SARS-CoV-2 RNA is generally detectable in upper respiratoy specimens during the acute phase of infection. The lowest concentration of SARS-CoV-2 viral copies this assay can detect is 131 copies/mL. A negative result does not preclude SARS-Cov-2 infection and should not be used as the sole basis for treatment or other patient management decisions. A negative result may occur with  improper specimen collection/handling, submission of specimen other than nasopharyngeal swab, presence of viral mutation(s)  within the areas targeted by this assay, and inadequate number of viral copies (<131 copies/mL). A negative result must be combined with clinical observations, patient history, and epidemiological information. The expected result is Negative.  Fact Sheet for Patients:  https://www.moore.com/  Fact Sheet for Healthcare Providers:  https://www.young.biz/  This test is no                          t yet approved or cleared by the [place] FDA and  has been authorized for detection and/or diagnosis of SARS-CoV-2 by FDA under an Emergency Use Authorization (EUA). This EUA will remain  in effect (meaning this test can be used) for the duration of the COVID-19 declaration under Section 564(b)(1) of the Act, 21 U.S.C. section 360bbb-3(b)(1), unless the authorization is terminated or revoked sooner.    . Influenza A by PCR 03/26/2020 NEGATIVE  NEGATIVE Final  . Influenza B by PCR 03/26/2020 NEGATIVE  NEGATIVE Final   Comment: (NOTE) The Xpert Xpress SARS-CoV-2/FLU/RSV assay is intended as an aid in  the diagnosis of influenza from Nasopharyngeal swab specimens and  should not be used as a sole basis for treatment. Nasal washings and  aspirates are unacceptable for Xpert Xpress SARS-CoV-2/FLU/RSV  testing.  Fact Sheet for Patients: https://www.moore.com/  Fact Sheet for Healthcare Providers: https://www.young.biz/  This test is not yet approved or cleared by the [place] FDA and  has been authorized for detection and/or diagnosis of SARS-CoV-2 by  FDA under an Emergency Use Authorization (EUA). This EUA will remain  in effect (meaning this test can be used) for the duration of the  Covid-19 declaration under Section 564(b)(1) of the Act, 21  U.S.C. section 360bbb-3(b)(1), unless the authorization is  terminated or revoked. Performed at [facility], [address], [place],  [place] [address]   . SARS Coronavirus 2 Ag 03/26/2020 Negative  Negative Preliminary  . WBC 03/26/2020 7.4  4.0 - 10.5 K/uL Final  . RBC 03/26/2020 4.54  3.87 - 5.11 MIL/uL Final  . Hemoglobin 03/26/2020 10.7* 12.0 - 15.0 g/dL Final  .  HCT 03/26/2020 34.2* 36 - 46 % Final  . MCV 03/26/2020 75.3* 80.0 - 100.0 fL Final  . MCH 03/26/2020 23.6* 26.0 - 34.0 pg Final  . MCHC 03/26/2020 31.3  30.0 - 36.0 g/dL Final  . RDW 62/83/1517 15.2  11.5 - 15.5 % Final  . Platelets 03/26/2020 313  150 - 400 K/uL Final  . nRBC 03/26/2020 0.0  0.0 - 0.2 % Final  . Neutrophils Relative % 03/26/2020 44  % Final  . Neutro Abs 03/26/2020 3.3  1.7 - 7.7 K/uL Final  . Lymphocytes Relative 03/26/2020 43  % Final  . Lymphs Abs 03/26/2020 3.2  0.7 - 4.0 K/uL Final  . Monocytes Relative 03/26/2020 8  % Final  . Monocytes Absolute 03/26/2020 0.6  0.1 - 1.0 K/uL Final  . Eosinophils Relative 03/26/2020 4  % Final  . Eosinophils Absolute 03/26/2020 0.3  0 - 0 K/uL Final  . Basophils Relative 03/26/2020 1  % Final  . Basophils Absolute 03/26/2020 0.0  0 - 0 K/uL Final  . Immature Granulocytes 03/26/2020 0  % Final  . Abs Immature Granulocytes 03/26/2020 0.02  0.00 - 0.07 K/uL Final   Performed at [facility], [address], [place], [place] [address]  . Sodium 03/26/2020 138  135 - 145 mmol/L Final  . Potassium 03/26/2020 3.2* 3.5 - 5.1 mmol/L Final  . Chloride 03/26/2020 106  98 - 111 mmol/L Final  . CO2 03/26/2020 22  22 - 32 mmol/L Final  . Glucose, Bld 03/26/2020 84  70 - 99 mg/dL Final   Glucose reference range applies only to samples taken after fasting for at least 8 hours.  . BUN 03/26/2020 8  6 - 20 mg/dL Final  . Creatinine, Ser 03/26/2020 0.87  0.44 - 1.00 mg/dL Final  . Calcium 37/03/6268 8.9  8.9 - 10.3 mg/dL Final  . Total Protein 03/26/2020 7.0  6.5 - 8.1 g/dL Final  . Albumin 48/54/6270 3.9  3.5 - 5.0 g/dL Final  . AST 35/00/9381 16  15 - 41 U/L Final  . ALT 03/26/2020 14  0 - 44 U/L Final  .  Alkaline Phosphatase 03/26/2020 38  38 - 126 U/L Final  . Total Bilirubin 03/26/2020 1.1  0.3 - 1.2 mg/dL Final  . GFR, Estimated 03/26/2020 >60  >60 mL/min Final  . Anion gap 03/26/2020 10  5 - 15 Final   Performed at [facility], [address], [place], [place] [address]  . Hgb A1c MFr Bld 03/26/2020 4.9  4.8 - 5.6 % Final   Comment: (NOTE) Pre diabetes:          5.7%-6.4%  Diabetes:              >6.4%  Glycemic control for   <7.0% adults with diabetes   . Mean Plasma Glucose 03/26/2020 93.93  mg/dL Final   Performed at [facility], [address], [place], [place] [address]  . Cholesterol 03/26/2020 172  0 - 200 mg/dL Final  . Triglycerides 03/26/2020 55  <150 mg/dL Final  . HDL 78/93/8101 67  >40 mg/dL Final  . Total CHOL/HDL Ratio 03/26/2020 2.6  RATIO Final  . VLDL 03/26/2020 11  0 - 40 mg/dL Final  . LDL Cholesterol 03/26/2020 94  0 - 99 mg/dL Final   Comment:        Total Cholesterol/HDL:CHD Risk Coronary Heart Disease Risk Table  Men   Women  1/2 Average Risk   3.4   3.3  Average Risk       5.0   4.4  2 X Average Risk   9.6   7.1  3 X Average Risk  23.4   11.0        Use the calculated Patient Ratio above and the CHD Risk Table to determine the patient's CHD Risk.        ATP III CLASSIFICATION (LDL):  <100     mg/dL   Optimal  [id number]  mg/dL   Near or Above                    Optimal  130-159  mg/dL   Borderline  [id number]  mg/dL   High  >784>190     mg/dL   Very High Performed at [facility], [address] [place], [place] [address]   . TSH 03/26/2020 1.998  0.350 - 4.500 uIU/mL Final   Comment: Performed by a 3rd Generation assay with a functional sensitivity of <=0.01 uIU/mL. Performed at [facility], [address] [place], [place] [address]   . SARS Coronavirus 2 Ag 03/26/2020 NEGATIVE  NEGATIVE Final   Comment: (NOTE) SARS-CoV-2 antigen NOT DETECTED.   Negative results are presumptive.  Negative results do  not preclude SARS-CoV-2 infection and should not be used as the sole basis for treatment or other patient management decisions, including infection  control decisions, particularly in the presence of clinical signs and  symptoms consistent with COVID-19, or in those who have been in contact with the virus.  Negative results must be combined with clinical observations, patient history, and epidemiological information. The expected result is Negative.  Fact Sheet for Patients: https://sanders-williams.net/https://www.fda.gov/media/139754/download  Fact Sheet for Healthcare Providers: https://martinez.com/https://www.fda.gov/media/139753/download   This test is not yet approved or cleared by the [place] FDA and  has been authorized for detection and/or diagnosis of SARS-CoV-2 by FDA under an Emergency Use Authorization (EUA).  This EUA will remain in effect (meaning this test can be used) for the duration of  the C                          OVID-19 declaration under Section 564(b)(1) of the Act, 21 U.S.C. section 360bbb-3(b)(1), unless the authorization is terminated or revoked sooner.    Admission on 03/26/2020, Discharged on 03/26/2020  Component Date Value Ref Range Status  . Preg Test, Ur 03/26/2020 NEGATIVE  NEGATIVE Final   Comment:        THE SENSITIVITY OF THIS METHODOLOGY IS >24 mIU/mL   Admission on 01/23/2020, Discharged on 01/23/2020  Component Date Value Ref Range Status  . Color, UA 01/23/2020 yellow  yellow Final  . Clarity, UA 01/23/2020 clear  clear Final  . Glucose, UA 01/23/2020 negative  negative mg/dL Final  . Bilirubin, UA 01/23/2020 negative  negative Final  . Ketones, POC UA 01/23/2020 negative  negative mg/dL Final  . Spec Grav, UA 01/23/2020 >=1.030* 1.010 - 1.025 Final  . Blood, UA 01/23/2020 negative  negative Final  . pH, UA 01/23/2020 6.5  5.0 - 8.0 Final  . Protein Ur, POC 01/23/2020 negative  negative mg/dL Final  . Urobilinogen, UA 01/23/2020 1.0  0.2 or 1.0 E.U./dL Final  . Nitrite, UA  32/44/010208/11/2019 Negative  Negative Final  . Leukocytes, UA 01/23/2020 Negative  Negative Final  . Preg Test, Ur 01/23/2020 Negative  Negative Final  . Trichomonas 01/23/2020 Negative   Final  . Chlamydia 01/23/2020 Negative   Final  . Neisseria Gonorrhea 01/23/2020 Negative   Final  . Comment 01/23/2020 Normal Reference Ranger Chlamydia - Negative   Final  . Comment 01/23/2020 Normal Reference Range Trichomonas - Negative   Final  . Comment 01/23/2020 Normal Reference Range Neisseria Gonorrhea - Negative   Final  Admission on 01/19/2020, Discharged on 01/19/2020  Component Date Value Ref Range Status  . Lipase 01/19/2020 30  11 - 51 U/L Final   Performed at [facility], [address] [place], [place] [address]  . Sodium 01/19/2020 137  135 - 145 mmol/L Final  . Potassium 01/19/2020 3.5  3.5 - 5.1 mmol/L Final  . Chloride 01/19/2020 108  98 - 111 mmol/L Final  . CO2 01/19/2020 22  22 - 32 mmol/L Final  . Glucose, Bld 01/19/2020 92  70 - 99 mg/dL Final   Glucose reference range applies only to samples taken after fasting for at least 8 hours.  . BUN 01/19/2020 8  6 - 20 mg/dL Final  . Creatinine, Ser 01/19/2020 0.78  0.44 - 1.00 mg/dL Final  . Calcium 60/45/4098 9.1  8.9 - 10.3 mg/dL Final  . Total Protein 01/19/2020 7.2  6.5 - 8.1 g/dL Final  . Albumin 11/91/4782 3.7  3.5 - 5.0 g/dL Final  . AST 95/62/1308 18  15 - 41 U/L Final  . ALT 01/19/2020 19  0 - 44 U/L Final  . Alkaline Phosphatase 01/19/2020 38  38 - 126 U/L Final  . Total Bilirubin 01/19/2020 1.2  0.3 - 1.2 mg/dL Final  . GFR calc non Af Amer 01/19/2020 >60  >60 mL/min Final  . GFR calc Af Amer 01/19/2020 >60  >60 mL/min Final  . Anion gap 01/19/2020 7  5 - 15 Final   Performed at [facility], [address], [place], [place] [address]  . WBC 01/19/2020 10.2  4.0 - 10.5 K/uL Final  . RBC 01/19/2020 4.70  3.87 - 5.11 MIL/uL Final  . Hemoglobin 01/19/2020 10.8* 12.0 - 15.0 g/dL Final  . HCT 69/62/9528 34.9* 36 -  46 % Final  . MCV 01/19/2020 74.3* 80.0 - 100.0 fL Final  . MCH 01/19/2020 23.0* 26.0 - 34.0 pg Final  . MCHC 01/19/2020 30.9  30.0 - 36.0 g/dL Final  . RDW 41/32/4401 15.4  11.5 - 15.5 % Final  . Platelets 01/19/2020 419* 150 - 400 K/uL Final  . nRBC 01/19/2020 0.0  0.0 - 0.2 % Final   Performed at [facility], [address], [place], [place] [address]  . Color, Urine 01/19/2020 YELLOW  YELLOW Final  . APPearance 01/19/2020 CLEAR  CLEAR Final  . Specific Gravity, Urine 01/19/2020 1.016  1.005 - 1.030 Final  . pH 01/19/2020 5.0  5.0 - 8.0 Final  . Glucose, UA 01/19/2020 NEGATIVE  NEGATIVE mg/dL Final  . Hgb urine dipstick 01/19/2020 NEGATIVE  NEGATIVE Final  . Bilirubin Urine 01/19/2020 NEGATIVE  NEGATIVE Final  . Ketones, ur 01/19/2020 5* NEGATIVE mg/dL Final  . Protein, ur 36/64/4034 NEGATIVE  NEGATIVE mg/dL Final  . Nitrite 74/25/9563 NEGATIVE  NEGATIVE Final  . Glori Luis 01/19/2020 NEGATIVE  NEGATIVE Final   Performed at [facility], [address] [place], [place] [address]  . I-stat hCG, quantitative 01/19/2020 <5.0  <5 mIU/mL Final  . Comment 3 01/19/2020          Final  Comment:   GEST. AGE      CONC.  (mIU/mL)   <=1 WEEK        5 - 50     2 WEEKS       50 - 500     3 WEEKS       100 - 10,000     4 WEEKS     1,000 - 30,000        FEMALE AND NON-PREGNANT FEMALE:     LESS THAN 5 mIU/mL   Admission on 11/20/2019, Discharged on 11/20/2019  Component Date Value Ref Range Status  . Preg Test, Ur 11/20/2019 Negative  Negative Final  . Color, UA 11/20/2019 yellow  yellow Final  . Clarity, UA 11/20/2019 cloudy* clear Final  . Glucose, UA 11/20/2019 negative  negative mg/dL Final  . Bilirubin, UA 11/20/2019 negative  negative Final  . Ketones, POC UA 11/20/2019 negative  negative mg/dL Final  . Spec Grav, UA 11/20/2019 1.015  1.010 - 1.025 Final  . Blood, UA 11/20/2019 negative  negative Final  . pH, UA 11/20/2019 8.5* 5.0 - 8.0 Final  . Protein Ur, POC  11/20/2019 negative  negative mg/dL Final  . Urobilinogen, UA 11/20/2019 0.2  0.2 or 1.0 E.U./dL Final  . Nitrite, UA 16/03/9603 Negative  Negative Final  . Leukocytes, UA 11/20/2019 Large (3+)* Negative Final  . Neisseria Gonorrhea 11/20/2019 Positive*  Final  . Chlamydia 11/20/2019 Negative   Final  . Trichomonas 11/20/2019 Negative   Final  . Comment 11/20/2019 Normal Reference Range Trichomonas - Negative   Final  . Comment 11/20/2019 Normal Reference Ranger Chlamydia - Negative   Final  . Comment 11/20/2019 Normal Reference Range Neisseria Gonorrhea - Negative   Final  . Specimen Description 11/20/2019 URINE, RANDOM   Final  . Special Requests 11/20/2019 NONE   Final  . Culture 11/20/2019    Final                   Value:NO GROWTH Performed at [facility], [address] [place], [place] [address]   . Report Status 11/20/2019 11/22/2019 FINAL   Final  Admission on 10/31/2019, Discharged on 10/31/2019  Component Date Value Ref Range Status  . Lipase 10/31/2019 25  11 - 51 U/L Final   Performed at [facility], [address], [place], [place] [address]  . Sodium 10/31/2019 140  135 - 145 mmol/L Final  . Potassium 10/31/2019 3.9  3.5 - 5.1 mmol/L Final  . Chloride 10/31/2019 105  98 - 111 mmol/L Final  . CO2 10/31/2019 26  22 - 32 mmol/L Final  . Glucose, Bld 10/31/2019 90  70 - 99 mg/dL Final   Glucose reference range applies only to samples taken after fasting for at least 8 hours.  . BUN 10/31/2019 9  6 - 20 mg/dL Final  . Creatinine, Ser 10/31/2019 0.86  0.44 - 1.00 mg/dL Final  . Calcium 78/29/5621 9.3  8.9 - 10.3 mg/dL Final  . Total Protein 10/31/2019 7.3  6.5 - 8.1 g/dL Final  . Albumin 30/86/5784 3.8  3.5 - 5.0 g/dL Final  . AST 69/62/9528 19  15 - 41 U/L Final  . ALT 10/31/2019 17  0 - 44 U/L Final  . Alkaline Phosphatase 10/31/2019 41  38 - 126 U/L Final  . Total Bilirubin 10/31/2019 1.3* 0.3 - 1.2 mg/dL Final  . GFR calc non Af Amer 10/31/2019 >60  >60  mL/min Final  .  GFR calc Af Amer 10/31/2019 >60  >60 mL/min Final  . Anion gap 10/31/2019 9  5 - 15 Final   Performed at [facility], [address] [place], [place] [address]  . WBC 10/31/2019 5.8  4.0 - 10.5 K/uL Final  . RBC 10/31/2019 4.75  3.87 - 5.11 MIL/uL Final  . Hemoglobin 10/31/2019 11.2* 12.0 - 15.0 g/dL Final  . HCT 03/50/0938 36.7  36 - 46 % Final  . MCV 10/31/2019 77.3* 80.0 - 100.0 fL Final  . MCH 10/31/2019 23.6* 26.0 - 34.0 pg Final  . MCHC 10/31/2019 30.5  30.0 - 36.0 g/dL Final  . RDW 18/29/9371 14.9  11.5 - 15.5 % Final  . Platelets 10/31/2019 392  150 - 400 K/uL Final  . nRBC 10/31/2019 0.0  0.0 - 0.2 % Final   Performed at [facility], [address] [place], [place] [address]  . Color, Urine 10/31/2019 YELLOW  YELLOW Final  . APPearance 10/31/2019 CLEAR  CLEAR Final  . Specific Gravity, Urine 10/31/2019 1.017  1.005 - 1.030 Final  . pH 10/31/2019 8.0  5.0 - 8.0 Final  . Glucose, UA 10/31/2019 NEGATIVE  NEGATIVE mg/dL Final  . Hgb urine dipstick 10/31/2019 SMALL* NEGATIVE Final  . Bilirubin Urine 10/31/2019 NEGATIVE  NEGATIVE Final  . Ketones, ur 10/31/2019 NEGATIVE  NEGATIVE mg/dL Final  . Protein, ur 93/81/0175 NEGATIVE  NEGATIVE mg/dL Final  . Nitrite 04/12/8526 NEGATIVE  NEGATIVE Final  . Glori Luis 10/31/2019 NEGATIVE  NEGATIVE Final  . RBC / HPF 10/31/2019 6-10  0 - 5 RBC/hpf Final  . WBC, UA 10/31/2019 0-5  0 - 5 WBC/hpf Final  . Bacteria, UA 10/31/2019 NONE SEEN  NONE SEEN Final  . Squamous Epithelial / LPF 10/31/2019 6-10  0 - 5 Final   Performed at [facility], [address], [place], [place] [address]  . I-stat hCG, quantitative 10/31/2019 <5.0  <5 mIU/mL Final  . Comment 3 10/31/2019          Final   Comment:   GEST. AGE      CONC.  (mIU/mL)   <=1 WEEK        5 - 50     2 WEEKS       50 - 500     3 WEEKS       100 - 10,000     4 WEEKS     1,000 - 30,000        FEMALE AND NON-PREGNANT FEMALE:     LESS THAN 5 mIU/mL      Blood Alcohol level:  Lab Results  Component Value Date   ETH <10 06/13/2018   ETH <5 04/13/2015    Metabolic Disorder Labs: Lab Results  Component Value Date   HGBA1C 4.9 03/26/2020   MPG 93.93 03/26/2020   MPG 108 04/16/2015   Lab Results  Component Value Date   PROLACTIN 43.5 (H) 04/16/2015   Lab Results  Component Value Date   CHOL 172 03/26/2020   TRIG 55 03/26/2020   HDL 67 03/26/2020   CHOLHDL 2.6 03/26/2020   VLDL 11 03/26/2020   LDLCALC 94 03/26/2020    Therapeutic Lab Levels: No results found for: LITHIUM No results found for: VALPROATE No components found for:  CBMZ  Physical Findings   AIMS     Admission (Discharged) from [facility]) [facility]  AIMS Total Score 0 0    AUDIT     Ad[facility]) [facility] Admission (Discharged[facility]  Alcohol Use Disorder Identification Test Final Score (AUDIT) 1 0    GAD-7     Office Visit from 05/07/2017 in Center for [facility]  Total GAD-7 Score 0    PHQ2-9     ED from 03/26/2020 in [facility] Office Visit from 05/07/2017 in [facility]  PHQ-2 Total Score 6 0  PHQ-9 Total Score 19 0       Musculoskeletal  Strength & Muscle Tone: within normal limits Gait & Station: normal Patient leans: N/A  Psychiatric Specialty Exam  Presentation  General Appearance: Disheveled  Eye Contact:Fleeting  Speech:Clear and Coherent;Normal Rate  Speech Volume:Decreased  Handedness:Right   Mood and Affect  Mood:Depressed;Anxious  Affect:Flat   Thought Process  Thought Processes:Coherent  Descriptions of Associations:Intact  Orientation:Full (Time, Place and Person)  Thought  Content:Logical  Hallucinations:Hallucinations: Auditory Description of Auditory Hallucinations: states that she can somtimes understand the voices, but does not elaborate. Denies they are command in nature.  Ideas of Reference:No data recorded Suicidal Thoughts:Suicidal Thoughts: Yes, Passive SI Passive Intent and/or Plan: Without Intent;Without Plan  Homicidal Thoughts:Homicidal Thoughts: No   Sensorium  Memory:Immediate Fair;Recent Fair;Remote Fair  Judgment:Intact  Insight:Lacking   Executive Functions  Concentration:Fair  Attention Span:Fair  Recall:Fair  Fund of Knowledge:Fair  Language:Good   Psychomotor Activity  Psychomotor Activity:Psychomotor Activity: Normal   Assets  Assets:Desire for Improvement;Physical Health   Sleep  Sleep:Sleep: Poor   Physical Exam  Physical Exam Vitals and nursing note reviewed.  Constitutional:      Appearance: She is well-developed.  HENT:     Head: Normocephalic.  Cardiovascular:     Rate and Rhythm: Normal rate.  Pulmonary:     Effort: Pulmonary effort is normal.  Neurological:     Mental Status: She is alert and oriented to person, place, and time.  Psychiatric:        Attention and Perception: Attention normal.        Mood and Affect: Affect is labile.        Speech: Speech is delayed.        Behavior: Behavior is cooperative.        Thought Content: Thought content is paranoid and delusional.        Cognition and Memory: Cognition normal.        Judgment: Judgment is inappropriate.    Review of Systems  Constitutional: Negative.   HENT: Negative.   Eyes: Negative.   Respiratory: Negative.   Cardiovascular: Negative.   Gastrointestinal: Negative.   Genitourinary: Negative.   Musculoskeletal: Negative.   Skin: Negative.   Neurological: Negative.   Endo/Heme/Allergies: Negative.   Psychiatric/Behavioral: The patient is nervous/anxious.    Blood pressure 99/77, pulse (!) 103, temperature 97.7 F  (36.5 C), temperature source Tympanic, resp. rate 18, SpO2 100 %. There is no height or weight on file to calculate BMI.  Treatment Plan Summary: Plan Patient reviewed with Dr. Nelly Rout.   Medications include: -Risperdal 2 mg daily -Trazodone 50 mg nightly as needed/sleep -Vistaril 25 mg 3 times daily as needed/anxiety   Patient will be transferred to call behavioral health adult unit for inpatient psychiatric treatment, patient voluntary at this time reports willingness to continue inpatient treatment.  Patrcia Dolly, FNP 03/27/2020 2:40  PM

## 2020-03-27 NOTE — Progress Notes (Signed)
Pt accepted to [facility] to room 404-02 to the service of MD Clary at/after 2130 hours.  Report may be called to [phone] when transportation has been arranged.
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
## 2020-03-27 NOTE — ED Notes (Signed)
Patient resting in bed with eyes closed Respirations even and non labored no distress noted. Monitoring continues. °

## 2020-03-28 ENCOUNTER — Encounter (HOSPITAL_COMMUNITY): Payer: Self-pay | Admitting: Psychiatry

## 2020-03-28 ENCOUNTER — Other Ambulatory Visit: Payer: Self-pay

## 2020-03-28 DIAGNOSIS — F3160 Bipolar disorder, current episode mixed, unspecified: Secondary | ICD-10-CM | POA: Diagnosis not present

## 2020-03-28 DIAGNOSIS — F17210 Nicotine dependence, cigarettes, uncomplicated: Secondary | ICD-10-CM | POA: Diagnosis not present

## 2020-03-28 DIAGNOSIS — F209 Schizophrenia, unspecified: Secondary | ICD-10-CM | POA: Diagnosis not present

## 2020-03-28 DIAGNOSIS — F1220 Cannabis dependence, uncomplicated: Secondary | ICD-10-CM | POA: Diagnosis not present

## 2020-03-28 DIAGNOSIS — F1994 Other psychoactive substance use, unspecified with psychoactive substance-induced mood disorder: Secondary | ICD-10-CM | POA: Diagnosis not present

## 2020-03-28 MED ORDER — RISPERIDONE 1 MG PO TBDP
1.0000 mg | ORAL_TABLET | Freq: Every day | ORAL | Status: DC
  Administered 2020-03-28: 1 mg via ORAL
  Filled 2020-03-28 (×3): qty 1

## 2020-03-28 MED ORDER — POTASSIUM CHLORIDE CRYS ER 20 MEQ PO TBCR
20.0000 meq | EXTENDED_RELEASE_TABLET | Freq: Two times a day (BID) | ORAL | Status: AC
  Administered 2020-03-28 – 2020-03-29 (×2): 20 meq via ORAL
  Filled 2020-03-28 (×3): qty 1

## 2020-03-28 MED ORDER — NAPHAZOLINE-GLYCERIN 0.012-0.2 % OP SOLN
1.0000 [drp] | Freq: Four times a day (QID) | OPHTHALMIC | Status: DC | PRN
  Administered 2020-03-28 – 2020-03-29 (×3): 2 [drp] via OPHTHALMIC
  Filled 2020-03-28: qty 15
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
## 2020-03-28 NOTE — Progress Notes (Signed)
D:  Patient denied SI and HI, contracts for safety.  Denied A/V hallucinations.   A:  Medications administered per MD orders.  Emotional support and encouragement given patient. R:  Safety maintained with 15 minute checks.  
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
## 2020-03-28 NOTE — BHH Group Notes (Signed)
Adult Psychoeducational Group Not °Date:  03/28/2020 °Time:  0900-1045 °Group Topic/Focus: PROGRESSIVE RELAXATION. A group where deep breathing is taught and tensing and relaxation muscle groups is used. Imagery is used as well.  Pts are asked to imagine 3 pillars that hold them up when they are not able to hold themselves up. ° °Participation Level:  Did not attend ° ° °Kaitlin Alcindor A °03/28/2020 ° ° ° °

## 2020-03-29 LAB — RAPID URINE DRUG SCREEN, HOSP PERFORMED
Amphetamines: NOT DETECTED
Barbiturates: NOT DETECTED
Benzodiazepines: NOT DETECTED
Cocaine: POSITIVE — AB
Opiates: NOT DETECTED
Tetrahydrocannabinol: POSITIVE — AB

## 2020-03-29 MED ORDER — TRAZODONE HCL 50 MG PO TABS
50.0000 mg | ORAL_TABLET | Freq: Every evening | ORAL | 0 refills | Status: DC | PRN
Start: 2020-03-29 — End: 2020-03-29

## 2020-03-29 MED ORDER — RISPERIDONE 1 MG PO TBDP
1.0000 mg | ORAL_TABLET | Freq: Every day | ORAL | 0 refills | Status: DC
Start: 2020-03-29 — End: 2020-05-10

## 2020-03-29 MED ORDER — HYDROXYZINE HCL 25 MG PO TABS
25.0000 mg | ORAL_TABLET | Freq: Three times a day (TID) | ORAL | 0 refills | Status: DC | PRN
Start: 2020-03-29 — End: 2020-03-29

## 2020-03-29 MED ORDER — RISPERIDONE 1 MG PO TBDP
1.0000 mg | ORAL_TABLET | Freq: Every day | ORAL | 0 refills | Status: DC
Start: 2020-03-29 — End: 2020-03-29

## 2020-03-29 MED ORDER — TRAZODONE HCL 50 MG PO TABS
50.0000 mg | ORAL_TABLET | Freq: Every evening | ORAL | 0 refills | Status: DC | PRN
Start: 2020-03-29 — End: 2020-05-10

## 2020-03-29 MED ORDER — HYDROXYZINE HCL 25 MG PO TABS
25.0000 mg | ORAL_TABLET | Freq: Three times a day (TID) | ORAL | 0 refills | Status: DC | PRN
Start: 2020-03-29 — End: 2020-05-10

## 2020-03-29 NOTE — Progress Notes (Signed)
Pt has been calm and cooperative with care, observed in the dayroom watching TV with peers at the beginning of the shift. Pt took all her meds with no issues, has been in bed resting well . Will continue to monitor.

## 2020-03-29 NOTE — BHH Suicide Risk Assessment (Signed)
BHH INPATIENT:  Family/Significant Other Suicide Prevention Education  Suicide Prevention Education:  Patient unable to provide consent for Family/Significant Other Suicide Prevention Education: The patient has been unable to provide written consent for family/significant other to be provided Family/Significant Other Suicide Prevention Education during admission and/or prior to discharge. Physician notified.  SPE completed with patient, as patient has been unable to provide consent for family contact. SPE pamphlet placed on chart for patient to share with supports at discharge.  Fredirick Lathe, LCSWA Clinicial Social Worker [employer]

## 2020-03-29 NOTE — Progress Notes (Signed)
  [facility] Adult Case Management Discharge Plan :  Will you be returning to the same living situation after discharge:  No. At discharge, do you have transportation home?: No. will be provided transportation via safe transport. Do you have the ability to pay for your medications: Yes,  has medicaid.  Release of information consent forms completed and in the chart;  [name] needed at discharge.  Patient to Follow up at:  Follow-up Information    [employer]. Go to.   Contact information: [address] [place], [place] [address] [phone] ext. [phone]              Next level of care provider has access to [facility] Link:no  Safety Planning and Suicide Prevention discussed: Yes,  with pt.  Have you used any form of tobacco in the last 30 days? (Cigarettes, Smokeless Tobacco, Cigars, and/or Pipes): Yes  Has patient been referred to the Quitline?: Patient refused referral  Patient has been referred for addiction treatment: Pt. refused referral  Fredirick Lathe, LCSWA Clinicial Social Worker [employer]

## 2020-03-29 NOTE — Progress Notes (Signed)
Discharge Note:  Patient discharged to [facility] via [employer].  Suicide prevention information given and discussed with patient who stated she understood and had no questions.  Patient stated she received all her belongings, toiletries, clothing, misc items, etc.  Patient stated she appreciated all assistance received from [facility] staff.  All required discharge information given to patient at discharge.

## 2020-03-29 NOTE — Progress Notes (Signed)
Recreation Therapy Notes  Date:  10.11.21 Time: 0930 Location: 300 Hall Dayroom  Group Topic: Stress Management  Goal Area(s) Addresses:  Patient will identify positive stress management techniques. Patient will identify benefits of using stress management post d/c.  Intervention: Stress Management  Activity : Meditation.  LRT played a meditation that focused on making the most of each moment of the day.  Patients are to listen and follow along as meditation is played.   Education:  Stress Management, Discharge Planning.   Education Outcome: Acknowledges Education  Clinical Observations/Feedback: Pt did not attend group.    Shayne Diguglielmo, LRT/CTRS         Debra Sullivan A 03/29/2020 12:37 PM 

## 2020-03-29 NOTE — Plan of Care (Signed)
Nurse discussed anxiety, depression and coping skills with patient.  
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
## 2020-03-29 NOTE — BHH Suicide Risk Assessment (Signed)
[facility] Discharge Suicide Risk Assessment   Principal Problem: <principal problem not specified> Discharge Diagnoses: Active Problems:   Bipolar 1 disorder (HCC)   Total Time spent with patient: 15 minutes  Musculoskeletal: Strength & Muscle Tone: within normal limits Gait & Station: normal Patient leans: N/A  Psychiatric Specialty Exam: Review of Systems  All other systems reviewed and are negative.   Blood pressure (!) 86/57, pulse (!) 144, temperature 98.3 F (36.8 C), temperature source Oral, resp. rate 18, height 5\' 5"  (1.651 m), weight 63 kg, last menstrual period 03/26/2020, SpO2 100 %.Body mass index is 23.13 kg/m.  General Appearance: Casual  Eye Contact::  Minimal  Speech:  Normal Rate409  Volume:  Normal  Mood:  Euthymic  Affect:  Congruent  Thought Process:  Coherent and Descriptions of Associations: Circumstantial  Orientation:  Full (Time, Place, and Person)  Thought Content:  Negative  Suicidal Thoughts:  No  Homicidal Thoughts:  No  Memory:  Immediate;   Fair Recent;   Fair Remote;   Fair  Judgement:  Intact  Insight:  Lacking  Psychomotor Activity:  Normal  Concentration:  Fair  Recall:  [phone] of Knowledge:Fair  Language: Fair  Akathisia:  Negative  Handed:  Right  AIMS (if indicated):     Assets:  Desire for Improvement Resilience  Sleep:     Cognition: WNL  ADL's:  Intact   Mental Status Per Nursing Assessment::   On Admission:  NA  Demographic Factors:  Low socioeconomic status, Living alone and Unemployed  Loss Factors: Financial problems/change in socioeconomic status  Historical Factors: Impulsivity  Risk Reduction Factors:   NA  Continued Clinical Symptoms:  Bipolar Disorder:   Mixed State  Cognitive Features That Contribute To Risk:  None    Suicide Risk:  Minimal: No identifiable suicidal ideation.  Patients presenting with no risk factors but with morbid ruminations; may be classified as minimal risk based on the  severity of the depressive symptoms    Plan Of Care/Follow-up recommendations:  Activity:  ad lib  [phone], MD 03/29/2020, 7:52 AM
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
## 2020-03-29 NOTE — Progress Notes (Signed)
D:  Patient denied SI and HI, contracts for safety.  Denied A/V hallucinations.   A:  Medications administered per MD orders.  Emotional support and encouragement given patient. R:  Safety maintained with 15 minute checks.  

## 2020-04-04 LAB — POCT URINALYSIS DIP (DEVICE)
Glucose, UA: NEGATIVE mg/dL
Ketones, ur: 15 mg/dL — AB
Leukocytes,Ua: NEGATIVE
Nitrite: NEGATIVE
Protein, ur: 100 mg/dL — AB
Specific Gravity, Urine: >=1.03 (ref 1.005–1.030)
Urobilinogen, UA: 2 mg/dL — ABNORMAL HIGH (ref 0.0–1.0)
pH: 6.5 (ref 5.0–8.0)

## 2020-05-08 ENCOUNTER — Ambulatory Visit (HOSPITAL_COMMUNITY)
Admission: EM | Admit: 2020-05-08 | Discharge: 2020-05-08 | Disposition: A | Discharge status: Home / Self Care | Payer: Medicaid Other | Attending: Behavioral Health | Admitting: Behavioral Health

## 2020-05-08 ENCOUNTER — Encounter (HOSPITAL_COMMUNITY): Payer: Self-pay

## 2020-05-08 ENCOUNTER — Other Ambulatory Visit: Payer: Self-pay

## 2020-05-08 DIAGNOSIS — F309 Manic episode, unspecified: Secondary | ICD-10-CM | POA: Diagnosis not present

## 2020-05-08 LAB — POCT URINE DRUG SCREEN - MANUAL ENTRY (I-SCREEN)
POC Amphetamine UR: NOT DETECTED
POC Buprenorphine (BUP): NOT DETECTED
POC Cocaine UR: POSITIVE — AB
POC Marijuana UR: NOT DETECTED
POC Methadone UR: NOT DETECTED
POC Methamphetamine UR: NOT DETECTED
POC Morphine: NOT DETECTED
POC Oxazepam (BZO): NOT DETECTED
POC Oxycodone UR: NOT DETECTED
POC Secobarbital (BAR): NOT DETECTED

## 2020-05-08 LAB — TSH: TSH: 2.54 u[IU]/mL (ref 0.350–4.500)

## 2020-05-08 LAB — CBC WITH DIFFERENTIAL/PLATELET
Abs Immature Granulocytes: 0.04 10*3/uL (ref 0.00–0.07)
Basophils Absolute: 0.1 10*3/uL (ref 0.0–0.1)
Basophils Relative: 1 %
Eosinophils Absolute: 0.3 10*3/uL (ref 0.0–0.5)
Eosinophils Relative: 3 %
HCT: 38.3 % (ref 36.0–46.0)
Hemoglobin: 11.8 g/dL — ABNORMAL LOW (ref 12.0–15.0)
Immature Granulocytes: 0 %
Lymphocytes Relative: 18 %
Lymphs Abs: 1.9 10*3/uL (ref 0.7–4.0)
MCH: 23.5 pg — ABNORMAL LOW (ref 26.0–34.0)
MCHC: 30.8 g/dL (ref 30.0–36.0)
MCV: 76.3 fL — ABNORMAL LOW (ref 80.0–100.0)
Monocytes Absolute: 0.5 10*3/uL (ref 0.1–1.0)
Monocytes Relative: 4 %
Neutro Abs: 7.8 10*3/uL — ABNORMAL HIGH (ref 1.7–7.7)
Neutrophils Relative %: 74 %
Platelets: 432 10*3/uL — ABNORMAL HIGH (ref 150–400)
RBC: 5.02 MIL/uL (ref 3.87–5.11)
RDW: 14.8 % (ref 11.5–15.5)
WBC: 10.6 10*3/uL — ABNORMAL HIGH (ref 4.0–10.5)
nRBC: 0 % (ref 0.0–0.2)

## 2020-05-08 LAB — COMPREHENSIVE METABOLIC PANEL
ALT: 20 U/L (ref 0–44)
AST: 20 U/L (ref 15–41)
Albumin: 4.3 g/dL (ref 3.5–5.0)
Alkaline Phosphatase: 39 U/L (ref 38–126)
Anion gap: 10 (ref 5–15)
BUN: 9 mg/dL (ref 6–20)
CO2: 25 mmol/L (ref 22–32)
Calcium: 9.7 mg/dL (ref 8.9–10.3)
Chloride: 104 mmol/L (ref 98–111)
Creatinine, Ser: 0.91 mg/dL (ref 0.44–1.00)
GFR, Estimated: >60 mL/min (ref >60)
Glucose, Bld: 87 mg/dL (ref 70–99)
Potassium: 3.4 mmol/L — ABNORMAL LOW (ref 3.5–5.1)
Sodium: 139 mmol/L (ref 135–145)
Total Bilirubin: 1.1 mg/dL (ref 0.3–1.2)
Total Protein: 7.8 g/dL (ref 6.5–8.1)

## 2020-05-08 LAB — HEMOGLOBIN A1C
Hgb A1c MFr Bld: 5.1 % (ref 4.8–5.6)
Mean Plasma Glucose: 99.67 mg/dL

## 2020-05-08 LAB — POC SARS CORONAVIRUS 2 AG -  ED: SARS Coronavirus 2 Ag: NEGATIVE

## 2020-05-08 LAB — LIPID PANEL
Cholesterol: 177 mg/dL (ref 0–200)
HDL: 69 mg/dL (ref >40)
LDL Cholesterol: 94 mg/dL (ref 0–99)
Total CHOL/HDL Ratio: 2.6 RATIO
Triglycerides: 70 mg/dL (ref <150)
VLDL: 14 mg/dL (ref 0–40)

## 2020-05-08 LAB — POCT PREGNANCY, URINE: Preg Test, Ur: NEGATIVE

## 2020-05-08 LAB — MAGNESIUM: Magnesium: 2.2 mg/dL (ref 1.7–2.4)

## 2020-05-08 LAB — ETHANOL: Alcohol, Ethyl (B): <10 mg/dL (ref <10)

## 2020-05-08 LAB — GLUCOSE, CAPILLARY: Glucose-Capillary: 93 mg/dL (ref 70–99)

## 2020-05-08 MED ORDER — MAGNESIUM HYDROXIDE 400 MG/5ML PO SUSP: 30.0000 mL | Freq: Every day | ORAL | Status: DC | PRN

## 2020-05-08 MED ORDER — ACETAMINOPHEN 325 MG PO TABS: 650.0000 mg | ORAL_TABLET | Freq: Four times a day (QID) | ORAL | Status: DC | PRN

## 2020-05-08 MED ORDER — ALUM & MAG HYDROXIDE-SIMETH 200-200-20 MG/5ML PO SUSP: 30.0000 mL | ORAL | Status: DC | PRN

## 2020-05-08 NOTE — BH Assessment (Signed)
Comprehensive Clinical Assessment (CCA) Note  05/08/2020 [name] [id number]   JAQLYN Sullivan is a 34 year old female who presents voluntary and unaccompanied to [facility] via [employer]. Pt was a very poor historian during the assessment, answered few questions. Clinician asked the pt, "what brought you to the hospital?" Pt reported, "mental health, sleepy, body aches all over." As clinician continued to ask questions during the assessment pt became upset, asked how many questions was left and demanded food. Clinician expressed to the pt, there are a lot more questions, pt wanted a specific number, clinician said "ten," pt then said she was not answering any questions because she hasn't got any food. Clinician ended the assessment and discussed pt's presentation with provider pt followed clinician to the providers office looked inside then walked back to assessment room. Pt denies, SI, HI, AVH, self-injurious behaviors and access to weapons.   Per Thayer Ohm, Deputy with [facility] Department, the pt arrived at [facility] with [employer] with only a top on, naked on bottom.   Per Willia Craze, RN the pt was stripping at somebody's house, threatened to blow it up then the police were called.   Pt denies, substance use. Pt denies, being linked to OPT resources (medication management and/or counseling.) Pt has a previous inpatient admission at [facility] on 03/27/2020.  As clinician entered the room pt looked clinician up and down. Pt was slow to respond to questions and quick to upset. Pt presents alert with bottoms on, wrapped in a blanket with irritable speech. Pt's mood, affect was irritable Pt's insight and judgement are poor. Pt reported, if discharged she can contract for safety.    Disposition: Gillermo Murdoch PMHNP recommends pt to be admitted [facility] Continuous Observation [facility].   Diagnosis: Bipolar 1 disorder (HCC)  *Clinician asked about supports a few times however the pt did not  respond.*   Chief Complaint:  Chief Complaint  Patient presents with  . Manic Behavior   Visit Diagnosis:     CCA Screening, Triage and Referral (STR)  Patient Reported Information How did you hear about [place]? Legal System  Referral name: [employer]  Referral phone number: 911   Whom do you see for routine medical problems? Hospital ER  Practice/Facility Name: [facility]  Practice/Facility Phone Number: No data recorded Name of Contact: No data recorded Contact Number: No data recorded Contact Fax Number: No data recorded Prescriber Name: No data recorded Prescriber Address (if known): No data recorded  What Is the Reason for Your Visit/Call Today? Pt reported, "mental health."  How Long Has This Been Causing You Problems? <Week  What Do You Feel Would Help You the Most Today? Therapy;Medication   Have You Recently Been in Any Inpatient Treatment (Hospital/Detox/Crisis Center/28-Day Program)? Yes  Name/Location of Program/Hospital:[facility]  How Long Were You There? 2 days  When Were You Discharged? 03/29/20   Have You Ever Received Services From [employer] Before? Yes  Who Do You See at [facility]? Pt was [facility] at [facility] on [facility]. Pt was at [facility] from 03/27/2020-03/29/2020.   Have You Recently Had Any Thoughts About Hurting Yourself? No (Pt denies.)  Are You Planning to Commit Suicide/Harm Yourself At This time? No (Pt denies.)   Have you Recently Had Thoughts About Hurting Someone Karolee Ohs? No (Pt denies.)  Explanation: No data recorded  Have You Used Any Alcohol or Drugs in the Past 24 Hours? No (Pt denies use.)  How Long Ago Did You Use Drugs or Alcohol?  No data recorded What Did You Use and How Much? No data recorded  Do You Currently Have a Therapist/Psychiatrist? No  Name of Therapist/Psychiatrist: No data recorded  Have You Been Recently Discharged From Any Office Practice or Programs? No  Explanation of Discharge From Practice/Program: No data  recorded    CCA Screening Triage Referral Assessment Type of Contact: Face-to-Face  Is this Initial or Reassessment? No data recorded Date Telepsych consult ordered in CHL:  No data recorded Time Telepsych consult ordered in CHL:  No data recorded  Patient Reported Information Reviewed? Yes  Patient Left Without Being Seen? No data recorded Reason for Not Completing Assessment: No data recorded  Collateral Involvement: [facility]   Does Patient Have a Court Appointed Legal Guardian? No data recorded Name and Contact of Legal Guardian: No data recorded If Minor and Not Living with Parent(s), Who has Custody? No data recorded Is CPS involved or ever been involved? No data recorded Is APS involved or ever been involved? Never   Patient Determined To Be At Risk for Harm To Self or Others Based on Review of Patient Reported Information or Presenting Complaint? No  Method: No data recorded Availability of Means: No data recorded Intent: No data recorded Notification Required: No data recorded Additional Information for Danger to Others Potential: No data recorded Additional Comments for Danger to Others Potential: No data recorded Are There Guns or Other Weapons in Your Home? No data recorded Types of Guns/Weapons: No data recorded Are These Weapons Safely Secured?                            No data recorded Who Could Verify You Are Able To Have These Secured: No data recorded Do You Have any Outstanding Charges, Pending Court Dates, Parole/Probation? No data recorded Contacted To Inform of Risk of Harm To Self or Others: No data recorded  Location of Assessment: [facility] Assessment Services   Does Patient Present under Involuntary Commitment? No  IVC Papers Initial File Date: No data recorded  [place] of Residence: [place]   Patient Currently Receiving the Following Services: [employer]   Determination of Need: Urgent (48 hours)   Options For Referral:  Medication Management;Inpatient Hospitalization;Outpatient Therapy;BH Urgent Care    CCA Biopsychosocial Intake/Chief Complaint:  Per pt, "mental health."  Current Symptoms/Problems: Per pt, "mental health, sleepy, achy all over."   Patient Reported Schizophrenia/Schizoaffective Diagnosis in Past: No data recorded  Strengths: UTA  Preferences: UTA  Abilities: UTA   Type of Services Patient Feels are Needed: UTA   Initial Clinical Notes/Concerns: Pt was seen at [facility] on 10/08. Pt was at [facility] from 03/27/2020-03/29/2020.   Mental Health Symptoms Depression:  Fatigue;Worthlessness;Hopelessness;Increase/decrease in appetite;Difficulty Concentrating;Sleep (too much or little)   Duration of Depressive symptoms: No data recorded  Mania:  Irritability   Anxiety:   Fatigue;Irritability   Psychosis:  No data recorded  Duration of Psychotic symptoms: No data recorded  Trauma:  No data recorded  Obsessions:  No data recorded  Compulsions:  No data recorded  Inattention:  No data recorded  Hyperactivity/Impulsivity:  N/A   Oppositional/Defiant Behaviors:  None   Emotional Irregularity:  Mood lability;Intense/inappropriate anger   Other Mood/Personality Symptoms:  No data recorded   Mental Status Exam Appearance and self-care  Stature:  Average   Weight:  Average weight   Clothing:  Disheveled (Pt came to [facility] with only a top one, pt was naked on the  bottom. Pt has bottoms on and is wrapped in a blanket.)   Grooming:  Neglected   Cosmetic use:  None   Posture/gait:  Slumped   Motor activity:  Agitated   Sensorium  Attention:  Inattentive   Concentration:  Preoccupied   Orientation:  Place;Person;Object   Recall/memory:  Defective in Immediate   Affect and Mood  Affect:  Blunted;Other (Comment) (Irritable.)   Mood:  Irritable   Relating  Eye contact:  Normal   Facial expression:  Angry   Attitude toward examiner:  Argumentative   Thought and  Language  Speech flow: Other (Comment) (Irritable speech.)   Thought content:  Appropriate to Mood and Circumstances   Preoccupation:  None   Hallucinations:  None   Organization:  No data recorded  [employer] of Knowledge:  Poor   Intelligence:  Average   Abstraction:  No data recorded  Judgement:  Poor   Reality Testing:  No data recorded  Insight:  Poor   Decision Making:  Impulsive   Social Functioning  Social Maturity:  Impulsive   Social Judgement:  No data recorded  Stress  Stressors:  No data recorded  Coping Ability:  Overwhelmed   Skill Deficits:  Communication;Decision making   Supports:  Support needed     Religion: Religion/Spirituality Are You A Religious Person?:  Industrial/product designer)  Leisure/Recreation: Leisure / Recreation Do You Have Hobbies?:  (UTA)  Exercise/Diet: Exercise/Diet Do You Exercise?:  (UTA) Do You Follow a Special Diet?:  (UTA) Do You Have Any Trouble Sleeping?:  (Pt reported, wanting to sleep.)   CCA Employment/Education Employment/Work Situation: Employment / Work Situation Employment situation:  Industrial/product designer) What is the longest time patient has a held a job?: UTA Where was the patient employed at that time?: UTA Has patient ever been in the [employer]?:  Industrial/product designer)  Education: Education Is Patient Currently Attending School?:  (UTA) Last Grade Completed:  (UTA) Name of High School: [facility] Did Garment/textile technologist From [employer]?:  (UTA) Did You Attend College?:  (UTA) Did You Attend Graduate School?:  (UTA)   CCA Family/Childhood History Family and Relationship History: Family history Marital status: Single What is your sexual orientation?: UTA Has your sexual activity been affected by drugs, alcohol, medication, or emotional stress?: UTA Does patient have children?:  (UTA)  Childhood History:  Childhood History By whom was/is the patient raised?:  (UTA) Additional childhood history information: UTA Description of  patient's relationship with caregiver when they were a child: UTA Patient's description of current relationship with people who raised him/her: UTA How were you disciplined when you got in trouble as a child/adolescent?: UTA Does patient have siblings?:  (UTA) Did patient suffer any verbal/emotional/physical/sexual abuse as a child?:  (UTA) Did patient suffer from severe childhood neglect?:  (UTA) Has patient ever been sexually abused/assaulted/raped as an adolescent or adult?:  (UTA) Was the patient ever a victim of a crime or a disaster?:  (UTA) Witnessed domestic violence?:  (UTA)  Child/Adolescent Assessment:     CCA Substance Use Alcohol/Drug Use: Alcohol / Drug Use Pain Medications: See MAR Prescriptions: See MAR Over the Counter: See MAR History of alcohol / drug use?: No history of alcohol / drug abuse (Pt denies, use.)    ASAM's:  Six Dimensions of Multidimensional Assessment  Dimension 1:  Acute Intoxication and/or Withdrawal Potential:      Dimension 2:  Biomedical Conditions and Complications:      Dimension 3:  Emotional, Behavioral, or Cognitive Conditions and  Complications:     Dimension 4:  Readiness to Change:     Dimension 5:  Relapse, Continued use, or Continued Problem Potential:     Dimension 6:  Recovery/Living Environment:     ASAM Severity Score:    ASAM Recommended Level of Treatment:     Substance use Disorder (SUD)    Recommendations for Services/Supports/Treatments: Recommendations for Services/Supports/Treatments Recommendations For Services/Supports/Treatments: Other (Comment) ([facility] Continuous Observation Unit.)  DSM5 Diagnoses: Patient Active Problem List   Diagnosis Date Noted  . Bipolar 1 disorder (HCC) 03/27/2020  . Bipolar I disorder, most recent episode (or current) manic (HCC) 06/14/2018  . VBAC, delivered, current hospitalization 01/22/2018  . Supervision of high-risk pregnancy 01/21/2018  . LGA (large for gestational age)  fetus affecting management of mother, third trimester, fetus 1 01/08/2018  . Anemia during pregnancy in third trimester 11/28/2017  . Drug dependence affecting pregnancy in third trimester   . Suspected fetal anomaly, antepartum   . Trichomonosis 08/01/2017  . Not immune to rubella 08/01/2017  . Supervision of other normal pregnancy, antepartum 07/11/2017  . History of cesarean section complicating pregnancy 07/11/2017  . Unwanted fertility 07/11/2017  . 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 Cannabis use disorder, severe, dependence (HCC) 04/15/2015  . PTSD (post-traumatic stress disorder) 04/15/2015  . Cocaine use disorder, moderate, in early remission (HCC) 04/15/2015  . Psychosis (HCC) 04/14/2015     Referrals to Alternative Service(s): Referred to Alternative Service(s):   Place:   Date:   Time:    Referred to Alternative Service(s):   Place:   Date:   Time:    Referred to Alternative Service(s):   Place:   Date:   Time:    Referred to Alternative Service(s):   Place:   Date:   Time:     Redmond Pulling, [facility]  Comprehensive Clinical Assessment (CCA) Screening, Triage and Referral Note  05/08/2020 EMMELY BITTINGER [id number]  Chief Complaint:  Chief Complaint  Patient presents with  . Manic Behavior   Visit Diagnosis:  Patient Reported Information How did you hear about [place]? Legal System   Referral name: [employer]   Referral phone number: 911  Whom do you see for routine medical problems? Hospital ER   Practice/Facility Name: [facility]   Practice/Facility Phone Number: No data recorded  Name of Contact: No data recorded  Contact Number: No data recorded  Contact Fax Number: No data recorded  Prescriber Name: No data recorded  Prescriber Address (if known): No data recorded What Is the Reason for Your Visit/Call Today? Pt reported, "mental health."  How Long Has This Been Causing You Problems? <Week  Have You  Recently Been in Any Inpatient Treatment (Hospital/Detox/Crisis Center/28-Day Program)? Yes   Name/Location of Program/Hospital:[facility]   How Long Were You There? 2 days   When Were You Discharged? 03/29/20  Have You Ever Received Services From [employer] Before? Yes   Who [facility] at [facility]? Pt was seen at [facility] on 10/08. Pt [facility] at [facility] from 03/27/2020-03/29/2020.  Have You Recently Had Any Thoughts About Hurting Yourself? No (Pt denies.)   Are You Planning to Commit Suicide/Harm Yourself At This time?  No (Pt denies.)  Have you Recently Had Thoughts About Hurting Someone Karolee Ohs? No (Pt denies.)   Explanation: No data recorded Have You Used Any Alcohol or Drugs in the Past 24 Hours? No (Pt denies use.)   How Long Ago Did You Use  Drugs or Alcohol?  No data recorded  What Did You Use and How Much? No data recorded What Do You Feel Would Help You the Most Today? Therapy;Medication  Do You Currently Have a Therapist/Psychiatrist? No   Name of Therapist/Psychiatrist: No data recorded  Have You Been Recently Discharged From Any Office Practice or Programs? No   Explanation of Discharge From Practice/Program:  No data recorded    CCA Screening Triage Referral Assessment Type of Contact: Face-to-Face   Is this Initial or Reassessment? No data recorded  Date Telepsych consult ordered in CHL:  No data recorded  Time Telepsych consult ordered in CHL:  No data recorded Patient Reported Information Reviewed? Yes   Patient Left Without Being Seen? No data recorded  Reason for Not Completing Assessment: No data recorded Collateral Involvement: [facility]  Does Patient Have a Court Appointed Legal Guardian? No data recorded  Name and Contact of Legal Guardian:  No data recorded If Minor and Not Living with Parent(s), Who has Custody? No data recorded Is CPS involved or ever been involved? No data recorded Is APS involved or ever been involved? Never  Patient Determined To Be  At Risk for Harm To Self or Others Based on Review of Patient Reported Information or Presenting Complaint? No   Method: No data recorded  Availability of Means: No data recorded  Intent: No data recorded  Notification Required: No data recorded  Additional Information for Danger to Others Potential:  No data recorded  Additional Comments for Danger to Others Potential:  No data recorded  Are There Guns or Other Weapons in Your Home?  No data recorded   Types of Guns/Weapons: No data recorded   Are These Weapons Safely Secured?                              No data recorded   Who Could Verify You Are Able To Have These Secured:    No data recorded Do You Have any Outstanding Charges, Pending Court Dates, Parole/Probation? No data recorded Contacted To Inform of Risk of Harm To Self or Others: No data recorded [facility]  Does Patient Present under Involuntary Commitment? No   IVC Papers Initial File Date: No data recorded  [place]  Patient Currently Receiving the Following Services: [employer]   Determination of Need: Urgent (48 hours)   Options For Referral: Medication Management;Inpatient Hospitalization;Outpatient [facility]herapy;BH Urgent Care   Redmond Pullingreylese D Yuridia Couts, [facility]     Redmond Pullingreylese D Mingo Siegert, MS, [facility], [facility] Triage Specialist [phone]

## 2020-05-08 NOTE — ED Triage Notes (Signed)
Patient arrives via [employer]. When asked why she's here patient rambles a bit, does state "just feeling a bit unusual, maybe a detox problem". Patient denies SI/HI. When asked about hallucinations patient says "maybe, well sometimes". Pt in no acute distress at this time.

## 2020-05-08 NOTE — ED Notes (Signed)
Patient noncompliant with nurse while trying to complete POC covid test, finally allowed proper technique to obtain sample. Patient asked for urine sample and returned with cup of cold clear liquid. When this nurse asked why patient brought back a cup of water patient grabbed the cup back and returned to bathroom. Patient then come back with cup of warm yellow colored liquid presumed to be urine. Patient did comply with blood draw but when asked to remain still so this nurse could remove tourniquet patient pulled arm away dislodging needle to floor and causing blood to splatter. Patient was compliant with skin assessment but when asked to complete RT-PCR swab patient refused. Patient informed that in order to stay in this facility the test had to be completed. Patient again refused. This RN spoke with NP Annice Pih who stated we would not force patient and she could leave AMA. Process explained to patient per guidlelines and patient signed AMA form. The deputy retrieved belongings from locker and this RN and the deputy escorted patient to lobby. Patient was reminded several times if she required further treatment she was welcome to return, understanding that she would have to comply with necessary testing at that time.

## 2020-05-09 ENCOUNTER — Encounter (HOSPITAL_COMMUNITY): Payer: Self-pay | Admitting: Emergency Medicine

## 2020-05-09 ENCOUNTER — Other Ambulatory Visit: Payer: Self-pay

## 2020-05-09 ENCOUNTER — Ambulatory Visit (HOSPITAL_COMMUNITY)
Admission: EM | Admit: 2020-05-09 | Discharge: 2020-05-10 | Disposition: A | Discharge status: Home / Self Care | Payer: Medicaid Other | Attending: Behavioral Health | Admitting: Behavioral Health

## 2020-05-09 DIAGNOSIS — F323 Major depressive disorder, single episode, severe with psychotic features: Secondary | ICD-10-CM | POA: Insufficient documentation

## 2020-05-09 DIAGNOSIS — F1421 Cocaine dependence, in remission: Secondary | ICD-10-CM

## 2020-05-09 DIAGNOSIS — Z20822 Contact with and (suspected) exposure to covid-19: Secondary | ICD-10-CM | POA: Insufficient documentation

## 2020-05-09 DIAGNOSIS — Z5900 Homelessness unspecified: Secondary | ICD-10-CM | POA: Insufficient documentation

## 2020-05-09 DIAGNOSIS — F1490 Cocaine use, unspecified, uncomplicated: Secondary | ICD-10-CM | POA: Insufficient documentation

## 2020-05-09 DIAGNOSIS — Z79899 Other long term (current) drug therapy: Secondary | ICD-10-CM | POA: Insufficient documentation

## 2020-05-09 DIAGNOSIS — F17210 Nicotine dependence, cigarettes, uncomplicated: Secondary | ICD-10-CM | POA: Insufficient documentation

## 2020-05-09 LAB — POC SARS CORONAVIRUS 2 AG -  ED: SARS Coronavirus 2 Ag: NEGATIVE

## 2020-05-09 LAB — RESPIRATORY PANEL BY RT PCR (FLU A&B, COVID)
Influenza A by PCR: NEGATIVE
Influenza B by PCR: NEGATIVE
SARS Coronavirus 2 by RT PCR: NEGATIVE

## 2020-05-09 LAB — PROLACTIN: Prolactin: 5.3 ng/mL (ref 4.8–23.3)

## 2020-05-09 MED ORDER — LORAZEPAM 1 MG PO TABS
2.0000 mg | ORAL_TABLET | Freq: Once | ORAL | Status: AC
  Administered 2020-05-09: 2 mg via ORAL
  Filled 2020-05-09: qty 2

## 2020-05-09 MED ORDER — TRAZODONE HCL 50 MG PO TABS
50.0000 mg | ORAL_TABLET | Freq: Every evening | ORAL | Status: DC | PRN
  Administered 2020-05-09: 50 mg via ORAL
  Filled 2020-05-09: qty 1
  Filled 2020-05-09: qty 7

## 2020-05-09 MED ORDER — DIPHENHYDRAMINE HCL 50 MG/ML IJ SOLN
50.0000 mg | Freq: Once | INTRAMUSCULAR | Status: AC
  Administered 2020-05-09: 50 mg via INTRAMUSCULAR
  Filled 2020-05-09: qty 1

## 2020-05-09 MED ORDER — RISPERIDONE 1 MG PO TBDP
1.0000 mg | ORAL_TABLET | Freq: Every day | ORAL | Status: DC
  Administered 2020-05-09 (×2): 1 mg via ORAL
  Filled 2020-05-09: qty 1
  Filled 2020-05-09: qty 7
  Filled 2020-05-09: qty 1

## 2020-05-09 MED ORDER — HYDROXYZINE HCL 25 MG PO TABS
25.0000 mg | ORAL_TABLET | Freq: Three times a day (TID) | ORAL | Status: DC | PRN
  Administered 2020-05-09: 25 mg via ORAL
  Filled 2020-05-09: qty 1
  Filled 2020-05-09: qty 10

## 2020-05-09 MED ORDER — MAGNESIUM HYDROXIDE 400 MG/5ML PO SUSP: 30.0000 mL | Freq: Every day | ORAL | Status: DC | PRN

## 2020-05-09 MED ORDER — ALUM & MAG HYDROXIDE-SIMETH 200-200-20 MG/5ML PO SUSP: 30.0000 mL | ORAL | Status: DC | PRN

## 2020-05-09 MED ORDER — HALOPERIDOL LACTATE 5 MG/ML IJ SOLN
5.0000 mg | Freq: Once | INTRAMUSCULAR | Status: AC
  Administered 2020-05-09: 5 mg via INTRAMUSCULAR
  Filled 2020-05-09: qty 1

## 2020-05-09 MED ORDER — ACETAMINOPHEN 325 MG PO TABS: 650.0000 mg | ORAL_TABLET | Freq: Four times a day (QID) | ORAL | Status: DC | PRN

## 2020-05-09 NOTE — BH Assessment (Signed)
Comprehensive Clinical Assessment (CCA) Screening, Triage and Referral Note  05/09/2020 Debra Sullivan [id number]  Chief Complaint:  Chief Complaint  Patient presents with  . Agitation  . Altered Mental Status  -Patient was brought to [facility] by [employer].  They had gotten reports of someone getting in and out of cars.  Pt was found by [employer] and brought to [facility].  Pt was at [facility] last night (within the last 24 hours but left AMA.  She was agitated at that time.  Patient is quiet at the start of intake.  She does not talk much.  She looks off in space and does not make good eye contact.  Patient denies any SI.  She shakes her head "no" when asked about SI and HI.  She looks like she may be hearing things.  Patient did agree to stay at [facility] after Elenore Paddy, NP saw her.  Pt was given oral medication to help with agitation.    Pt is now talking loudly in the adult observation area.  She is responding to internal stimuli and is talking about "her baby" and how she can take care of herself.  Pt is having conversations with persons not present.    Visit Diagnosis: Schizoaffective d/o.    Patient Reported Information How did you hear about [place]? Legal System   Referral name: [employer] brought her in.   Referral phone number: 911  Whom do you see for routine medical problems? I don't have a doctor   Practice/Facility Name: [facility]   Practice/Facility Phone Number: No data recorded  Name of Contact: No data recorded  Contact Number: No data recorded  Contact Fax Number: No data recorded  Prescriber Name: No data recorded  Prescriber Address (if known): No data recorded What Is the Reason for Your Visit/Call Today? Pt was brought in by [employer] after someone reported to them that patient was getting in and out of cars.  Police found her in the vacinity of the report and brought her to [facility].  How Long Has This Been Causing You Problems? <Week  Have You Recently Been in Any Inpatient Treatment  (Hospital/Detox/Crisis Center/28-Day Program)? Yes (Was at [facility] within the last 24 hours.)   Name/Location of Program/Hospital:[facility]   How Long Were You There? 2 days   When Were You Discharged? 03/29/20  Have You Ever Received Services From [employer] Before? Yes   Who Do You See at [facility]? Pt wa at [facility] from 10/09 to 10/11 '21.  Pt came to [facility] last night also.  Have You Recently Had Any Thoughts About Hurting Yourself? No   Are You Planning to Commit Suicide/Harm Yourself At This time?  No  Have you Recently Had Thoughts About Hurting Someone Karolee Ohs? No   Explanation: No data recorded Have You Used Any Alcohol or Drugs in the Past 24 Hours? No   How Long Ago Did You Use Drugs or Alcohol?  No data recorded  What Did You Use and How Much? No data recorded What Do You Feel Would Help You the Most Today? Therapy;Medication  Do You Currently Have a Therapist/Psychiatrist? No   Name of Therapist/Psychiatrist: No data recorded  Have You Been Recently Discharged From Any Office Practice or Programs? No   Explanation of Discharge From Practice/Program:  No data recorded    CCA Screening Triage Referral Assessment Type of Contact: Face-to-Face   Is this Initial or Reassessment? No data recorded  Date Telepsych consult ordered in  [facility]:  No data recorded  Time Telepsych consult ordered in [facility]:  No data recorded Patient Reported Information Reviewed? Yes   Patient Left Without Being Seen? No data recorded  Reason for Not Completing Assessment: No data recorded Collateral Involvement: [facility]  Does Patient Have a Court Appointed Legal Guardian? No data recorded  Name and Contact of Legal Guardian:  No data recorded If Minor and Not Living with Parent(s), Who has Custody? No data recorded Is CPS involved or ever been involved? No data recorded Is APS involved or ever been involved? Never  Patient Determined To Be At Risk for Harm To Self or Others Based on Review of  Patient Reported Information or Presenting Complaint? No   Method: No data recorded  Availability of Means: No data recorded  Intent: No data recorded  Notification Required: No data recorded  Additional Information for Danger to Others Potential:  No data recorded  Additional Comments for Danger to Others Potential:  No data recorded  Are There Guns or Other Weapons in Your Home?  No data recorded   Types of Guns/Weapons: No data recorded   Are These Weapons Safely Secured?                              No data recorded   Who Could Verify You Are Able To Have These Secured:    No data recorded Do You Have any Outstanding Charges, Pending Court Dates, Parole/Probation? No data recorded Contacted To Inform of Risk of Harm To Self or Others: No data recorded Location of Assessment: [facility]  Does Patient Present under Involuntary Commitment? No   IVC Papers Initial File Date: No data recorded  [place] of Residence: [place] (Homeless in [place])  Patient Currently Receiving the Following Services: Intensive-in-Home Services   Determination of Need: Urgent (48 hours)   Options For Referral: Medication Management;BH Urgent Care;Outpatient Therapy   Debra Sullivan, LCAS

## 2020-05-09 NOTE — ED Notes (Signed)
Pt could not urinate@this time 

## 2020-05-09 NOTE — ED Notes (Signed)
Pt asleep with even and unlabored respirations. No distress or discomfort noted. Pt remains safe on the unit. Will continue to monitor. 

## 2020-05-09 NOTE — Discharge Instructions (Addendum)
Take all of your medications as prescribed by your mental healthcare provider.  Report any adverse effects and reactions from your medications to your outpatient provider promptly.  Do not engage in alcohol and or illegal drug use while on prescription medicines. °Keep all scheduled appointments. This is to ensure that you are getting refills on time and to avoid any interruption in your medication.  If you are unable to keep an appointment call to reschedule.  Be sure to follow up with resources and follow ups given. °In the event of worsening symptoms call the crisis hotline, 911, and or go to the nearest emergency department for appropriate evaluation and treatment of symptoms. °Follow-up with your primary care provider for your medical issues, concerns and or health care needs.  ° ° °Homeless Shelter List: ° °  ° °[facility] ° °[address] [place], [place] ° °Phone: [phone] ° °  ° °[facility] ° °[address], [place], [place] [address] ° °Phone: [phone] ° °  ° °[facility] (Women only) ° °[address], [place], [place] [address] ° °Phone: [phone] ° °  ° °[facility] ° °[address] [place], [place] [address] ° °Phone: [phone] ° °  ° °[facility]: ° °[address] ° °[place], [place] [address] ° °Phone: [phone] ° °  ° °[facility] ° °[address], [place],  [address] ° °(Check in at 6:00PM for placement at a local shelter) ° °Phone: [phone] ° ° °

## 2020-05-09 NOTE — ED Notes (Signed)
Pt sleeping@this time. Breathing even and unlabored. Will continue to monitor for safety 

## 2020-05-09 NOTE — ED Notes (Signed)
Pt sleeping at present, no distress noted, resp even & unlabored.  Monitoring for safety. °

## 2020-05-09 NOTE — ED Provider Notes (Addendum)
Behavioral Health Admission [facility])  Date: 05/09/20 Patient Name: Debra Sullivan MRN: [id number] Chief Complaint:  Chief Complaint  Patient presents with  . Agitation  . Altered Mental Status      Diagnoses: Acute Psychosis   HPI: Debra Sullivan is a 34 year old female who presents voluntary and unaccompanied to [facility] via [employer]. The patient is very poor historian during the assessment, answered few questions. The patient becoming verbally aggressive towards all staff members. This provider ended the assessment due to the patient aggressive behaviors. The patient is refusing all initial lab work. Discussion with the patient before she is able to stay in house she has to allow the nurse to do her COVID swap. The patient is refusing and decided to leave AMA. The patient denies, SI, HI, AVH, self-injurious behaviors and access to weapons.  Per Thayer Ohm, Deputy with [facility] Department, the pt arrived at [facility] with [employer] with only a top on, naked on bottom.    Per Willia Craze, RN the patient was stripping at somebody's house, threatened to blow it up then the police were called.  PHQ 2-9:    ED from 03/26/2020 in [facility] Office Visit from 05/07/2017 in [facility]  Thoughts that you would be better off dead, or of hurting yourself in some way Several days Not at all  PHQ-9 Total Score 19 0        [facility]) [facility] from 03/26/2020 in [facility] Admission (Discharged) from [facility]14[facility]  C-SSRS RISK CATEGORY No Risk No Risk No Risk       Total Time spent with patient: 45 minutes  Musculoskeletal  Strength & Muscle Tone: within normal limits Gait & Station: normal Patient leans: Right  Psychiatric Specialty Exam  Presentation General Appearance:  Bizarre  Eye Contact:Minimal  Speech:Pressured  Speech Volume:Increased  Handedness:Right   Mood and Affect  Mood:Anxious;Irritable  Affect:Inappropriate;Full Range   Thought Process  Thought Processes:Disorganized  Descriptions of Associations:Loose  Orientation:Partial  Thought Content:Paranoid Ideation;Rumination;Scattered  Hallucinations:Hallucinations: None  Ideas of Reference:Delusions;Paranoia  Suicidal Thoughts:Suicidal Thoughts: No  Homicidal Thoughts:Homicidal Thoughts: No   Sensorium  Memory:Immediate Poor;Recent Poor;Remote Poor  Judgment:Poor  Insight:Lacking   Executive Functions  Concentration:Poor  Attention Span:Poor  Recall:Poor  Fund of Knowledge:Poor  Language:Poor   Psychomotor Activity  Psychomotor Activity:Psychomotor Activity: Restlessness   Assets  Assets:Communication Skills;Desire for Improvement;Resilience;Social Support   Sleep  Sleep:Sleep: Poor   Physical Exam Vitals and nursing note reviewed.  Cardiovascular:     Rate and Rhythm: Normal rate.  Pulmonary:     Effort: Pulmonary effort is normal.  Musculoskeletal:        General: Normal range of motion.  Neurological:     Mental Status: She is alert. She is disoriented.  Psychiatric:        Attention and Perception: She is inattentive. She perceives auditory hallucinations.        Mood and Affect: Mood is anxious. Affect is angry.        Speech: Speech is rapid and pressured.        Behavior: Behavior is uncooperative, agitated and aggressive.        Thought Content: Thought content is paranoid and delusional.        Cognition and Memory: Cognition is impaired. Memory is impaired. She exhibits impaired recent memory and impaired  remote memory.        Judgment: Judgment is impulsive and inappropriate.    Review of Systems  Psychiatric/Behavioral: Positive for hallucinations. The patient is nervous/anxious and has insomnia.   All other systems reviewed  and are negative.   Blood pressure (!) 122/91, pulse 99, temperature 97.7 F (36.5 C), temperature source Tympanic, resp. rate 18, SpO2 100 %. There is no height or weight on file to calculate BMI.  Past Psychiatric History:   Is the patient at risk to self? Yes  Has the patient been a risk to self in the past 6 months? Yes .    Has the patient been a risk to self within the distant past? Yes   Is the patient a risk to others? Yes   Has the patient been a risk to others in the past 6 months? Yes   Has the patient been a risk to others within the distant past? Yes   Past Medical History:  Past Medical History:  Diagnosis Date  . Anemia   . Drug abuse (HCC)   . Psychosis (HCC) 2013    Past Surgical History:  Procedure Laterality Date  . CESAREAN SECTION  03/2016   pLTCS for twin B at [facility]  . LAPAROSCOPY N/A 04/14/2017   Procedure: LAPAROSCOPY OPERATIVE WITH RIGHT SALPINGECTOMY;  Surgeon: Conan Bowens, MD;  Location: [facility];  Service: Gynecology;  Laterality: N/A;  . TUBAL LIGATION Bilateral 01/22/2018   Procedure: POST PARTUM TUBAL LIGATION;  Surgeon: [name], DO;  Location: [facility];  Service: Gynecology;  Laterality: Bilateral;    Family History:  Family History  Problem Relation Age of Onset  . Diabetes Mother   . Schizophrenia Mother   . Diabetes Brother   . Hypertension Maternal Aunt   . Healthy Father     Social History:  Social History   Socioeconomic History  . Marital status: Single    Spouse name: Not on file  . Number of children: Not on file  . Years of education: Not on file  . Highest education level: Not on file  Occupational History  . Not on file  Tobacco Use  . Smoking status: Current Every Day Smoker    Packs/day: 0.25    Types: Cigarettes  . Smokeless tobacco: Never Used  Vaping Use  . Vaping Use: Never used  Substance and Sexual Activity  . Alcohol use: Not Currently    Alcohol/week: 0.0 standard drinks     Comment: socially  . Drug use: Not Currently    Types: Marijuana, Cocaine    Comment: Cocaine & Marijuana was used10/26/2018  . Sexual activity: Yes    Birth control/protection: None  Other Topics Concern  . Not on file  Social History Narrative  . Not on file   Social Determinants of Health   Financial Resource Strain:   . Difficulty of Paying Living Expenses: Not on file  Food Insecurity:   . Worried About Programme researcher, broadcasting/film/video in the Last Year: Not on file  . Ran Out of Food in the Last Year: Not on file  Transportation Needs:   . Lack of Transportation (Medical): Not on file  . Lack of Transportation (Non-Medical): Not on file  Physical Activity:   . Days of Exercise per Week: Not on file  . Minutes of Exercise per Session: Not on file  Stress:   . Feeling of Stress : Not on file  Social Connections:   . Frequency  of Communication with Friends and Family: Not on file  . Frequency of Social Gatherings with Friends and Family: Not on file  . Attends Religious Services: Not on file  . Active Member of Clubs or Organizations: Not on file  . Attends Banker Meetings: Not on file  . Marital Status: Not on file  Intimate Partner Violence:   . Fear of Current or Ex-Partner: Not on file  . Emotionally Abused: Not on file  . Physically Abused: Not on file  . Sexually Abused: Not on file    SDOH:  SDOH Screenings   Alcohol Screen: Low Risk   . Last Alcohol Screening Score (AUDIT): 0  Depression (PHQ2-9): Medium Risk  . PHQ-2 Score: 19  Financial Resource Strain:   . Difficulty of Paying Living Expenses: Not on file  Food Insecurity:   . Worried About Programme researcher, broadcasting/film/video in the Last Year: Not on file  . Ran Out of Food in the Last Year: Not on file  Housing:   . Last Housing Risk Score: Not on file  Physical Activity:   . Days of Exercise per Week: Not on file  . Minutes of Exercise per Session: Not on file  Social Connections:   . Frequency of  Communication with Friends and Family: Not on file  . Frequency of Social Gatherings with Friends and Family: Not on file  . Attends Religious Services: Not on file  . Active Member of Clubs or Organizations: Not on file  . Attends Banker Meetings: Not on file  . Marital Status: Not on file  Stress:   . Feeling of Stress : Not on file  Tobacco Use: High Risk  . Smoking Tobacco Use: Current Every Day Smoker  . Smokeless Tobacco Use: Never Used  Transportation Needs:   . Freight forwarder (Medical): Not on file  . Lack of Transportation (Non-Medical): Not on file    Last Labs:  Admission on 05/09/2020  Component Date Value Ref Range Status  . SARS Coronavirus 2 Ag 05/09/2020 Negative  Negative Preliminary  . SARS Coronavirus 2 by RT PCR 05/09/2020 NEGATIVE  NEGATIVE Final   Comment: (NOTE) SARS-CoV-2 target nucleic acids are NOT DETECTED.  The SARS-CoV-2 RNA is generally detectable in upper respiratoy specimens during the acute phase of infection. The lowest concentration of SARS-CoV-2 viral copies this assay can detect is 131 copies/mL. A negative result does not preclude SARS-Cov-2 infection and should not be used as the sole basis for treatment or other patient management decisions. A negative result may occur with  improper specimen collection/handling, submission of specimen other than nasopharyngeal swab, presence of viral mutation(s) within the areas targeted by this assay, and inadequate number of viral copies (<131 copies/mL). A negative result must be combined with clinical observations, patient history, and epidemiological information. The expected result is Negative.  Fact Sheet for Patients:  https://www.moore.com/  Fact Sheet for Healthcare Providers:  https://www.young.biz/  This test is no                          t yet approved or cleared by the [place] FDA and  has been authorized for detection  and/or diagnosis of SARS-CoV-2 by FDA under an Emergency Use Authorization (EUA). This EUA will remain  in effect (meaning this test can be used) for the duration of the COVID-19 declaration under Section 564(b)(1) of the Act, 21 U.S.C. section 360bbb-3(b)(1), unless the authorization is  terminated or revoked sooner.    . Influenza A by PCR 05/09/2020 NEGATIVE  NEGATIVE Final  . Influenza B by PCR 05/09/2020 NEGATIVE  NEGATIVE Final   Comment: (NOTE) The Xpert Xpress SARS-CoV-2/FLU/RSV assay is intended as an aid in  the diagnosis of influenza from Nasopharyngeal swab specimens and  should not be used as a sole basis for treatment. Nasal washings and  aspirates are unacceptable for Xpert Xpress SARS-CoV-2/FLU/RSV  testing.  Fact Sheet for Patients: https://www.moore.com/  Fact Sheet for Healthcare Providers: https://www.young.biz/  This test is not yet approved or cleared by the [place] FDA and  has been authorized for detection and/or diagnosis of SARS-CoV-2 by  FDA under an Emergency Use Authorization (EUA). This EUA will remain  in effect (meaning this test can be used) for the duration of the  Covid-19 declaration under Section 564(b)(1) of the Act, 21  U.S.C. section 360bbb-3(b)(1), unless the authorization is  terminated or revoked. Performed at [facility], [address] [place], [place] [address]   Admission on 05/08/2020, Discharged on 05/08/2020  Component Date Value Ref Range Status  . SARS Coronavirus 2 Ag 05/08/2020 Negative  Negative Preliminary  . WBC 05/08/2020 10.6* 4.0 - 10.5 K/uL Final  . RBC 05/08/2020 5.02  3.87 - 5.11 MIL/uL Final  . Hemoglobin 05/08/2020 11.8* 12.0 - 15.0 g/dL Final  . HCT 60/45/4098 38.3  36 - 46 % Final  . MCV 05/08/2020 76.3* 80.0 - 100.0 fL Final  . MCH 05/08/2020 23.5* 26.0 - 34.0 pg Final  . MCHC 05/08/2020 30.8  30.0 - 36.0 g/dL Final  . RDW 11/91/4782 14.8  11.5 - 15.5 %  Final  . Platelets 05/08/2020 432* 150 - 400 K/uL Final  . nRBC 05/08/2020 0.0  0.0 - 0.2 % Final  . Neutrophils Relative % 05/08/2020 74  % Final  . Neutro Abs 05/08/2020 7.8* 1.7 - 7.7 K/uL Final  . Lymphocytes Relative 05/08/2020 18  % Final  . Lymphs Abs 05/08/2020 1.9  0.7 - 4.0 K/uL Final  . Monocytes Relative 05/08/2020 4  % Final  . Monocytes Absolute 05/08/2020 0.5  0.1 - 1.0 K/uL Final  . Eosinophils Relative 05/08/2020 3  % Final  . Eosinophils Absolute 05/08/2020 0.3  0.0 - 0.5 K/uL Final  . Basophils Relative 05/08/2020 1  % Final  . Basophils Absolute 05/08/2020 0.1  0.0 - 0.1 K/uL Final  . Immature Granulocytes 05/08/2020 0  % Final  . Abs Immature Granulocytes 05/08/2020 0.04  0.00 - 0.07 K/uL Final   Performed at [facility], [address], [place], [place] [address]  . Sodium 05/08/2020 139  135 - 145 mmol/L Final  . Potassium 05/08/2020 3.4* 3.5 - 5.1 mmol/L Final  . Chloride 05/08/2020 104  98 - 111 mmol/L Final  . CO2 05/08/2020 25  22 - 32 mmol/L Final  . Glucose, Bld 05/08/2020 87  70 - 99 mg/dL Final   Glucose reference range applies only to samples taken after fasting for at least 8 hours.  . BUN 05/08/2020 9  6 - 20 mg/dL Final  . Creatinine, Ser 05/08/2020 0.91  0.44 - 1.00 mg/dL Final  . Calcium 30/86/5784 9.7  8.9 - 10.3 mg/dL Final  . Total Protein 05/08/2020 7.8  6.5 - 8.1 g/dL Final  . Albumin 69/62/9528 4.3  3.5 - 5.0 g/dL Final  . AST 41/32/4401 20  15 - 41 U/L Final  . ALT 05/08/2020 20  0 - 44  U/L Final  . Alkaline Phosphatase 05/08/2020 39  38 - 126 U/L Final  . Total Bilirubin 05/08/2020 1.1  0.3 - 1.2 mg/dL Final  . GFR, Estimated 05/08/2020 >60  >60 mL/min Final   Comment: (NOTE) Calculated using the CKD-EPI Creatinine Equation (2021)   . Anion gap 05/08/2020 10  5 - 15 Final   Performed at [facility], [address] [place], [place] [address]  . Hgb A1c MFr Bld 05/08/2020 5.1  4.8 - 5.6 % Final   Comment: (NOTE) Pre  diabetes:          5.7%-6.4%  Diabetes:              >6.4%  Glycemic control for   <7.0% adults with diabetes   . Mean Plasma Glucose 05/08/2020 99.67  mg/dL Final   Performed at [facility], [address] [place], [place] [address]  . Magnesium 05/08/2020 2.2  1.7 - 2.4 mg/dL Final   Performed at [facility], [address], [place], [place] [address]  . Alcohol, Ethyl (B) 05/08/2020 <10  <10 mg/dL Final   Comment: (NOTE) Lowest detectable limit for serum alcohol is 10 mg/dL.  For medical purposes only. Performed at [facility], [address], [place], [place] [address]   . Cholesterol 05/08/2020 177  0 - 200 mg/dL Final  . Triglycerides 05/08/2020 70  <150 mg/dL Final  . HDL 29/56/2130 69  >40 mg/dL Final  . Total CHOL/HDL Ratio 05/08/2020 2.6  RATIO Final  . VLDL 05/08/2020 14  0 - 40 mg/dL Final  . LDL Cholesterol 05/08/2020 94  0 - 99 mg/dL Final   Comment:        Total Cholesterol/HDL:CHD Risk Coronary Heart Disease Risk Table                     Men   Women  1/2 Average Risk   3.4   3.3  Average Risk       5.0   4.4  2 X Average Risk   9.6   7.1  3 X Average Risk  23.4   11.0        Use the calculated Patient Ratio above and the CHD Risk Table to determine the patient's CHD Risk.        ATP III CLASSIFICATION (LDL):  <100     mg/dL   Optimal  865-784  mg/dL   Near or Above                    Optimal  130-159  mg/dL   Borderline  696-295  mg/dL   High  >284     mg/dL   Very High Performed at [facility], [address], [place], [place] [address]   . TSH 05/08/2020 2.540  0.350 - 4.500 uIU/mL Final   Comment: Performed by a 3rd Generation assay with a functional sensitivity of <=0.01 uIU/mL. Performed at [facility], [address], [place], [place] [address]   . POC Amphetamine UR 05/08/2020 None Detected  None Detected Final  . POC Secobarbital (BAR) 05/08/2020 None Detected  None Detected Final  . POC Buprenorphine  (BUP) 05/08/2020 None Detected  None Detected Final  . POC Oxazepam (BZO) 05/08/2020 None Detected  None Detected Final  . POC Cocaine UR 05/08/2020 Positive* None Detected Final  . POC Methamphetamine UR 05/08/2020 None Detected  None Detected Final  .  POC Morphine 05/08/2020 None Detected  None Detected Final  . POC Oxycodone UR 05/08/2020 None Detected  None Detected Final  . POC Methadone UR 05/08/2020 None Detected  None Detected Final  . POC Marijuana UR 05/08/2020 None Detected  None Detected Final  . Glucose-Capillary 05/08/2020 93  70 - 99 mg/dL Final   Glucose reference range applies only to samples taken after fasting for at least 8 hours.  . Preg Test, Ur 05/08/2020 NEGATIVE  NEGATIVE Final   Comment:        THE SENSITIVITY OF THIS METHODOLOGY IS >24 mIU/mL   Admission on 03/27/2020, Discharged on 03/29/2020  Component Date Value Ref Range Status  . Opiates 03/28/2020 NONE DETECTED  NONE DETECTED Final  . Cocaine 03/28/2020 POSITIVE* NONE DETECTED Final  . Benzodiazepines 03/28/2020 NONE DETECTED  NONE DETECTED Final  . Amphetamines 03/28/2020 NONE DETECTED  NONE DETECTED Final  . Tetrahydrocannabinol 03/28/2020 POSITIVE* NONE DETECTED Final  . Barbiturates 03/28/2020 NONE DETECTED  NONE DETECTED Final   Comment: (NOTE) DRUG SCREEN FOR MEDICAL PURPOSES ONLY.  IF CONFIRMATION IS NEEDED FOR ANY PURPOSE, NOTIFY LAB WITHIN 5 DAYS.  LOWEST DETECTABLE LIMITS FOR URINE DRUG SCREEN Drug Class                     Cutoff (ng/mL) Amphetamine and metabolites    1000 Barbiturate and metabolites    200 Benzodiazepine                 200 Tricyclics and metabolites     300 Opiates and metabolites        300 Cocaine and metabolites        300 THC                            50 Performed at [facility], [address] [place], [place] [address]   Admission on 03/26/2020, Discharged on 03/27/2020  Component Date Value Ref Range Status  . SARS Coronavirus 2 by  RT PCR 03/26/2020 NEGATIVE  NEGATIVE Final   Comment: (NOTE) SARS-CoV-2 target nucleic acids are NOT DETECTED.  The SARS-CoV-2 RNA is generally detectable in upper respiratoy specimens during the acute phase of infection. The lowest concentration of SARS-CoV-2 viral copies this assay can detect is 131 copies/mL. A negative result does not preclude SARS-Cov-2 infection and should not be used as the sole basis for treatment or other patient management decisions. A negative result may occur with  improper specimen collection/handling, submission of specimen other than nasopharyngeal swab, presence of viral mutation(s) within the areas targeted by this assay, and inadequate number of viral copies (<131 copies/mL). A negative result must be combined with clinical observations, patient history, and epidemiological information. The expected result is Negative.  Fact Sheet for Patients:  https://www.moore.com/  Fact Sheet for Healthcare Providers:  https://www.young.biz/  This test is no                          t yet approved or cleared by the [place] FDA and  has been authorized for detection and/or diagnosis of SARS-CoV-2 by FDA under an Emergency Use Authorization (EUA). This EUA will remain  in effect (meaning this test can be used) for the duration of the COVID-19 declaration under Section 564(b)(1) of the Act, 21 U.S.C. section 360bbb-3(b)(1), unless the authorization is terminated or revoked sooner.    . Influenza A by  PCR 03/26/2020 NEGATIVE  NEGATIVE Final  . Influenza B by PCR 03/26/2020 NEGATIVE  NEGATIVE Final   Comment: (NOTE) The Xpert Xpress SARS-CoV-2/FLU/RSV assay is intended as an aid in  the diagnosis of influenza from Nasopharyngeal swab specimens and  should not be used as a sole basis for treatment. Nasal washings and  aspirates are unacceptable for Xpert Xpress SARS-CoV-2/FLU/RSV  testing.  Fact Sheet for  Patients: https://www.moore.com/  Fact Sheet for Healthcare Providers: https://www.young.biz/  This test is not yet approved or cleared by the [place] FDA and  has been authorized for detection and/or diagnosis of SARS-CoV-2 by  FDA under an Emergency Use Authorization (EUA). This EUA will remain  in effect (meaning this test can be used) for the duration of the  Covid-19 declaration under Section 564(b)(1) of the Act, 21  U.S.C. section 360bbb-3(b)(1), unless the authorization is  terminated or revoked. Performed at [facility], [address], [place], [place] [address]   . SARS Coronavirus 2 Ag 03/26/2020 Negative  Negative Preliminary  . WBC 03/26/2020 7.4  4.0 - 10.5 K/uL Final  . RBC 03/26/2020 4.54  3.87 - 5.11 MIL/uL Final  . Hemoglobin 03/26/2020 10.7* 12.0 - 15.0 g/dL Final  . HCT 19/14/7829 34.2* 36 - 46 % Final  . MCV 03/26/2020 75.3* 80.0 - 100.0 fL Final  . MCH 03/26/2020 23.6* 26.0 - 34.0 pg Final  . MCHC 03/26/2020 31.3  30.0 - 36.0 g/dL Final  . RDW 56/21/3086 15.2  11.5 - 15.5 % Final  . Platelets 03/26/2020 313  150 - 400 K/uL Final  . nRBC 03/26/2020 0.0  0.0 - 0.2 % Final  . Neutrophils Relative % 03/26/2020 44  % Final  . Neutro Abs 03/26/2020 3.3  1.7 - 7.7 K/uL Final  . Lymphocytes Relative 03/26/2020 43  % Final  . Lymphs Abs 03/26/2020 3.2  0.7 - 4.0 K/uL Final  . Monocytes Relative 03/26/2020 8  % Final  . Monocytes Absolute 03/26/2020 0.6  0.1 - 1.0 K/uL Final  . Eosinophils Relative 03/26/2020 4  % Final  . Eosinophils Absolute 03/26/2020 0.3  0.0 - 0.5 K/uL Final  . Basophils Relative 03/26/2020 1  % Final  . Basophils Absolute 03/26/2020 0.0  0.0 - 0.1 K/uL Final  . Immature Granulocytes 03/26/2020 0  % Final  . Abs Immature Granulocytes 03/26/2020 0.02  0.00 - 0.07 K/uL Final   Performed at [facility], [address], [place], [place] [address]  . Sodium 03/26/2020 138  135 - 145 mmol/L  Final  . Potassium 03/26/2020 3.2* 3.5 - 5.1 mmol/L Final  . Chloride 03/26/2020 106  98 - 111 mmol/L Final  . CO2 03/26/2020 22  22 - 32 mmol/L Final  . Glucose, Bld 03/26/2020 84  70 - 99 mg/dL Final   Glucose reference range applies only to samples taken after fasting for at least 8 hours.  . BUN 03/26/2020 8  6 - 20 mg/dL Final  . Creatinine, Ser 03/26/2020 0.87  0.44 - 1.00 mg/dL Final  . Calcium 96/29/5284 8.9  8.9 - 10.3 mg/dL Final  . Total Protein 03/26/2020 7.0  6.5 - 8.1 g/dL Final  . Albumin 13/24/4010 3.9  3.5 - 5.0 g/dL Final  . AST 27/25/3664 16  15 - 41 U/L Final  . ALT 03/26/2020 14  0 - 44 U/L Final  . Alkaline Phosphatase 03/26/2020 38  38 - 126 U/L Final  . Total Bilirubin 03/26/2020 1.1  0.3 -  1.2 mg/dL Final  . GFR, Estimated 03/26/2020 >60  >60 mL/min Final  . Anion gap 03/26/2020 10  5 - 15 Final   Performed at [facility], [address] [place], [place] [address]  . Hgb A1c MFr Bld 03/26/2020 4.9  4.8 - 5.6 % Final   Comment: (NOTE) Pre diabetes:          5.7%-6.4%  Diabetes:              >6.4%  Glycemic control for   <7.0% adults with diabetes   . Mean Plasma Glucose 03/26/2020 93.93  mg/dL Final   Performed at [facility], [address] [place], [place] [address]  . Cholesterol 03/26/2020 172  0 - 200 mg/dL Final  . Triglycerides 03/26/2020 55  <150 mg/dL Final  . HDL 93/81/0175 67  >40 mg/dL Final  . Total CHOL/HDL Ratio 03/26/2020 2.6  RATIO Final  . VLDL 03/26/2020 11  0 - 40 mg/dL Final  . LDL Cholesterol 03/26/2020 94  0 - 99 mg/dL Final   Comment:        Total Cholesterol/HDL:CHD Risk Coronary Heart Disease Risk Table                     Men   Women  1/2 Average Risk   3.4   3.3  Average Risk       5.0   4.4  2 X Average Risk   9.6   7.1  3 X Average Risk  23.4   11.0        Use the calculated Patient Ratio above and the CHD Risk Table to determine the patient's CHD Risk.        ATP III CLASSIFICATION (LDL):  <100      mg/dL   Optimal  102-585  mg/dL   Near or Above                    Optimal  130-159  mg/dL   Borderline  277-824  mg/dL   High  >235     mg/dL   Very High Performed at [facility], [address] [place], [place] [address]   . TSH 03/26/2020 1.998  0.350 - 4.500 uIU/mL Final   Comment: Performed by a 3rd Generation assay with a functional sensitivity of <=0.01 uIU/mL. Performed at [facility], [address], [place], [place] [address]   . SARS Coronavirus 2 Ag 03/26/2020 NEGATIVE  NEGATIVE Final   Comment: (NOTE) SARS-CoV-2 antigen NOT DETECTED.   Negative results are presumptive.  Negative results do not preclude SARS-CoV-2 infection and should not be used as the sole basis for treatment or other patient management decisions, including infection  control decisions, particularly in the presence of clinical signs and  symptoms consistent with COVID-19, or in those who have been in contact with the virus.  Negative results must be combined with clinical observations, patient history, and epidemiological information. The expected result is Negative.  Fact Sheet for Patients: https://sanders-williams.net/  Fact Sheet for Healthcare Providers: https://martinez.com/   This test is not yet approved or cleared by the [place] FDA and  has been authorized for detection and/or diagnosis of SARS-CoV-2 by FDA under an Emergency Use Authorization (EUA).  This EUA will remain in effect (meaning this test can be used) for the duration of  the C  OVID-19 declaration under Section 564(b)(1) of the Act, 21 U.S.C. section 360bbb-3(b)(1), unless the authorization is terminated or revoked sooner.    . Preg Test, Ur 03/27/2020 NEGATIVE  NEGATIVE Final   Comment:        THE SENSITIVITY OF THIS METHODOLOGY IS >24 mIU/mL   . Glucose, UA 03/27/2020 NEGATIVE  NEGATIVE mg/dL Final  . Bilirubin Urine 03/27/2020 SMALL*  NEGATIVE Final  . Ketones, ur 03/27/2020 15* NEGATIVE mg/dL Final  . Specific Gravity, Urine 03/27/2020 >=1.030  1.005 - 1.030 Final  . Hgb urine dipstick 03/27/2020 LARGE* NEGATIVE Final  . pH 03/27/2020 6.5  5.0 - 8.0 Final  . Protein, ur 03/27/2020 100* NEGATIVE mg/dL Final  . Urobilinogen, UA 03/27/2020 2.0* 0.0 - 1.0 mg/dL Final  . Nitrite 84/13/2440 NEGATIVE  NEGATIVE Final  . Glori Luis 03/27/2020 NEGATIVE  NEGATIVE Final   Biochemical Testing Only. Please order routine urinalysis from main lab if confirmatory testing is needed.  Admission on 03/26/2020, Discharged on 03/26/2020  Component Date Value Ref Range Status  . Preg Test, Ur 03/26/2020 NEGATIVE  NEGATIVE Final   Comment:        THE SENSITIVITY OF THIS METHODOLOGY IS >24 mIU/mL   Admission on 01/23/2020, Discharged on 01/23/2020  Component Date Value Ref Range Status  . Color, UA 01/23/2020 yellow  yellow Final  . Clarity, UA 01/23/2020 clear  clear Final  . Glucose, UA 01/23/2020 negative  negative mg/dL Final  . Bilirubin, UA 01/23/2020 negative  negative Final  . Ketones, POC UA 01/23/2020 negative  negative mg/dL Final  . Spec Grav, UA 01/23/2020 >=1.030* 1.010 - 1.025 Final  . Blood, UA 01/23/2020 negative  negative Final  . pH, UA 01/23/2020 6.5  5.0 - 8.0 Final  . Protein Ur, POC 01/23/2020 negative  negative mg/dL Final  . Urobilinogen, UA 01/23/2020 1.0  0.2 or 1.0 E.U./dL Final  . Nitrite, UA 04/15/2535 Negative  Negative Final  . Leukocytes, UA 01/23/2020 Negative  Negative Final  . Preg Test, Ur 01/23/2020 Negative  Negative Final  . Trichomonas 01/23/2020 Negative   Final  . Chlamydia 01/23/2020 Negative   Final  . Neisseria Gonorrhea 01/23/2020 Negative   Final  . Comment 01/23/2020 Normal Reference Ranger Chlamydia - Negative   Final  . Comment 01/23/2020 Normal Reference Range Trichomonas - Negative   Final  . Comment 01/23/2020 Normal Reference Range Neisseria Gonorrhea - Negative   Final   Admission on 01/19/2020, Discharged on 01/19/2020  Component Date Value Ref Range Status  . Lipase 01/19/2020 30  11 - 51 U/L Final   Performed at [facility], [address] [place], [place] [address]  . Sodium 01/19/2020 137  135 - 145 mmol/L Final  . Potassium 01/19/2020 3.5  3.5 - 5.1 mmol/L Final  . Chloride 01/19/2020 108  98 - 111 mmol/L Final  . CO2 01/19/2020 22  22 - 32 mmol/L Final  . Glucose, Bld 01/19/2020 92  70 - 99 mg/dL Final   Glucose reference range applies only to samples taken after fasting for at least 8 hours.  . BUN 01/19/2020 8  6 - 20 mg/dL Final  . Creatinine, Ser 01/19/2020 0.78  0.44 - 1.00 mg/dL Final  . Calcium 47/42/5956 9.1  8.9 - 10.3 mg/dL Final  . Total Protein 01/19/2020 7.2  6.5 - 8.1 g/dL Final  . Albumin 38/75/6433 3.7  3.5 - 5.0 g/dL Final  . AST 29/51/8841 18  15 - 41 U/L Final  . ALT  01/19/2020 19  0 - 44 U/L Final  . Alkaline Phosphatase 01/19/2020 38  38 - 126 U/L Final  . Total Bilirubin 01/19/2020 1.2  0.3 - 1.2 mg/dL Final  . GFR calc non Af Amer 01/19/2020 >60  >60 mL/min Final  . GFR calc Af Amer 01/19/2020 >60  >60 mL/min Final  . Anion gap 01/19/2020 7  5 - 15 Final   Performed at [facility], [address] [place], [place] [address]  . WBC 01/19/2020 10.2  4.0 - 10.5 K/uL Final  . RBC 01/19/2020 4.70  3.87 - 5.11 MIL/uL Final  . Hemoglobin 01/19/2020 10.8* 12.0 - 15.0 g/dL Final  . HCT 57/84/696208/07/2019 34.9* 36 - 46 % Final  . MCV 01/19/2020 74.3* 80.0 - 100.0 fL Final  . MCH 01/19/2020 23.0* 26.0 - 34.0 pg Final  . MCHC 01/19/2020 30.9  30.0 - 36.0 g/dL Final  . RDW 95/28/413208/07/2019 15.4  11.5 - 15.5 % Final  . Platelets 01/19/2020 419* 150 - 400 K/uL Final  . nRBC 01/19/2020 0.0  0.0 - 0.2 % Final   Performed at [facility], [address] [place], [place] [address]  . Color, Urine 01/19/2020 YELLOW  YELLOW Final  . APPearance 01/19/2020 CLEAR  CLEAR Final  . Specific Gravity, Urine 01/19/2020 1.016  1.005 -  1.030 Final  . pH 01/19/2020 5.0  5.0 - 8.0 Final  . Glucose, UA 01/19/2020 NEGATIVE  NEGATIVE mg/dL Final  . Hgb urine dipstick 01/19/2020 NEGATIVE  NEGATIVE Final  . Bilirubin Urine 01/19/2020 NEGATIVE  NEGATIVE Final  . Ketones, ur 01/19/2020 5* NEGATIVE mg/dL Final  . Protein, ur 27/25/366408/07/2019 NEGATIVE  NEGATIVE mg/dL Final  . Nitrite 40/34/742508/07/2019 NEGATIVE  NEGATIVE Final  . Glori LuisLeukocytes,Ua 01/19/2020 NEGATIVE  NEGATIVE Final   Performed at [facility], [address] [place], [place] [address]  . I-stat hCG, quantitative 01/19/2020 <5.0  <5 mIU/mL Final  . Comment 3 01/19/2020          Final   Comment:   GEST. AGE      CONC.  (mIU/mL)   <=1 WEEK        5 - 50     2 WEEKS       50 - 500     3 WEEKS       100 - 10,000     4 WEEKS     1,000 - 30,000        FEMALE AND NON-PREGNANT FEMALE:     LESS THAN 5 mIU/mL   Admission on 11/20/2019, Discharged on 11/20/2019  Component Date Value Ref Range Status  . Preg Test, Ur 11/20/2019 Negative  Negative Final  . Color, UA 11/20/2019 yellow  yellow Final  . Clarity, UA 11/20/2019 cloudy* clear Final  . Glucose, UA 11/20/2019 negative  negative mg/dL Final  . Bilirubin, UA 11/20/2019 negative  negative Final  . Ketones, POC UA 11/20/2019 negative  negative mg/dL Final  . Spec Grav, UA 11/20/2019 1.015  1.010 - 1.025 Final  . Blood, UA 11/20/2019 negative  negative Final  . pH, UA 11/20/2019 8.5* 5.0 - 8.0 Final  . Protein Ur, POC 11/20/2019 negative  negative mg/dL Final  . Urobilinogen, UA 11/20/2019 0.2  0.2 or 1.0 E.U./dL Final  . Nitrite, UA 75/64/332906/08/2019 Negative  Negative Final  . Leukocytes, UA 11/20/2019 Large (3+)* Negative Final  . Neisseria Gonorrhea 11/20/2019 Positive*  Final  . Chlamydia 11/20/2019 Negative   Final  .  Trichomonas 11/20/2019 Negative   Final  . Comment 11/20/2019 Normal Reference Range Trichomonas - Negative   Final  . Comment 11/20/2019 Normal Reference Ranger Chlamydia - Negative   Final  . Comment  11/20/2019 Normal Reference Range Neisseria Gonorrhea - Negative   Final  . Specimen Description 11/20/2019 URINE, RANDOM   Final  . Special Requests 11/20/2019 NONE   Final  . Culture 11/20/2019    Final                   Value:NO GROWTH Performed at [facility], [address] [place], [place] [address]   . Report Status 11/20/2019 11/22/2019 FINAL   Final    Allergies: Naproxen  PTA Medications: (Not in a hospital admission)   Medical Decision Making      Recommendations  Based on my evaluation the patient appears to have an emergency medical condition for which I recommend the patient be transferred to the emergency department for further evaluation.  Gillermo Murdoch, NP 05/09/20  6:20 AM

## 2020-05-09 NOTE — ED Notes (Signed)
Pt sleeping@this time. Breathing even and unlabored. Will continue to monitor pt for safety 

## 2020-05-09 NOTE — ED Provider Notes (Signed)
FBC/OBS ASAP Discharge Summary  Date and Time: 05/09/2020 2:07 PM  Name: Debra Sullivan  MRN:  [id number]   Discharge Diagnoses:  Final diagnoses:  Cocaine use disorder, moderate, in early remission [facility](HCC)  MDD (major depressive disorder), single episode, severe with psychosis (HCC)    Subjective: Patient states "a lot of things went on last night." Patient reports readiness to discharge home. Patient reports she is currently homeless in [place], would like information and resources for shelters in the area. Patient reports she currently has no picture identification.  Patient reports "I use cocaine sometimes." Patient reports last use of cocaine on last night."  Patient homeless in [place]. Patient denies access to weapons. Patient is currently not employed. Patient denies alcohol use. Patient denies substance use aside from cocaine. Patient does not disclose how much cocaine she typically uses. Patient reports she is currently not interested in substance use treatment. Patient endorses average sleep and appetite.  Patient assessed by nurse practitioner. Patient alert and oriented, answers appropriately. Patient pleasant cooperative during assessment. Patient denies suicidal and homicidal ideations. Patient denies both auditory and visual hallucinations. There is no evidence of delusional thought content and no indication that patient is responding to internal stimuli. Patient denies symptoms of paranoia.  Patient has been diagnosed with major depressive disorder as well as bipolar disorder per medical record. Patient has also been diagnosed with PTSD in the past. Patient reports she is [facility] outpatient [facility] with Dr. Merlyn AlbertFred. Patient reports her home medications include Risperdal and trazodone. Patient reports compliance with medications. Patient reports she is uncertain when her next appointment with [facility] is but plans to call next week to confirm.  Patient offered  support and encouragement.  Patient gives verbal consent to speak with her child's father, Arvella MerlesDexter Haskins phone number [phone]. Attempted to call, HIPAA compliant voicemail left.  Stay Summary:  Per TTS consult: [facility] by [facility].  They had gotten reports of someone getting in and out of cars.  Pt was found by [employer] and [facility].  [facility] (within the last 24 hours but left AMA.  She was agitated at that time.   Patient is quiet at the start of intake.  She does not talk much.  She looks off in space and does not make good eye contact.  Patient denies any SI.  She shakes her head "no" when asked about SI and HI.  She looks like she may be hearing things.   Patient did agree to [facility] after Elenore PaddyJackie Thompson, NP saw her.  Pt was given oral medication to help with agitation.     Pt is now talking loudly in the adult observation area.  She is responding to internal stimuli and is talking about "her baby" and how she can take care of herself.  Pt is having conversations with persons not present.     Visit Diagnosis: Schizoaffective    Total Time spent with patient: 30 minutes  Past Psychiatric History: PTSD, major depressive disorder, bipolar 1 disorder, cocaine use disorder, cannabis use disorder Past Medical History:  Past Medical History:  Diagnosis Date  . Anemia   . Drug abuse (HCC)   . Psychosis (HCC) 2013    Past Surgical History:  Procedure Laterality Date  . CESAREAN SECTION  03/2016   [facility]  . LAPAROSCOPY N/A 04/14/2017   Procedure: LAPAROSCOPY OPERATIVE WITH RIGHT SALPINGECTOMY;  Surgeon: Conan Bowens, MD;  Location: [facility] OR[facility];  Service: Gynecology;  Laterality: N/A;  . TUBAL LIGATION Bilateral 01/22/2018   Procedure: POST PARTUM TUBAL LIGATION;  Surgeon: [name], DO;  Location: [facility]IRTHING SU[facility];  Service: Gynecology;  Laterality: Bilateral;   Family History:  Family History  Problem  Relation Age of Onset  . Diabetes Mother   . Schizophrenia Mother   . Diabetes Brother   . Hypertension Maternal Aunt   . Healthy Father    Family Psychiatric History: None reported Social History:  Social History   Substance and Sexual Activity  Alcohol Use Not Currently  . Alcohol/week: 0.0 standard drinks   Comment: socially     Social History   Substance and Sexual Activity  Drug Use Not Currently  . Types: Marijuana, Cocaine   Comment: Cocaine & Marijuana was used10/26/2018    Social History   Socioeconomic History  . Marital status: Single    Spouse name: Not on file  . Number of children: Not on file  . Years of education: Not on file  . Highest education level: Not on file  Occupational History  . Not on file  Tobacco Use  . Smoking status: Current Every Day Smoker    Packs/day: 0.25    Types: Cigarettes  . Smokeless tobacco: Never Used  Vaping Use  . Vaping Use: Never used  Substance and Sexual Activity  . Alcohol use: Not Currently    Alcohol/week: 0.0 standard drinks    Comment: socially  . Drug use: Not Currently    Types: Marijuana, Cocaine    Comment: Cocaine & Marijuana was used10/26/2018  . Sexual activity: Yes    Birth control/protection: None  Other Topics Concern  . Not on file  Social History Narrative  . Not on file   Social Determinants of Health   Financial Resource Strain:   . Difficulty of Paying Living Expenses: Not on file  Food Insecurity:   . Worried About Programme researcher, broadcasting/film/video in the Last Year: Not on file  . Ran Out of Food in the Last Year: Not on file  Transportation Needs:   . Lack of Transportation (Medical): Not on file  . Lack of Transportation (Non-Medical): Not on file  Physical Activity:   . Days of Exercise per Week: Not on file  . Minutes of Exercise per Session: Not on file  Stress:   . Feeling of Stress : Not on file  Social Connections:   . Frequency of Communication with Friends and Family: Not on file   . Frequency of Social Gatherings with Friends and Family: Not on file  . Attends Religious Services: Not on file  . Active Member of Clubs or Organizations: Not on file  . Attends Banker Meetings: Not on file  . Marital Status: Not on file   SDOH:  SDOH Screenings   Alcohol Screen: Low Risk   . Last Alcohol Screening Score (AUDIT): 0  Depression (PHQ2-9): Medium Risk  . PHQ-2 Score: 19  Financial Resource Strain:   . Difficulty of Paying Living Expenses: Not on file  Food Insecurity:   . Worried About Programme researcher, broadcasting/film/video in the Last Year: Not on file  . Ran Out of Food in the Last Year: Not on file  Housing:   . Last Housing Risk Score: Not on file  Physical Activity:   . Days of Exercise per Week: Not on file  . Minutes of Exercise per Session:  Not on file  Social Connections:   . Frequency of Communication with Friends and Family: Not on file  . Frequency of Social Gatherings with Friends and Family: Not on file  . Attends Religious Services: Not on file  . Active Member of Clubs or Organizations: Not on file  . Attends Banker Meetings: Not on file  . Marital Status: Not on file  Stress:   . Feeling of Stress : Not on file  Tobacco Use: High Risk  . Smoking Tobacco Use: Current Every Day Smoker  . Smokeless Tobacco Use: Never Used  Transportation Needs:   . Freight forwarder (Medical): Not on file  . Lack of Transportation (Non-Medical): Not on file    Has this patient used any form of tobacco in the last 30 days? (Cigarettes, Smokeless Tobacco, Cigars, and/or Pipes) A prescription for an FDA-approved tobacco cessation medication was offered at discharge and the patient refused  Current Medications:  Current Facility-Administered Medications  Medication Dose Route Frequency Provider Last Rate Last Admin  . acetaminophen (TYLENOL) tablet 650 mg  650 mg Oral Q6H PRN Gillermo Murdoch, NP      . alum & mag hydroxide-simeth  (MAALOX/MYLANTA) 200-200-20 MG/5ML suspension 30 mL  30 mL Oral Q4H PRN Gillermo Murdoch, NP      . hydrOXYzine (ATARAX/VISTARIL) tablet 25 mg  25 mg Oral TID PRN Gillermo Murdoch, NP   25 mg at 05/09/20 0105  . magnesium hydroxide (MILK OF MAGNESIA) suspension 30 mL  30 mL Oral Daily PRN Gillermo Murdoch, NP      . risperiDONE (RISPERDAL M-TABS) disintegrating tablet 1 mg  1 mg Oral QHS Gillermo Murdoch, NP   1 mg at 05/09/20 0104  . traZODone (DESYREL) tablet 50 mg  50 mg Oral QHS PRN Gillermo Murdoch, NP   50 mg at 05/09/20 0105   Current Outpatient Medications  Medication Sig Dispense Refill  . acetaminophen (TYLENOL) 325 MG tablet Take 650 mg by mouth every 6 (six) hours as needed for mild pain or headache.    . hydrOXYzine (ATARAX/VISTARIL) 25 MG tablet Take 1 tablet (25 mg total) by mouth 3 (three) times daily as needed for anxiety. (Patient not taking: Reported on 05/09/2020) 30 tablet 0  . risperiDONE (RISPERDAL M-TABS) 1 MG disintegrating tablet Take 1 tablet (1 mg total) by mouth at bedtime. 30 tablet 0  . traZODone (DESYREL) 50 MG tablet Take 1 tablet (50 mg total) by mouth at bedtime as needed for sleep. (Patient not taking: Reported on 05/09/2020) 15 tablet 0    PTA Medications: (Not in a hospital admission)   Musculoskeletal  Strength & Muscle Tone: within normal limits Gait & Station: normal Patient leans: N/A  Psychiatric Specialty Exam  Presentation  General Appearance: Casual  Eye Contact:Fair  Speech:Clear and Coherent;Normal Rate  Speech Volume:Normal  Handedness:Right   Mood and Affect  Mood:Depressed  Affect:Appropriate;Congruent   Thought Process  Thought Processes:Coherent;Goal Directed  Descriptions of Associations:Intact  Orientation:Full (Time, Place and Person)  Thought Content:Logical;WDL  Hallucinations:Hallucinations: None  Ideas of Reference:None  Suicidal Thoughts:Suicidal Thoughts: No  Homicidal  Thoughts:Homicidal Thoughts: No   Sensorium  Memory:Immediate Fair;Recent Fair;Remote Fair  Judgment:Fair  Insight:Fair   Executive Functions  Concentration:Good  Attention Span:Good  Recall:Good  Fund of Knowledge:Good  Language:Good   Psychomotor Activity  Psychomotor Activity:Psychomotor Activity: Normal   Assets  Assets:Communication Skills;Desire for Improvement;Physical Health;Resilience;Social Support   Sleep  Sleep:Sleep: Fair   Physical Exam  Physical Exam Vitals and nursing note  reviewed.  Constitutional:      Appearance: She is well-developed.  HENT:     Head: Normocephalic.  Cardiovascular:     Rate and Rhythm: Normal rate.  Pulmonary:     Effort: Pulmonary effort is normal.  Neurological:     Mental Status: She is alert and oriented to person, place, and time.  Psychiatric:        Attention and Perception: Attention and perception normal.        Mood and Affect: Affect normal. Mood is depressed.        Speech: Speech normal.        Behavior: Behavior normal. Behavior is cooperative.        Thought Content: Thought content normal.        Cognition and Memory: Cognition and memory normal.        Judgment: Judgment normal.    Review of Systems  Constitutional: Negative.   HENT: Negative.   Eyes: Negative.   Respiratory: Negative.   Cardiovascular: Negative.   Gastrointestinal: Negative.   Genitourinary: Negative.   Musculoskeletal: Negative.   Skin: Negative.   Neurological: Negative.   Endo/Heme/Allergies: Negative.   Psychiatric/Behavioral: Positive for depression and substance abuse.   Blood pressure (!) 122/91, pulse 99, temperature 97.7 F (36.5 C), temperature source Tympanic, resp. rate 18, SpO2 100 %. There is no height or weight on file to calculate BMI.  Demographic Factors:  Unemployed  Loss Factors: NA  Historical Factors: NA  Risk Reduction Factors:   Responsible for children under 75 years of age, Positive  social support, Positive therapeutic relationship and Positive coping skills or problem solving skills  Continued Clinical Symptoms:  Alcohol/Substance Abuse/Dependencies  Cognitive Features That Contribute To Risk:  None    Suicide Risk:  Minimal: No identifiable suicidal ideation.  Patients presenting with no risk factors but with morbid ruminations; may be classified as minimal risk based on the severity of the depressive symptoms  Plan Of Care/Follow-up recommendations:  Other:  Follow-up with established outpatient psychiatry.  Patient given homeless resources. Patient reviewed with Dr. Bronwen Betters,  Medications: -Hydroxyzine 25 mg 3 times daily as needed/anxiety -Risperidone 1 mg nightly/mood -Trazodone 50 mg nightly as needed/sleep   Disposition: Discharge  Patrcia Dolly, FNP 05/09/2020, 2:07 PM

## 2020-05-09 NOTE — ED Notes (Signed)
Pt very agitated, talking loudly, disturbing the milieu.  NP Annice Pih notified.Marland Kitchen

## 2020-05-09 NOTE — ED Triage Notes (Signed)
[employer] transport, pt presents with complaint of psychosis and getting in and out of cars in the streets.  Pt slightly agitated, rambling.  Denies SI, HI.

## 2020-05-10 MED ORDER — RISPERIDONE 1 MG PO TBDP: 1.0000 mg | ORAL_TABLET | Freq: Every day | ORAL | 0 refills | Status: DC

## 2020-05-10 MED ORDER — HYDROXYZINE HCL 25 MG PO TABS: 25.0000 mg | ORAL_TABLET | Freq: Three times a day (TID) | ORAL | 0 refills | Status: DC | PRN

## 2020-05-10 MED ORDER — TRAZODONE HCL 50 MG PO TABS: 50.0000 mg | ORAL_TABLET | Freq: Every evening | ORAL | 0 refills | Status: DC | PRN

## 2020-05-10 NOTE — ED Notes (Signed)
Pt sleeping. Safety maintained. Will continue to monitor.  

## 2020-05-10 NOTE — ED Notes (Signed)
Pt sleeping@this time. Breathing even and unlabored. Will continue to monitor for safety 

## 2020-05-10 NOTE — ED Provider Notes (Addendum)
FBC/OBS ASAP Discharge Summary  Date and Time: 05/10/2020 1:28 PM  Name: Debra MaineBridget A Scheper  MRN:  [id number]   Discharge Diagnoses:  Final diagnoses:  Cocaine use disorder, moderate, in early remission [facility](HCC)  MDD (major depressive disorder), single episode, severe with psychosis (HCC)    Subjective: Patient reports that she is feeling much better today and denies any suicidal homicidal ideations and denies any hallucinations.  Patient reported that she was discharged yesterday with plans to go to a shelter, however, the shelter had a cut off time, she could not get the answer she returned here.  Patient states that the medication she was given really did help her and that she was able to sleep and feels better today.  Patient reports that she is interested in [facility]uscular mission and asked where she was wanting to go to yesterday.  Patient is requesting assistance with transportation [facility] as well as prescriptions for her medications.  Stay Summary: Patient is a 34 year old female who presented to the [facility] reporting suicidal ideations due to being homeless and cocaine abuse.  Patient was maintained overnight previously and then was discharged in the next day with plan to go to a shelter.  Patient was able to get into a shelter and then [facility].  Patient was admitted to continuous observation unit for overnight assessment.  Today patient reported feeling much better and stated that she still wanted to return to a shelter.  Patient was started on Risperdal M tabs 1 mg p.o. nightly and trazodone 50 mg p.o. nightly.  Patient reported improvement with sleep and mood. Patient was provided with information for [facility] and [facility]. Patient was provided with 7 day samples and 30 day prescriptions of her medications and provided with transportation [facility].  Total Time spent with patient: 30 minutes  Past Psychiatric History:  polysubstance use, psychosis Past Medical History:  Past Medical History:  Diagnosis Date  . Anemia   . Drug abuse (HCC)   . Psychosis (HCC) 2013    Past Surgical History:  Procedure Laterality Date  . CESAREAN SECTION  03/2016   [facility]  . LAPAROSCOPY N/A 04/14/2017   Procedure: LAPAROSCOPY OPERATIVE WITH RIGHT SALPINGECTOMY;  Surgeon: Conan Bowensavis, Kelly M, MD;  Location: [facility];  Service: Gynecology;  Laterality: N/A;  . TUBAL LIGATION Bilateral 01/22/2018   Procedure: POST PARTUM TUBAL LIGATION;  Surgeon: Levie HeritageStinson, Jacob J, DO;  Location: [facility];  Service: Gynecology;  Laterality: Bilateral;   Family History:  Family History  Problem Relation Age of Onset  . Diabetes Mother   . Schizophrenia Mother   . Diabetes Brother   . Hypertension Maternal Aunt   . Healthy Father    Family Psychiatric History: None reported Social History:  Social History   Substance and Sexual Activity  Alcohol Use Not Currently  . Alcohol/week: 0.0 standard drinks   Comment: socially     Social History   Substance and Sexual Activity  Drug Use Not Currently  . Types: Marijuana, Cocaine   Comment: Cocaine & Marijuana was used10/26/2018    Social History   Socioeconomic History  . Marital status: Single    Spouse name: Not on file  . Number of children: Not on file  . Years of education: Not on file  . Highest education level: Not on file  Occupational History  . Not on file  Tobacco Use  .  Smoking status: Current Every Day Smoker    Packs/day: 0.25    Types: Cigarettes  . Smokeless tobacco: Never Used  Vaping Use  . Vaping Use: Never used  Substance and Sexual Activity  . Alcohol use: Not Currently    Alcohol/week: 0.0 standard drinks    Comment: socially  . Drug use: Not Currently    Types: Marijuana, Cocaine    Comment: Cocaine & Marijuana was used10/26/2018  . Sexual activity: Yes    Birth control/protection: None  Other Topics Concern  . Not  on file  Social History Narrative  . Not on file   Social Determinants of Health   Financial Resource Strain:   . Difficulty of Paying Living Expenses: Not on file  Food Insecurity:   . Worried About Programme researcher, broadcasting/film/video in the Last Year: Not on file  . Ran Out of Food in the Last Year: Not on file  Transportation Needs:   . Lack of Transportation (Medical): Not on file  . Lack of Transportation (Non-Medical): Not on file  Physical Activity:   . Days of Exercise per Week: Not on file  . Minutes of Exercise per Session: Not on file  Stress:   . Feeling of Stress : Not on file  Social Connections:   . Frequency of Communication with Friends and Family: Not on file  . Frequency of Social Gatherings with Friends and Family: Not on file  . Attends Religious Services: Not on file  . Active Member of Clubs or Organizations: Not on file  . Attends Banker Meetings: Not on file  . Marital Status: Not on file   SDOH:  SDOH Screenings   Alcohol Screen: Low Risk   . Last Alcohol Screening Score (AUDIT): 0  Depression (PHQ2-9): Medium Risk  . PHQ-2 Score: 19  Financial Resource Strain:   . Difficulty of Paying Living Expenses: Not on file  Food Insecurity:   . Worried About Programme researcher, broadcasting/film/video in the Last Year: Not on file  . Ran Out of Food in the Last Year: Not on file  Housing:   . Last Housing Risk Score: Not on file  Physical Activity:   . Days of Exercise per Week: Not on file  . Minutes of Exercise per Session: Not on file  Social Connections:   . Frequency of Communication with Friends and Family: Not on file  . Frequency of Social Gatherings with Friends and Family: Not on file  . Attends Religious Services: Not on file  . Active Member of Clubs or Organizations: Not on file  . Attends Banker Meetings: Not on file  . Marital Status: Not on file  Stress:   . Feeling of Stress : Not on file  Tobacco Use: High Risk  . Smoking Tobacco Use:  Current Every Day Smoker  . Smokeless Tobacco Use: Never Used  Transportation Needs:   . Freight forwarder (Medical): Not on file  . Lack of Transportation (Non-Medical): Not on file    Has this patient used any form of tobacco in the last 30 days? (Cigarettes, Smokeless Tobacco, Cigars, and/or Pipes) A prescription for an FDA-approved tobacco cessation medication was offered at discharge and the patient refused  Current Medications:  Current Facility-Administered Medications  Medication Dose Route Frequency Provider Last Rate Last Admin  . acetaminophen (TYLENOL) tablet 650 mg  650 mg Oral Q6H PRN Gillermo Murdoch, NP      . alum & mag hydroxide-simeth (MAALOX/MYLANTA) 200-200-20  MG/5ML suspension 30 mL  30 mL Oral Q4H PRN Gillermo Murdoch, NP      . hydrOXYzine (ATARAX/VISTARIL) tablet 25 mg  25 mg Oral TID PRN Gillermo Murdoch, NP   25 mg at 05/09/20 0105  . magnesium hydroxide (MILK OF MAGNESIA) suspension 30 mL  30 mL Oral Daily PRN Gillermo Murdoch, NP      . risperiDONE (RISPERDAL M-TABS) disintegrating tablet 1 mg  1 mg Oral QHS Gillermo Murdoch, NP   1 mg at 05/09/20 2147  . traZODone (DESYREL) tablet 50 mg  50 mg Oral QHS PRN Gillermo Murdoch, NP   50 mg at 05/09/20 0105   Current Outpatient Medications  Medication Sig Dispense Refill  . acetaminophen (TYLENOL) 325 MG tablet Take 650 mg by mouth every 6 (six) hours as needed for mild pain or headache.    . hydrOXYzine (ATARAX/VISTARIL) 25 MG tablet Take 1 tablet (25 mg total) by mouth 3 (three) times daily as needed for anxiety. 30 tablet 0  . risperiDONE (RISPERDAL M-TABS) 1 MG disintegrating tablet Take 1 tablet (1 mg total) by mouth at bedtime. 30 tablet 0  . traZODone (DESYREL) 50 MG tablet Take 1 tablet (50 mg total) by mouth at bedtime as needed for sleep. 30 tablet 0    PTA Medications: (Not in a hospital admission)   Musculoskeletal  Strength & Muscle Tone: within normal limits Gait &  Station: normal Patient leans: N/A  Psychiatric Specialty Exam  Presentation  General Appearance: Casual  Eye Contact:Fair  Speech:Clear and Coherent;Normal Rate  Speech Volume:Normal  Handedness:Right   Mood and Affect  Mood:Depressed  Affect:Appropriate;Congruent   Thought Process  Thought Processes:Coherent;Goal Directed  Descriptions of Associations:Intact  Orientation:Full (Time, Place and Person)  Thought Content:Logical;WDL  Hallucinations:Hallucinations: None  Ideas of Reference:None  Suicidal Thoughts:Suicidal Thoughts: No  Homicidal Thoughts:Homicidal Thoughts: No   Sensorium  Memory:Immediate Fair;Recent Fair;Remote Fair  Judgment:Fair  Insight:Fair   Executive Functions  Concentration:Good  Attention Span:Good  Recall:Good  Fund of Knowledge:Good  Language:Good   Psychomotor Activity  Psychomotor Activity:Psychomotor Activity: Normal   Assets  Assets:Communication Skills;Desire for Improvement;Physical Health;Resilience;Social Support   Sleep  Sleep:Sleep: Fair   Physical Exam  Physical Exam Vitals and nursing note reviewed.  Constitutional:      Appearance: She is well-developed.  HENT:     Head: Normocephalic.  Eyes:     Pupils: Pupils are equal, round, and reactive to light.  Cardiovascular:     Rate and Rhythm: Normal rate.  Pulmonary:     Effort: Pulmonary effort is normal.  Musculoskeletal:        General: Normal range of motion.  Neurological:     Mental Status: She is alert and oriented to person, place, and time.    Review of Systems  Constitutional: Negative.   HENT: Negative.   Eyes: Negative.   Respiratory: Negative.   Cardiovascular: Negative.   Gastrointestinal: Negative.   Genitourinary: Negative.   Musculoskeletal: Negative.   Skin: Negative.   Neurological: Negative.   Endo/Heme/Allergies: Negative.   Psychiatric/Behavioral: Positive for substance abuse.   Blood pressure 104/60, pulse  (!) 110, temperature 98 F (36.7 C), temperature source Oral, resp. rate 18, SpO2 100 %. There is no height or weight on file to calculate BMI.  Demographic Factors:  Low socioeconomic status and Unemployed  Loss Factors: NA  Historical Factors: NA  Risk Reduction Factors:   Living with another person, especially a relative, Positive social support and Positive therapeutic relationship  Continued Clinical  Symptoms:  Alcohol/Substance Abuse/Dependencies Previous Psychiatric Diagnoses and Treatments  Cognitive Features That Contribute To Risk:  None    Suicide Risk:  Mild:  Suicidal ideation of limited frequency, intensity, duration, and specificity.  There are no identifiable plans, no associated intent, mild dysphoria and related symptoms, good self-control (both objective and subjective assessment), few other risk factors, and identifiable protective factors, including available and accessible social support.  Plan Of Care/Follow-up recommendations:  Continue activity as tolerated. Continue diet as recommended by your PCP. Ensure to keep all appointments with outpatient providers.  Disposition: Discharged to [facility]  Maryfrances Bunnell, FNP 05/10/2020, 1:28 PM

## 2020-05-10 NOTE — ED Notes (Signed)
Breakfast given.  

## 2020-05-10 NOTE — ED Notes (Signed)
Discharge instructions provided. Pt stated understanding. [facility] called to provide services to [employer] at [facility] [address] [place]. Safety maintained.

## 2020-05-10 NOTE — ED Provider Notes (Signed)
Behavioral Health Admission H&P [facility] & OBS)  Date: 05/09/20 Patient Name: Debra Sullivan MRN: [id number] Chief Complaint:  Chief Complaint  Patient presents with  . Agitation  . Altered Mental Status      Diagnoses: Schizoaffective d/o.   Final diagnoses:  Cocaine use disorder, moderate, in early remission [facility])  MDD (major depressive disorder), single episode, severe with psychosis (HCC)   HPI: Debra Sullivan is a 34 y/o female patient who was brought to [facility] by [employer]. They had gotten reports of someone getting in and out of cars in the neighborhood. The patient was found by [employer] and brought to [facility]. The patient was at [facility] last night (within the previous 24 hours but left AMA. She was agitated at that time.   The patient is calmer tonight than the previous night. She continues to present with auditory hallucinations. At times she becomes loud; she can be redirectable, which is better than the last night. The patient agrees with allowing the nurse to do her initial health assessment. Once in the assessment room, she begins to refuse care. The patient had to be redirectable. She calms down and looks off in space, and does not make good eye contact.   The patient begins to talk loudly once in the adult observation area. She is responding to internal stimuli and is talking about "her baby" and how she can take care of herself. The patient is having conversations with persons not present. The patient is disrupting the unit, awaking other patients, cursing at staff members. The security office has to be in the area to prevent the patient from becoming physically aggressive.  The patient was given her Risperdal 1 mg M-tabs, and throughout one and a half hours to two hours, she was given 2 mg Ativan, 50 mg Benadryl, and 5 mg of Haldol. The patient took a shower, and she was able to calm down and get some rest.    PHQ 2-9:    ED from 03/26/2020 in [facility] Office  Visit from 05/07/2017 in [facility]  Thoughts that you would be better off dead, or of hurting yourself in some way Several days Not at all  PHQ-9 Total Score 19 0        [facility]) [facility] from 03/26/2020 in [facility] Admission (Discharged) [facility]  C-SSRS RISK CATEGORY No Risk No Risk No Risk       Total Time spent with patient: 30 minutes  Musculoskeletal  Strength & Muscle Tone: within normal limits Gait & Station: normal Patient leans: N/A  Psychiatric Specialty Exam  Presentation General Appearance: Casual  Eye Contact:Fair  Speech:Clear and Coherent;Normal Rate  Speech Volume:Normal  Handedness:Right   Mood and Affect  Mood:Depressed  Affect:Appropriate;Congruent   Thought Process  Thought Processes:Coherent;Goal Directed  Descriptions of Associations:Intact  Orientation:Full (Time, Place and Person)  Thought Content:Logical;WDL  Hallucinations:Hallucinations: None  Ideas of Reference:None  Suicidal Thoughts:Suicidal Thoughts: No  Homicidal Thoughts:Homicidal Thoughts: No   Sensorium  Memory:Immediate Fair;Recent Fair;Remote Fair  Judgment:Fair  Insight:Fair   Executive Functions  Concentration:Good  Attention Span:Good  Recall:Good  Fund of Knowledge:Good  Language:Good   Psychomotor Activity  Psychomotor Activity:Psychomotor Activity: Normal   Assets  Assets:Communication Skills;Desire for Improvement;Physical Health;Resilience;Social Support   Sleep  Sleep:Sleep: Fair   Physical Exam Vitals and nursing note reviewed.  Constitutional:  Appearance: She is normal weight.  HENT:     Nose: Nose normal.     Mouth/Throat:     Mouth: Mucous membranes are moist.  Cardiovascular:     Rate and Rhythm: Normal rate.  Pulmonary:      Effort: Pulmonary effort is normal.  Musculoskeletal:        General: Normal range of motion.     Cervical back: Normal range of motion and neck supple.  Neurological:     Mental Status: She is alert and oriented to person, place, and time.  Psychiatric:        Attention and Perception: She is inattentive. She perceives auditory hallucinations.        Mood and Affect: Mood is anxious and depressed. Affect is angry.        Speech: Speech is rapid and pressured and tangential.        Behavior: Behavior is uncooperative, agitated, aggressive and combative.        Thought Content: Thought content is paranoid and delusional.        Cognition and Memory: Cognition is impaired. Memory is impaired. She exhibits impaired recent memory and impaired remote memory.        Judgment: Judgment is impulsive.    Review of Systems  Psychiatric/Behavioral: Positive for depression, hallucinations and substance abuse. The patient is nervous/anxious.   All other systems reviewed and are negative.   Blood pressure (!) 122/91, pulse 99, temperature 97.7 F (36.5 C), temperature source Tympanic, resp. rate 18, SpO2 100 %. There is no height or weight on file to calculate BMI.  Past Psychiatric History:    Is the patient at risk to self? Yes  Has the patient been a risk to self in the past 6 months? Yes .    Has the patient been a risk to self within the distant past? Yes   Is the patient a risk to others? No   Has the patient been a risk to others in the past 6 months? No   Has the patient been a risk to others within the distant past? No   Past Medical History:  Past Medical History:  Diagnosis Date  . Anemia   . Drug abuse (HCC)   . Psychosis (HCC) 2013    Past Surgical History:  Procedure Laterality Date  . CESAREAN SECTION  03/2016   pLTCS for twin B at [facility]  . LAPAROSCOPY N/A 04/14/2017   Procedure: LAPAROSCOPY OPERATIVE WITH RIGHT SALPINGECTOMY;  Surgeon: Conan Bowens, MD;   Location: [facility];  Service: Gynecology;  Laterality: N/A;  . TUBAL LIGATION Bilateral 01/22/2018   Procedure: POST PARTUM TUBAL LIGATION;  Surgeon: [name], DO;  Location: [facility];  Service: Gynecology;  Laterality: Bilateral;    Family History:  Family History  Problem Relation Age of Onset  . Diabetes Mother   . Schizophrenia Mother   . Diabetes Brother   . Hypertension Maternal Aunt   . Healthy Father     Social History:  Social History   Socioeconomic History  . Marital status: Single    Spouse name: Not on file  . Number of children: Not on file  . Years of education: Not on file  . Highest education level: Not on file  Occupational History  . Not on file  Tobacco Use  . Smoking status: Current Every Day Smoker    Packs/day: 0.25    Types: Cigarettes  . Smokeless tobacco: Never Used  Vaping Use  . Vaping Use: Never used  Substance and Sexual Activity  . Alcohol use: Not Currently    Alcohol/week: 0.0 standard drinks    Comment: socially  . Drug use: Not Currently    Types: Marijuana, Cocaine    Comment: Cocaine & Marijuana was used10/26/2018  . Sexual activity: Yes    Birth control/protection: None  Other Topics Concern  . Not on file  Social History Narrative  . Not on file   Social Determinants of Health   Financial Resource Strain:   . Difficulty of Paying Living Expenses: Not on file  Food Insecurity:   . Worried About Programme researcher, broadcasting/film/video in the Last Year: Not on file  . Ran Out of Food in the Last Year: Not on file  Transportation Needs:   . Lack of Transportation (Medical): Not on file  . Lack of Transportation (Non-Medical): Not on file  Physical Activity:   . Days of Exercise per Week: Not on file  . Minutes of Exercise per Session: Not on file  Stress:   . Feeling of Stress : Not on file  Social Connections:   . Frequency of Communication with Friends and Family: Not on file  . Frequency of Social Gatherings with Friends  and Family: Not on file  . Attends Religious Services: Not on file  . Active Member of Clubs or Organizations: Not on file  . Attends Banker Meetings: Not on file  . Marital Status: Not on file  Intimate Partner Violence:   . Fear of Current or Ex-Partner: Not on file  . Emotionally Abused: Not on file  . Physically Abused: Not on file  . Sexually Abused: Not on file    SDOH:  SDOH Screenings   Alcohol Screen: Low Risk   . Last Alcohol Screening Score (AUDIT): 0  Depression (PHQ2-9): Medium Risk  . PHQ-2 Score: 19  Financial Resource Strain:   . Difficulty of Paying Living Expenses: Not on file  Food Insecurity:   . Worried About Programme researcher, broadcasting/film/video in the Last Year: Not on file  . Ran Out of Food in the Last Year: Not on file  Housing:   . Last Housing Risk Score: Not on file  Physical Activity:   . Days of Exercise per Week: Not on file  . Minutes of Exercise per Session: Not on file  Social Connections:   . Frequency of Communication with Friends and Family: Not on file  . Frequency of Social Gatherings with Friends and Family: Not on file  . Attends Religious Services: Not on file  . Active Member of Clubs or Organizations: Not on file  . Attends Banker Meetings: Not on file  . Marital Status: Not on file  Stress:   . Feeling of Stress : Not on file  Tobacco Use: High Risk  . Smoking Tobacco Use: Current Every Day Smoker  . Smokeless Tobacco Use: Never Used  Transportation Needs:   . Freight forwarder (Medical): Not on file  . Lack of Transportation (Non-Medical): Not on file    Last Labs:  Admission on 05/09/2020  Component Date Value Ref Range Status  . SARS Coronavirus 2 Ag 05/09/2020 Negative  Negative Preliminary  . SARS Coronavirus 2 by RT PCR 05/09/2020 NEGATIVE  NEGATIVE Final   Comment: (NOTE) SARS-CoV-2 target nucleic acids are NOT DETECTED.  The SARS-CoV-2 RNA is generally detectable in upper respiratoy specimens  during the acute phase of infection. The  lowest concentration of SARS-CoV-2 viral copies this assay can detect is 131 copies/mL. A negative result does not preclude SARS-Cov-2 infection and should not be used as the sole basis for treatment or other patient management decisions. A negative result may occur with  improper specimen collection/handling, submission of specimen other than nasopharyngeal swab, presence of viral mutation(s) within the areas targeted by this assay, and inadequate number of viral copies (<131 copies/mL). A negative result must be combined with clinical observations, patient history, and epidemiological information. The expected result is Negative.  Fact Sheet for Patients:  https://www.moore.com/  Fact Sheet for Healthcare Providers:  https://www.young.biz/  This test is no                          t yet approved or cleared by the [place] FDA and  has been authorized for detection and/or diagnosis of SARS-CoV-2 by FDA under an Emergency Use Authorization (EUA). This EUA will remain  in effect (meaning this test can be used) for the duration of the COVID-19 declaration under Section 564(b)(1) of the Act, 21 U.S.C. section 360bbb-3(b)(1), unless the authorization is terminated or revoked sooner.    . Influenza A by PCR 05/09/2020 NEGATIVE  NEGATIVE Final  . Influenza B by PCR 05/09/2020 NEGATIVE  NEGATIVE Final   Comment: (NOTE) The Xpert Xpress SARS-CoV-2/FLU/RSV assay is intended as an aid in  the diagnosis of influenza from Nasopharyngeal swab specimens and  should not be used as a sole basis for treatment. Nasal washings and  aspirates are unacceptable for Xpert Xpress SARS-CoV-2/FLU/RSV  testing.  Fact Sheet for Patients: https://www.moore.com/  Fact Sheet for Healthcare Providers: https://www.young.biz/  This test is not yet approved or cleared by the [place]  FDA and  has been authorized for detection and/or diagnosis of SARS-CoV-2 by  FDA under an Emergency Use Authorization (EUA). This EUA will remain  in effect (meaning this test can be used) for the duration of the  Covid-19 declaration under Section 564(b)(1) of the Act, 21  U.S.C. section 360bbb-3(b)(1), unless the authorization is  terminated or revoked. Performed at [facility], [address], [place], [place] [address]   Admission on 05/08/2020, Discharged on 05/08/2020  Component Date Value Ref Range Status  . SARS Coronavirus 2 Ag 05/08/2020 Negative  Negative Preliminary  . WBC 05/08/2020 10.6* 4.0 - 10.5 K/uL Final  . RBC 05/08/2020 5.02  3.87 - 5.11 MIL/uL Final  . Hemoglobin 05/08/2020 11.8* 12.0 - 15.0 g/dL Final  . HCT 93/81/0175 38.3  36 - 46 % Final  . MCV 05/08/2020 76.3* 80.0 - 100.0 fL Final  . MCH 05/08/2020 23.5* 26.0 - 34.0 pg Final  . MCHC 05/08/2020 30.8  30.0 - 36.0 g/dL Final  . RDW 04/12/8526 14.8  11.5 - 15.5 % Final  . Platelets 05/08/2020 432* 150 - 400 K/uL Final  . nRBC 05/08/2020 0.0  0.0 - 0.2 % Final  . Neutrophils Relative % 05/08/2020 74  % Final  . Neutro Abs 05/08/2020 7.8* 1.7 - 7.7 K/uL Final  . Lymphocytes Relative 05/08/2020 18  % Final  . Lymphs Abs 05/08/2020 1.9  0.7 - 4.0 K/uL Final  . Monocytes Relative 05/08/2020 4  % Final  . Monocytes Absolute 05/08/2020 0.5  0.1 - 1.0 K/uL Final  . Eosinophils Relative 05/08/2020 3  % Final  . Eosinophils Absolute 05/08/2020 0.3  0.0 - 0.5 K/uL Final  . Basophils Relative 05/08/2020 1  %  Final  . Basophils Absolute 05/08/2020 0.1  0.0 - 0.1 K/uL Final  . Immature Granulocytes 05/08/2020 0  % Final  . Abs Immature Granulocytes 05/08/2020 0.04  0.00 - 0.07 K/uL Final   Performed at [facility], [address], [place], [place] [address]  . Sodium 05/08/2020 139  135 - 145 mmol/L Final  . Potassium 05/08/2020 3.4* 3.5 - 5.1 mmol/L Final  . Chloride 05/08/2020 104  98 - 111 mmol/L  Final  . CO2 05/08/2020 25  22 - 32 mmol/L Final  . Glucose, Bld 05/08/2020 87  70 - 99 mg/dL Final   Glucose reference range applies only to samples taken after fasting for at least 8 hours.  . BUN 05/08/2020 9  6 - 20 mg/dL Final  . Creatinine, Ser 05/08/2020 0.91  0.44 - 1.00 mg/dL Final  . Calcium 34/74/2595 9.7  8.9 - 10.3 mg/dL Final  . Total Protein 05/08/2020 7.8  6.5 - 8.1 g/dL Final  . Albumin 63/87/5643 4.3  3.5 - 5.0 g/dL Final  . AST 32/95/1884 20  15 - 41 U/L Final  . ALT 05/08/2020 20  0 - 44 U/L Final  . Alkaline Phosphatase 05/08/2020 39  38 - 126 U/L Final  . Total Bilirubin 05/08/2020 1.1  0.3 - 1.2 mg/dL Final  . GFR, Estimated 05/08/2020 >60  >60 mL/min Final   Comment: (NOTE) Calculated using the CKD-EPI Creatinine Equation (2021)   . Anion gap 05/08/2020 10  5 - 15 Final   Performed at [facility], [address] [place], [place] [address]  . Hgb A1c MFr Bld 05/08/2020 5.1  4.8 - 5.6 % Final   Comment: (NOTE) Pre diabetes:          5.7%-6.4%  Diabetes:              >6.4%  Glycemic control for   <7.0% adults with diabetes   . Mean Plasma Glucose 05/08/2020 99.67  mg/dL Final   Performed at [facility], [address] [place], [place] [address]  . Magnesium 05/08/2020 2.2  1.7 - 2.4 mg/dL Final   Performed at [facility], [address], [place], [place] [address]  . Alcohol, Ethyl (B) 05/08/2020 <10  <10 mg/dL Final   Comment: (NOTE) Lowest detectable limit for serum alcohol is 10 mg/dL.  For medical purposes only. Performed at [facility], [address] [place] [address] [place], [place] [address]   . Cholesterol 05/08/2020 177  0 - 200 mg/dL Final  . Triglycerides 05/08/2020 70  <150 mg/dL Final  . HDL 22/07/5425 69  >40 mg/dL Final  . Total CHOL/HDL Ratio 05/08/2020 2.6  RATIO Final  . VLDL 05/08/2020 14  0 - 40 mg/dL Final  . LDL Cholesterol 05/08/2020 94  0 - 99 mg/dL Final   Comment:        Total Cholesterol/HDL:CHD  Risk Coronary Heart Disease Risk Table                     Men   Women  1/2 Average Risk   3.4   3.3  Average Risk       5.0   4.4  2 X Average Risk   9.6   7.1  3 X Average Risk  23.4   11.0        Use the calculated Patient Ratio above and the CHD Risk Table to determine the patient's CHD Risk.  ATP III CLASSIFICATION (LDL):  <100     mg/dL   Optimal  154-008  mg/dL   Near or Above                    Optimal  130-159  mg/dL   Borderline  676-195  mg/dL   High  >093     mg/dL   Very High Performed at [facility], [address], [place], [place] [address]   . TSH 05/08/2020 2.540  0.350 - 4.500 uIU/mL Final   Comment: Performed by a 3rd Generation assay with a functional sensitivity of <=0.01 uIU/mL. Performed at [facility], [address], [place], [place] [address]   . Prolactin 05/08/2020 5.3  4.8 - 23.3 ng/mL Final   Comment: (NOTE) Performed At: [facility] [address] [place], [place] [id number] Jolene Schimke MD LZ:[id number]   . POC Amphetamine UR 05/08/2020 None Detected  None Detected Final  . POC Secobarbital (BAR) 05/08/2020 None Detected  None Detected Final  . POC Buprenorphine (BUP) 05/08/2020 None Detected  None Detected Final  . POC Oxazepam (BZO) 05/08/2020 None Detected  None Detected Final  . POC Cocaine UR 05/08/2020 Positive* None Detected Final  . POC Methamphetamine UR 05/08/2020 None Detected  None Detected Final  . POC Morphine 05/08/2020 None Detected  None Detected Final  . POC Oxycodone UR 05/08/2020 None Detected  None Detected Final  . POC Methadone UR 05/08/2020 None Detected  None Detected Final  . POC Marijuana UR 05/08/2020 None Detected  None Detected Final  . Glucose-Capillary 05/08/2020 93  70 - 99 mg/dL Final   Glucose reference range applies only to samples taken after fasting for at least 8 hours.  . Preg Test, Ur 05/08/2020 NEGATIVE  NEGATIVE Final   Comment:        THE SENSITIVITY OF  THIS METHODOLOGY IS >24 mIU/mL   Admission on 03/27/2020, Discharged on 03/29/2020  Component Date Value Ref Range Status  . Opiates 03/28/2020 NONE DETECTED  NONE DETECTED Final  . Cocaine 03/28/2020 POSITIVE* NONE DETECTED Final  . Benzodiazepines 03/28/2020 NONE DETECTED  NONE DETECTED Final  . Amphetamines 03/28/2020 NONE DETECTED  NONE DETECTED Final  . Tetrahydrocannabinol 03/28/2020 POSITIVE* NONE DETECTED Final  . Barbiturates 03/28/2020 NONE DETECTED  NONE DETECTED Final   Comment: (NOTE) DRUG SCREEN FOR MEDICAL PURPOSES ONLY.  IF CONFIRMATION IS NEEDED FOR ANY PURPOSE, NOTIFY LAB WITHIN 5 DAYS.  LOWEST DETECTABLE LIMITS FOR URINE DRUG SCREEN Drug Class                     Cutoff (ng/mL) Amphetamine and metabolites    1000 Barbiturate and metabolites    200 Benzodiazepine                 200 Tricyclics and metabolites     300 Opiates and metabolites        300 Cocaine and metabolites        300 THC                            50 Performed at [facility], [address], [place], [place] [address]   Admission on 03/26/2020, Discharged on 03/27/2020  Component Date Value Ref Range Status  . SARS Coronavirus 2 by RT PCR 03/26/2020 NEGATIVE  NEGATIVE Final   Comment: (NOTE) SARS-CoV-2 target nucleic acids are NOT DETECTED.  The SARS-CoV-2  RNA is generally detectable in upper respiratoy specimens during the acute phase of infection. The lowest concentration of SARS-CoV-2 viral copies this assay can detect is 131 copies/mL. A negative result does not preclude SARS-Cov-2 infection and should not be used as the sole basis for treatment or other patient management decisions. A negative result may occur with  improper specimen collection/handling, submission of specimen other than nasopharyngeal swab, presence of viral mutation(s) within the areas targeted by this assay, and inadequate number of viral copies (<131 copies/mL). A negative result must be  combined with clinical observations, patient history, and epidemiological information. The expected result is Negative.  Fact Sheet for Patients:  https://www.moore.com/  Fact Sheet for Healthcare Providers:  https://www.young.biz/  This test is no                          t yet approved or cleared by the [place] FDA and  has been authorized for detection and/or diagnosis of SARS-CoV-2 by FDA under an Emergency Use Authorization (EUA). This EUA will remain  in effect (meaning this test can be used) for the duration of the COVID-19 declaration under Section 564(b)(1) of the Act, 21 U.S.C. section 360bbb-3(b)(1), unless the authorization is terminated or revoked sooner.    . Influenza A by PCR 03/26/2020 NEGATIVE  NEGATIVE Final  . Influenza B by PCR 03/26/2020 NEGATIVE  NEGATIVE Final   Comment: (NOTE) The Xpert Xpress SARS-CoV-2/FLU/RSV assay is intended as an aid in  the diagnosis of influenza from Nasopharyngeal swab specimens and  should not be used as a sole basis for treatment. Nasal washings and  aspirates are unacceptable for Xpert Xpress SARS-CoV-2/FLU/RSV  testing.  Fact Sheet for Patients: https://www.moore.com/  Fact Sheet for Healthcare Providers: https://www.young.biz/  This test is not yet approved or cleared by the [place] FDA and  has been authorized for detection and/or diagnosis of SARS-CoV-2 by  FDA under an Emergency Use Authorization (EUA). This EUA will remain  in effect (meaning this test can be used) for the duration of the  Covid-19 declaration under Section 564(b)(1) of the Act, 21  U.S.C. section 360bbb-3(b)(1), unless the authorization is  terminated or revoked. Performed at [facility], [address] [place], [place] [address]   . SARS Coronavirus 2 Ag 03/26/2020 Negative  Negative Preliminary  . WBC 03/26/2020 7.4  4.0 - 10.5 K/uL Final  . RBC  03/26/2020 4.54  3.87 - 5.11 MIL/uL Final  . Hemoglobin 03/26/2020 10.7* 12.0 - 15.0 g/dL Final  . HCT 41/32/4401 34.2* 36 - 46 % Final  . MCV 03/26/2020 75.3* 80.0 - 100.0 fL Final  . MCH 03/26/2020 23.6* 26.0 - 34.0 pg Final  . MCHC 03/26/2020 31.3  30.0 - 36.0 g/dL Final  . RDW 02/72/5366 15.2  11.5 - 15.5 % Final  . Platelets 03/26/2020 313  150 - 400 K/uL Final  . nRBC 03/26/2020 0.0  0.0 - 0.2 % Final  . Neutrophils Relative % 03/26/2020 44  % Final  . Neutro Abs 03/26/2020 3.3  1.7 - 7.7 K/uL Final  . Lymphocytes Relative 03/26/2020 43  % Final  . Lymphs Abs 03/26/2020 3.2  0.7 - 4.0 K/uL Final  . Monocytes Relative 03/26/2020 8  % Final  . Monocytes Absolute 03/26/2020 0.6  0.1 - 1.0 K/uL Final  . Eosinophils Relative 03/26/2020 4  % Final  . Eosinophils Absolute 03/26/2020 0.3  0.0 - 0.5 K/uL Final  . Basophils  Relative 03/26/2020 1  % Final  . Basophils Absolute 03/26/2020 0.0  0.0 - 0.1 K/uL Final  . Immature Granulocytes 03/26/2020 0  % Final  . Abs Immature Granulocytes 03/26/2020 0.02  0.00 - 0.07 K/uL Final   Performed at [facility], [address], [place], [place] [address]  . Sodium 03/26/2020 138  135 - 145 mmol/L Final  . Potassium 03/26/2020 3.2* 3.5 - 5.1 mmol/L Final  . Chloride 03/26/2020 106  98 - 111 mmol/L Final  . CO2 03/26/2020 22  22 - 32 mmol/L Final  . Glucose, Bld 03/26/2020 84  70 - 99 mg/dL Final   Glucose reference range applies only to samples taken after fasting for at least 8 hours.  . BUN 03/26/2020 8  6 - 20 mg/dL Final  . Creatinine, Ser 03/26/2020 0.87  0.44 - 1.00 mg/dL Final  . Calcium 27/25/3664 8.9  8.9 - 10.3 mg/dL Final  . Total Protein 03/26/2020 7.0  6.5 - 8.1 g/dL Final  . Albumin 40/34/7425 3.9  3.5 - 5.0 g/dL Final  . AST 95/63/8756 16  15 - 41 U/L Final  . ALT 03/26/2020 14  0 - 44 U/L Final  . Alkaline Phosphatase 03/26/2020 38  38 - 126 U/L Final  . Total Bilirubin 03/26/2020 1.1  0.3 - 1.2 mg/dL Final  . GFR,  Estimated 03/26/2020 >60  >60 mL/min Final  . Anion gap 03/26/2020 10  5 - 15 Final   Performed at [facility], [address] [place], [place] [address]  . Hgb A1c MFr Bld 03/26/2020 4.9  4.8 - 5.6 % Final   Comment: (NOTE) Pre diabetes:          5.7%-6.4%  Diabetes:              >6.4%  Glycemic control for   <7.0% adults with diabetes   . Mean Plasma Glucose 03/26/2020 93.93  mg/dL Final   Performed at [facility], [address] [place], [place] [address]  . Cholesterol 03/26/2020 172  0 - 200 mg/dL Final  . Triglycerides 03/26/2020 55  <150 mg/dL Final  . HDL 16/60/6301 67  >40 mg/dL Final  . Total CHOL/HDL Ratio 03/26/2020 2.6  RATIO Final  . VLDL 03/26/2020 11  0 - 40 mg/dL Final  . LDL Cholesterol 03/26/2020 94  0 - 99 mg/dL Final   Comment:        Total Cholesterol/HDL:CHD Risk Coronary Heart Disease Risk Table                     Men   Women  1/2 Average Risk   3.4   3.3  Average Risk       5.0   4.4  2 X Average Risk   9.6   7.1  3 X Average Risk  23.4   11.0        Use the calculated Patient Ratio above and the CHD Risk Table to determine the patient's CHD Risk.        ATP III CLASSIFICATION (LDL):  <100     mg/dL   Optimal  601-093  mg/dL   Near or Above                    Optimal  130-159  mg/dL   Borderline  235-573  mg/dL   High  >220     mg/dL   Very High Performed at [facility], [address], [place], [place] [address]   . TSH 03/26/2020 1.998  0.350 - 4.500 uIU/mL Final   Comment: Performed by a 3rd Generation assay with a functional sensitivity of <=0.01 uIU/mL. Performed at [facility], [address] [place], [place] [address]   . SARS Coronavirus 2 Ag 03/26/2020 NEGATIVE  NEGATIVE Final   Comment: (NOTE) SARS-CoV-2 antigen NOT DETECTED.   Negative results are presumptive.  Negative results do not preclude SARS-CoV-2 infection and should not be used as the sole basis for treatment or other patient management  decisions, including infection  control decisions, particularly in the presence of clinical signs and  symptoms consistent with COVID-19, or in those who have been in contact with the virus.  Negative results must be combined with clinical observations, patient history, and epidemiological information. The expected result is Negative.  Fact Sheet for Patients: https://sanders-williams.net/  Fact Sheet for Healthcare Providers: https://martinez.com/   This test is not yet approved or cleared by the [place] FDA and  has been authorized for detection and/or diagnosis of SARS-CoV-2 by FDA under an Emergency Use Authorization (EUA).  This EUA will remain in effect (meaning this test can be used) for the duration of  the C                          OVID-19 declaration under Section 564(b)(1) of the Act, 21 U.S.C. section 360bbb-3(b)(1), unless the authorization is terminated or revoked sooner.    . Preg Test, Ur 03/27/2020 NEGATIVE  NEGATIVE Final   Comment:        THE SENSITIVITY OF THIS METHODOLOGY IS >24 mIU/mL   . Glucose, UA 03/27/2020 NEGATIVE  NEGATIVE mg/dL Final  . Bilirubin Urine 03/27/2020 SMALL* NEGATIVE Final  . Ketones, ur 03/27/2020 15* NEGATIVE mg/dL Final  . Specific Gravity, Urine 03/27/2020 >=1.030  1.005 - 1.030 Final  . Hgb urine dipstick 03/27/2020 LARGE* NEGATIVE Final  . pH 03/27/2020 6.5  5.0 - 8.0 Final  . Protein, ur 03/27/2020 100* NEGATIVE mg/dL Final  . Urobilinogen, UA 03/27/2020 2.0* 0.0 - 1.0 mg/dL Final  . Nitrite 09/81/1914 NEGATIVE  NEGATIVE Final  . Glori Luis 03/27/2020 NEGATIVE  NEGATIVE Final   Biochemical Testing Only. Please order routine urinalysis from main lab if confirmatory testing is needed.  Admission on 03/26/2020, Discharged on 03/26/2020  Component Date Value Ref Range Status  . Preg Test, Ur 03/26/2020 NEGATIVE  NEGATIVE Final   Comment:        THE SENSITIVITY OF THIS METHODOLOGY IS >24  mIU/mL   Admission on 01/23/2020, Discharged on 01/23/2020  Component Date Value Ref Range Status  . Color, UA 01/23/2020 yellow  yellow Final  . Clarity, UA 01/23/2020 clear  clear Final  . Glucose, UA 01/23/2020 negative  negative mg/dL Final  . Bilirubin, UA 01/23/2020 negative  negative Final  . Ketones, POC UA 01/23/2020 negative  negative mg/dL Final  . Spec Grav, UA 01/23/2020 >=1.030* 1.010 - 1.025 Final  . Blood, UA 01/23/2020 negative  negative Final  . pH, UA 01/23/2020 6.5  5.0 - 8.0 Final  . Protein Ur, POC 01/23/2020 negative  negative mg/dL Final  . Urobilinogen, UA 01/23/2020 1.0  0.2 or 1.0 E.U./dL Final  . Nitrite, UA 78/29/5621 Negative  Negative Final  . Leukocytes, UA 01/23/2020 Negative  Negative Final  . Preg Test, Ur 01/23/2020 Negative  Negative Final  . Trichomonas 01/23/2020 Negative   Final  .  Chlamydia 01/23/2020 Negative   Final  . Neisseria Gonorrhea 01/23/2020 Negative   Final  . Comment 01/23/2020 Normal Reference Ranger Chlamydia - Negative   Final  . Comment 01/23/2020 Normal Reference Range Trichomonas - Negative   Final  . Comment 01/23/2020 Normal Reference Range Neisseria Gonorrhea - Negative   Final  Admission on 01/19/2020, Discharged on 01/19/2020  Component Date Value Ref Range Status  . Lipase 01/19/2020 30  11 - 51 U/L Final   Performed at [facility], [address] [place], [place] [address]  . Sodium 01/19/2020 137  135 - 145 mmol/L Final  . Potassium 01/19/2020 3.5  3.5 - 5.1 mmol/L Final  . Chloride 01/19/2020 108  98 - 111 mmol/L Final  . CO2 01/19/2020 22  22 - 32 mmol/L Final  . Glucose, Bld 01/19/2020 92  70 - 99 mg/dL Final   Glucose reference range applies only to samples taken after fasting for at least 8 hours.  . BUN 01/19/2020 8  6 - 20 mg/dL Final  . Creatinine, Ser 01/19/2020 0.78  0.44 - 1.00 mg/dL Final  . Calcium 14/78/295608/07/2019 9.1  8.9 - 10.3 mg/dL Final  . Total Protein 01/19/2020 7.2  6.5 - 8.1 g/dL Final  .  Albumin 21/30/865708/07/2019 3.7  3.5 - 5.0 g/dL Final  . AST 84/69/629508/07/2019 18  15 - 41 U/L Final  . ALT 01/19/2020 19  0 - 44 U/L Final  . Alkaline Phosphatase 01/19/2020 38  38 - 126 U/L Final  . Total Bilirubin 01/19/2020 1.2  0.3 - 1.2 mg/dL Final  . GFR calc non Af Amer 01/19/2020 >60  >60 mL/min Final  . GFR calc Af Amer 01/19/2020 >60  >60 mL/min Final  . Anion gap 01/19/2020 7  5 - 15 Final   Performed at [facility], [address] [place], [place] [address]  . WBC 01/19/2020 10.2  4.0 - 10.5 K/uL Final  . RBC 01/19/2020 4.70  3.87 - 5.11 MIL/uL Final  . Hemoglobin 01/19/2020 10.8* 12.0 - 15.0 g/dL Final  . HCT 24/40/102708/07/2019 34.9* 36 - 46 % Final  . MCV 01/19/2020 74.3* 80.0 - 100.0 fL Final  . MCH 01/19/2020 23.0* 26.0 - 34.0 pg Final  . MCHC 01/19/2020 30.9  30.0 - 36.0 g/dL Final  . RDW 25/36/644008/07/2019 15.4  11.5 - 15.5 % Final  . Platelets 01/19/2020 419* 150 - 400 K/uL Final  . nRBC 01/19/2020 0.0  0.0 - 0.2 % Final   Performed at [facility], [address] [place], [place] [address]  . Color, Urine 01/19/2020 YELLOW  YELLOW Final  . APPearance 01/19/2020 CLEAR  CLEAR Final  . Specific Gravity, Urine 01/19/2020 1.016  1.005 - 1.030 Final  . pH 01/19/2020 5.0  5.0 - 8.0 Final  . Glucose, UA 01/19/2020 NEGATIVE  NEGATIVE mg/dL Final  . Hgb urine dipstick 01/19/2020 NEGATIVE  NEGATIVE Final  . Bilirubin Urine 01/19/2020 NEGATIVE  NEGATIVE Final  . Ketones, ur 01/19/2020 5* NEGATIVE mg/dL Final  . Protein, ur 59/56/387508/07/2019 NEGATIVE  NEGATIVE mg/dL Final  . Nitrite 64/33/295108/07/2019 NEGATIVE  NEGATIVE Final  . Glori LuisLeukocytes,Ua 01/19/2020 NEGATIVE  NEGATIVE Final   Performed at [facility], [address] [place], [place] [address]  . I-stat hCG, quantitative 01/19/2020 <5.0  <5 mIU/mL Final  . Comment 3 01/19/2020          Final   Comment:   GEST. AGE      CONC.  (  mIU/mL)   <=1 WEEK        5 - 50     2 WEEKS       50 - 500     3 WEEKS       100 - 10,000     4 WEEKS     1,000 -  30,000        FEMALE AND NON-PREGNANT FEMALE:     LESS THAN 5 mIU/mL   Admission on 11/20/2019, Discharged on 11/20/2019  Component Date Value Ref Range Status  . Preg Test, Ur 11/20/2019 Negative  Negative Final  . Color, UA 11/20/2019 yellow  yellow Final  . Clarity, UA 11/20/2019 cloudy* clear Final  . Glucose, UA 11/20/2019 negative  negative mg/dL Final  . Bilirubin, UA 11/20/2019 negative  negative Final  . Ketones, POC UA 11/20/2019 negative  negative mg/dL Final  . Spec Grav, UA 11/20/2019 1.015  1.010 - 1.025 Final  . Blood, UA 11/20/2019 negative  negative Final  . pH, UA 11/20/2019 8.5* 5.0 - 8.0 Final  . Protein Ur, POC 11/20/2019 negative  negative mg/dL Final  . Urobilinogen, UA 11/20/2019 0.2  0.2 or 1.0 E.U./dL Final  . Nitrite, UA 16/03/9603 Negative  Negative Final  . Leukocytes, UA 11/20/2019 Large (3+)* Negative Final  . Neisseria Gonorrhea 11/20/2019 Positive*  Final  . Chlamydia 11/20/2019 Negative   Final  . Trichomonas 11/20/2019 Negative   Final  . Comment 11/20/2019 Normal Reference Range Trichomonas - Negative   Final  . Comment 11/20/2019 Normal Reference Ranger Chlamydia - Negative   Final  . Comment 11/20/2019 Normal Reference Range Neisseria Gonorrhea - Negative   Final  . Specimen Description 11/20/2019 URINE, RANDOM   Final  . Special Requests 11/20/2019 NONE   Final  . Culture 11/20/2019    Final                   Value:NO GROWTH Performed at [facility], [address] [place], [place] [address]   . Report Status 11/20/2019 11/22/2019 FINAL   Final    Allergies: Naproxen  PTA Medications: (Not in a hospital admission)   Medical Decision Making    Recommendations  Based on my evaluation the patient does not appear to have an emergency medical condition.  Gillermo Murdoch, NP 05/10/20  12:31 AM

## 2020-05-10 NOTE — ED Notes (Signed)
Patient A&O x 4, ambulatory. Patient discharged in no acute distress. Patient denied SI/HI, A/VH upon discharge. Patient verbalized understanding of all discharge instructions explained by staff, to include follow up appointments, RX's and safety plan. Pt belongings returned to patient from locker #5 intact. Patient escorted to lobby via staff for transport to destination. Safety maintained.

## 2020-05-14 ENCOUNTER — Telehealth (HOSPITAL_COMMUNITY): Payer: Self-pay | Admitting: General Practice

## 2020-05-14 NOTE — Telephone Encounter (Signed)
Care Management - Follow Up [facility] Discharges   Writer left a HIPPA compliant voice mail.    Writer left name and phone number for the patent to call back if further assistance is needed in scheduling a follow up appointment with an outpatient provider.     

## 2020-05-15 ENCOUNTER — Telehealth (HOSPITAL_COMMUNITY): Payer: Self-pay | Admitting: General Practice

## 2020-05-15 NOTE — Telephone Encounter (Signed)
Care Management - Follow Up [facility] Discharges   Writer left a HIPPA compliant voice mail.    Writer left name and phone number for the patent to call back if further assistance is needed in scheduling a follow up appointment with an outpatient provider.     

## 2020-05-19 ENCOUNTER — Emergency Department (HOSPITAL_COMMUNITY)
Admission: EM | Admit: 2020-05-19 | Discharge: 2020-05-21 | Disposition: A | Discharge status: Home / Self Care | Payer: Medicaid Other | Attending: Emergency Medicine | Admitting: Emergency Medicine

## 2020-05-19 DIAGNOSIS — F319 Bipolar disorder, unspecified: Secondary | ICD-10-CM | POA: Insufficient documentation

## 2020-05-19 DIAGNOSIS — Z20822 Contact with and (suspected) exposure to covid-19: Secondary | ICD-10-CM | POA: Insufficient documentation

## 2020-05-19 DIAGNOSIS — F29 Unspecified psychosis not due to a substance or known physiological condition: Secondary | ICD-10-CM | POA: Insufficient documentation

## 2020-05-19 DIAGNOSIS — R45851 Suicidal ideations: Secondary | ICD-10-CM | POA: Insufficient documentation

## 2020-05-19 DIAGNOSIS — F17210 Nicotine dependence, cigarettes, uncomplicated: Secondary | ICD-10-CM | POA: Insufficient documentation

## 2020-05-19 MED ORDER — LORAZEPAM 2 MG/ML IJ SOLN
1.0000 mg | Freq: Once | INTRAMUSCULAR | Status: AC
  Administered 2020-05-19: 1 mg via INTRAMUSCULAR
  Filled 2020-05-19: qty 1

## 2020-05-19 NOTE — ED Triage Notes (Signed)
Pt escorted by police. Pt was found in the middle of the street saying she wanted to end things. Bystanders called 911

## 2020-05-19 NOTE — ED Provider Notes (Signed)
[facility] EMERGENCY DEPARTMENT Provider Note   CSN: [id number] Arrival date & time: 05/19/20  2322     History Chief Complaint  Patient presents with  . Psychiatric Evaluation    Debra Sullivan is a 34 y.o. female.  HPI   34 year old female with a history of anemia, drug abuse, psychosis, bipolar 1 disorder, MDD, PTSD, cocaine use disorder, who presents to the emergency department today accompanied by [employer].  Patient here for psychiatric evaluation.  Per [employer], patient was found wandering outside in her underwear.  They state that she was talking gibberish and also making comments that she wanted to end things.  On my evaluation the patient is rolling around in the exam bed and is distracted during history taking.  Denies SI.  Denies drug use denies any current systemic symptoms.  Past Medical History:  Diagnosis Date  . Anemia   . Drug abuse (HCC)   . Psychosis (HCC) 2013    Patient Active Problem List   Diagnosis Date Noted  . Bipolar 1 disorder (HCC) 03/27/2020  . Bipolar I disorder, most recent episode (or current) manic (HCC) 06/14/2018  . VBAC, delivered, current hospitalization 01/22/2018  . Supervision of high-risk pregnancy 01/21/2018  . LGA (large for gestational age) fetus affecting management of mother, third trimester, fetus 1 01/08/2018  . Anemia during pregnancy in third trimester 11/28/2017  . Drug dependence affecting pregnancy in third trimester   . Suspected fetal anomaly, antepartum   . Trichomonosis 08/01/2017  . Not immune to rubella 08/01/2017  . Supervision of other normal pregnancy, antepartum 07/11/2017  . History of cesarean section complicating pregnancy 07/11/2017  . Unwanted fertility 07/11/2017  . 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 Cannabis use disorder, severe, dependence (HCC) 04/15/2015  . PTSD (post-traumatic stress disorder) 04/15/2015  . Cocaine use disorder,  moderate, in early remission (HCC) 04/15/2015  . Psychosis (HCC) 04/14/2015    Past Surgical History:  Procedure Laterality Date  . CESAREAN SECTION  03/2016   [facility] B [facility]  . LAPAROSCOPY N/A 04/14/2017   Procedure: LAPAROSCOPY OPERATIVE WITH RIGHT SALPINGECTOMY;  Surgeon: Conan Bowens, MD;  Location: [facility] ORS;  Service: Gynecology;  Laterality: N/A;  . TUBAL LIGATION Bilateral 01/22/2018   Procedure: POST PARTUM TUBAL LIGATION;  Surgeon: [name], DO;  Location: [facility] BIRTHING SU[facility];  Service: Gynecology;  Laterality: Bilateral;     OB History    Gravida  6   Para  5   Term  4   Preterm  1   AB  1   Living  6     SAB  0   TAB      Ectopic  1   Multiple  1   Live Births  6           Family History  Problem Relation Age of Onset  . Diabetes Mother   . Schizophrenia Mother   . Diabetes Brother   . Hypertension Maternal Aunt   . Healthy Father     Social History   Tobacco Use  . Smoking status: Current Every Day Smoker    Packs/day: 0.25    Types: Cigarettes  . Smokeless tobacco: Never Used  Vaping Use  . Vaping Use: Never used  Substance Use Topics  . Alcohol use: Not Currently    Alcohol/week: 0.0 standard drinks    Comment: socially  . Drug use: Not Currently  Types: Marijuana, Cocaine    Comment: Cocaine & Marijuana was used10/26/2018    Home Medications Prior to Admission medications   Medication Sig Start Date End Date Taking? Authorizing Provider  acetaminophen (TYLENOL) 325 MG tablet Take 650 mg by mouth every 6 (six) hours as needed for mild pain or headache.    [provider]  hydrOXYzine (ATARAX/VISTARIL) 25 MG tablet Take 1 tablet (25 mg total) by mouth 3 (three) times daily as needed for anxiety. 05/10/20   Money, Gerlene Burdock, FNP  risperiDONE (RISPERDAL M-TABS) 1 MG disintegrating tablet Take 1 tablet (1 mg total) by mouth at bedtime. 05/10/20   Money, Gerlene Burdock, FNP  traZODone (DESYREL) 50 MG tablet  Take 1 tablet (50 mg total) by mouth at bedtime as needed for sleep. 05/10/20   Money, Gerlene Burdock, FNP    Allergies    Naproxen  Review of Systems   Review of Systems  Unable to perform ROS: Psychiatric disorder  Constitutional: Negative for fever.  Respiratory: Negative for shortness of breath.   Cardiovascular: Negative for chest pain.  Gastrointestinal: Negative for abdominal pain.  Genitourinary: Negative for flank pain.  Psychiatric/Behavioral: Positive for suicidal ideas.    Physical Exam Updated Vital Signs BP 109/66 (BP Location: Right Arm)   Pulse 100   Temp 98 F (36.7 C) (Oral)   Ht 5\' 5"  (1.651 m)   Wt 59 kg   SpO2 98%   BMI 21.63 kg/m   Physical Exam Vitals and nursing note reviewed.  Constitutional:      General: She is not in acute distress.    Appearance: She is well-developed.     Comments: In handcuffs. Rolling around in exam bed.   HENT:     Head: Normocephalic and atraumatic.  Eyes:     Conjunctiva/sclera: Conjunctivae normal.  Cardiovascular:     Rate and Rhythm: Normal rate and regular rhythm.  Pulmonary:     Effort: Pulmonary effort is normal.     Breath sounds: Normal breath sounds.  Abdominal:     Palpations: Abdomen is soft.  Musculoskeletal:     Cervical back: Neck supple.  Skin:    General: Skin is warm and dry.  Neurological:     Mental Status: She is alert.     Comments: Mental Status:  Alert, oriented x4. Appears intoxicated. Keeps calling out and speaking to her grandfather who is not present. Clear speech. No facial droop.   Psychiatric:        Attention and Perception: She is inattentive.        Speech: Speech is tangential.        Behavior: Behavior is uncooperative and agitated.        Thought Content: Thought content does not include suicidal ideation. Thought content does not include suicidal plan.     ED Results / Procedures / Treatments   Labs (all labs ordered are listed, but only abnormal results are  displayed) Labs Reviewed  COMPREHENSIVE METABOLIC PANEL - Abnormal; Notable for the following components:      Result Value   Potassium 2.8 (*)    CO2 15 (*)    Glucose, Bld 149 (*)    Calcium 8.7 (*)    Alkaline Phosphatase 34 (*)    All other components within normal limits  ETHANOL - Abnormal; Notable for the following components:   Alcohol, Ethyl (B) 101 (*)    All other components within normal limits  RAPID URINE DRUG SCREEN, HOSP PERFORMED - Abnormal;  Notable for the following components:   Cocaine POSITIVE (*)    Amphetamines POSITIVE (*)    Tetrahydrocannabinol POSITIVE (*)    All other components within normal limits  CBC WITH DIFFERENTIAL/PLATELET - Abnormal; Notable for the following components:   Hemoglobin 10.4 (*)    HCT 33.8 (*)    MCV 78.4 (*)    MCH 24.1 (*)    All other components within normal limits  SALICYLATE LEVEL - Abnormal; Notable for the following components:   Salicylate Lvl <7.0 (*)    All other components within normal limits  ACETAMINOPHEN LEVEL - Abnormal; Notable for the following components:   Acetaminophen (Tylenol), Serum <10 (*)    All other components within normal limits  MAGNESIUM - Abnormal; Notable for the following components:   Magnesium 1.6 (*)    All other components within normal limits  RESP PANEL BY RT-PCR (FLU A&B, COVID) [id number]  I-STAT BETA HCG BLOOD, ED ([facility], [facility], [facility] ONLY)  I-STAT CHEM 8, ED    EKG EKG Interpretation  Date/Time:  Thursday May 20 2020 04:01:29 EST Ventricular Rate:  98 PR Interval:  114 QRS Duration: 100 QT Interval:  364 QTC Calculation: 464 R Axis:   87 Text Interpretation: Normal sinus rhythm Incomplete right bundle branch block Borderline ECG Otherwise no significant change Confirmed by Drema Pryardama, Pedro [phone]([id number]) on 05/20/2020 4:37:24 AM   Radiology No results found.  Procedures Procedures (including critical care time)  Medications Ordered in ED Medications  LORazepam (ATIVAN) injection  1 mg (has no administration in time range)  potassium chloride (KLOR-CON) packet 40 mEq (has no administration in time range)  acetaminophen (TYLENOL) tablet 650 mg (has no administration in time range)  hydrOXYzine (ATARAX/VISTARIL) tablet 25 mg (has no administration in time range)  risperiDONE (RISPERDAL M-TABS) disintegrating tablet 1 mg (has no administration in time range)  traZODone (DESYREL) tablet 50 mg (has no administration in time range)  LORazepam (ATIVAN) injection 1 mg (1 mg Intramuscular Given 05/19/20 2339)  potassium chloride (KLOR-CON) packet 40 mEq (40 mEq Oral Given 05/20/20 0238)  magnesium oxide (MAG-OX) tablet 400 mg (400 mg Oral Given 05/20/20 0239)    ED Course  I have reviewed the triage vital signs and the nursing notes.  Pertinent labs & imaging results that were available during my care of the patient were reviewed by me and considered in my medical decision making (see chart for details).    MDM Rules/Calculators/A&P                          34 y/o F presents to the ED with [employer] after being found outside in her underwear. She reported suicidal thoughts to [employer].   Reviewed/interpreted labs CBC with mild anemia, chronic CMP with hypokalemia, otherwise unremarkable ETOH elevated UDS positive for cocaine, methamphetamines, and thc Beta hcg neg - reviewed in minilab Salicylate neg Tylenol level neg  EKG Normal sinus rhythm Incomplete right bundle branch block Borderline ECG Otherwise no significant change  Pt was placed under IVC. She was given IM ativan. Pt was also given 40 kdur twice during her stay. Will start her on a supplement. She appears to be appropriate for psych eval.   The patient has been placed in psychiatric observation due to the need to provide a safe environment for the patient while obtaining psychiatric consultation and evaluation, as well as ongoing medical and medication management to treat the patient's condition.  The patient has been  placed under full IVC at this time.    Final Clinical Impression(s) / ED Diagnoses Final diagnoses:  Suicidal ideation  Psychosis, unspecified psychosis type [facility])    Rx / DC Orders ED Discharge Orders    None       Rayne Du 05/20/20 0618    Nira Conn, MD 05/20/20 0745

## 2020-05-20 LAB — COMPREHENSIVE METABOLIC PANEL
ALT: 20 U/L (ref 0–44)
AST: 23 U/L (ref 15–41)
Albumin: 3.8 g/dL (ref 3.5–5.0)
Alkaline Phosphatase: 34 U/L — ABNORMAL LOW (ref 38–126)
Anion gap: 15 (ref 5–15)
BUN: 8 mg/dL (ref 6–20)
CO2: 15 mmol/L — ABNORMAL LOW (ref 22–32)
Calcium: 8.7 mg/dL — ABNORMAL LOW (ref 8.9–10.3)
Chloride: 107 mmol/L (ref 98–111)
Creatinine, Ser: 0.82 mg/dL (ref 0.44–1.00)
GFR, Estimated: >60 mL/min (ref >60)
Glucose, Bld: 149 mg/dL — ABNORMAL HIGH (ref 70–99)
Potassium: 2.8 mmol/L — ABNORMAL LOW (ref 3.5–5.1)
Sodium: 137 mmol/L (ref 135–145)
Total Bilirubin: 0.7 mg/dL (ref 0.3–1.2)
Total Protein: 6.9 g/dL (ref 6.5–8.1)

## 2020-05-20 LAB — RAPID URINE DRUG SCREEN, HOSP PERFORMED
Amphetamines: POSITIVE — AB
Barbiturates: NOT DETECTED
Benzodiazepines: NOT DETECTED
Cocaine: POSITIVE — AB
Opiates: NOT DETECTED
Tetrahydrocannabinol: POSITIVE — AB

## 2020-05-20 LAB — I-STAT CHEM 8, ED
BUN: 10 mg/dL (ref 6–20)
Calcium, Ion: 1.1 mmol/L — ABNORMAL LOW (ref 1.15–1.40)
Chloride: 107 mmol/L (ref 98–111)
Creatinine, Ser: 0.9 mg/dL (ref 0.44–1.00)
Glucose, Bld: 141 mg/dL — ABNORMAL HIGH (ref 70–99)
HCT: 36 % (ref 36.0–46.0)
Hemoglobin: 12.2 g/dL (ref 12.0–15.0)
Potassium: 2.8 mmol/L — ABNORMAL LOW (ref 3.5–5.1)
Sodium: 139 mmol/L (ref 135–145)
TCO2: 17 mmol/L — ABNORMAL LOW (ref 22–32)

## 2020-05-20 LAB — RESP PANEL BY RT-PCR (FLU A&B, COVID) ARPGX2
Influenza A by PCR: NEGATIVE
Influenza B by PCR: NEGATIVE
SARS Coronavirus 2 by RT PCR: NEGATIVE

## 2020-05-20 LAB — CBC WITH DIFFERENTIAL/PLATELET
Abs Immature Granulocytes: 0.03 10*3/uL (ref 0.00–0.07)
Basophils Absolute: 0 10*3/uL (ref 0.0–0.1)
Basophils Relative: 0 %
Eosinophils Absolute: 0 10*3/uL (ref 0.0–0.5)
Eosinophils Relative: 0 %
HCT: 33.8 % — ABNORMAL LOW (ref 36.0–46.0)
Hemoglobin: 10.4 g/dL — ABNORMAL LOW (ref 12.0–15.0)
Immature Granulocytes: 0 %
Lymphocytes Relative: 15 %
Lymphs Abs: 1.4 10*3/uL (ref 0.7–4.0)
MCH: 24.1 pg — ABNORMAL LOW (ref 26.0–34.0)
MCHC: 30.8 g/dL (ref 30.0–36.0)
MCV: 78.4 fL — ABNORMAL LOW (ref 80.0–100.0)
Monocytes Absolute: 0.6 10*3/uL (ref 0.1–1.0)
Monocytes Relative: 7 %
Neutro Abs: 6.9 10*3/uL (ref 1.7–7.7)
Neutrophils Relative %: 78 %
Platelets: 304 10*3/uL (ref 150–400)
RBC: 4.31 MIL/uL (ref 3.87–5.11)
RDW: 15.2 % (ref 11.5–15.5)
WBC: 9 10*3/uL (ref 4.0–10.5)
nRBC: 0 % (ref 0.0–0.2)

## 2020-05-20 LAB — ETHANOL: Alcohol, Ethyl (B): 101 mg/dL — ABNORMAL HIGH (ref <10)

## 2020-05-20 LAB — SALICYLATE LEVEL: Salicylate Lvl: <7 mg/dL — ABNORMAL LOW (ref 7.0–30.0)

## 2020-05-20 LAB — MAGNESIUM: Magnesium: 1.6 mg/dL — ABNORMAL LOW (ref 1.7–2.4)

## 2020-05-20 LAB — ACETAMINOPHEN LEVEL: Acetaminophen (Tylenol), Serum: <10 ug/mL — ABNORMAL LOW (ref 10–30)

## 2020-05-20 LAB — I-STAT BETA HCG BLOOD, ED (MC, WL, AP ONLY): I-stat hCG, quantitative: <5 m[IU]/mL (ref <5)

## 2020-05-20 MED ORDER — LORAZEPAM 2 MG/ML IJ SOLN: 1.0000 mg | Freq: Once | INTRAMUSCULAR | Status: DC

## 2020-05-20 MED ORDER — LORAZEPAM 1 MG PO TABS
1.0000 mg | ORAL_TABLET | Freq: Once | ORAL | Status: AC
  Administered 2020-05-20: 1 mg via ORAL
  Filled 2020-05-20: qty 1

## 2020-05-20 MED ORDER — ACETAMINOPHEN 325 MG PO TABS
650.0000 mg | ORAL_TABLET | Freq: Four times a day (QID) | ORAL | Status: DC | PRN
  Administered 2020-05-20: 650 mg via ORAL
  Filled 2020-05-20: qty 2

## 2020-05-20 MED ORDER — RISPERIDONE 1 MG PO TBDP: 1.0000 mg | ORAL_TABLET | Freq: Every day | ORAL | Status: DC

## 2020-05-20 MED ORDER — POTASSIUM CHLORIDE 20 MEQ PO PACK: 40.0000 meq | PACK | Freq: Once | ORAL | Status: DC

## 2020-05-20 MED ORDER — TRAZODONE HCL 50 MG PO TABS: 50.0000 mg | ORAL_TABLET | Freq: Every evening | ORAL | Status: DC | PRN

## 2020-05-20 MED ORDER — MAGNESIUM OXIDE 400 (241.3 MG) MG PO TABS
400.0000 mg | ORAL_TABLET | Freq: Once | ORAL | Status: AC
  Administered 2020-05-20: 400 mg via ORAL
  Filled 2020-05-20: qty 1

## 2020-05-20 MED ORDER — POTASSIUM CHLORIDE 20 MEQ PO PACK
40.0000 meq | PACK | Freq: Once | ORAL | Status: AC
  Administered 2020-05-20: 40 meq via ORAL
  Filled 2020-05-20: qty 2

## 2020-05-20 MED ORDER — HYDROXYZINE HCL 25 MG PO TABS
25.0000 mg | ORAL_TABLET | Freq: Three times a day (TID) | ORAL | Status: DC | PRN
  Administered 2020-05-20 (×2): 25 mg via ORAL
  Filled 2020-05-20 (×2): qty 1

## 2020-05-20 NOTE — ED Notes (Signed)
Pt requesting Tylenol for head pain. Pt medicated per MAR.

## 2020-05-20 NOTE — ED Notes (Signed)
Pt.s dinner has arrived.Pt sat up and given dinner tray. Will continue to monitor pt.

## 2020-05-20 NOTE — ED Notes (Signed)
Pt wandering around hallway naked-- given a blanket to cover up with, new scrubs given. cooperative at this time

## 2020-05-20 NOTE — ED Notes (Signed)
Pt talking to TTS. Pt was not compliant. Stating "I cannot talk to them, I am having a migraine and I need the meds to kick in." Pt had actually spoken to this writer in hopes of talking to someone about what would happen to her, a couple of minutes before the TTS machine was brought into the room. Pt walked out of the room and into the bathroom to do her hair. Pt given time to do hair before they spoke to TTS. Will continue to monitor pt.

## 2020-05-20 NOTE — ED Notes (Signed)
Pt.s lunch tray has arrived. Pt sitting up and eating her food. Pt asked for some sprite, sprite with ice given to pt. Will continue to monitor pt.

## 2020-05-20 NOTE — ED Notes (Signed)
Received a call from [facility]. Pt has a bed for tomorrow morning after 0800. Bed # 505-2. Call report anytime after 0800.

## 2020-05-20 NOTE — ED Notes (Signed)
Pt given snacks after she asked for some graham crackers. Pt given 2 graham crackers, 2 peanut butters and a sprite. Will continue to monitor pt.

## 2020-05-20 NOTE — ED Notes (Signed)
Belongings in locker 1 

## 2020-05-20 NOTE — ED Notes (Signed)
Pt transferred from [facility] to [facility].

## 2020-05-20 NOTE — ED Notes (Addendum)
Pt had become agitated. Asking this writer "may I be switched to the [place] behavioral facility". Told pt that we must talk to the RN instead. Pt stated that the RN told the patient to talk to this Clinical research associate. Explained to pt that that was possibly not the case, asked the RN for clarification. The pt had become even more agitated when explained further that the patient could not be transferred until the physician allowed to do so. Pt then stated "I will sue this place. You are letting me rot in here. It has been hours since my last medication. You all are letting me die here. I have kids to be with. I cannot stay here any longer". Explained to pt that we would do our best to get her to another facility when that time comes. Pt was then reasoned with when the RN provided meds with pt. Will continue to monitor pt.

## 2020-05-20 NOTE — BH Assessment (Addendum)
Assessment Note  Debra Sullivan is an 34 y.o. female with a history of  drug abuse, psychosis, bipolar 1 disorder, MDD, PTSD, cocaine use disorder, who presents to the emergency department today accompanied by [employer].  Patient presented to [facility] for psychiatric evaluation.  "Per [employer], patient was found wandering outside in her underwear.  They state that she was talking gibberish and also making comments that she wanted to end things."    Upon assessing patient on this day pt was a very poor historian during the assessment, answered few questions. Clinician asked the pt, "what brought you to the hospital?" Pt reported, "I am trying to get to [facility] and some how I ended up here".  Patient stating that she was trying to get to [facility] for her medications. Clinician ask patient about the type of medications she is prescribed from [facility]. Patient states, "Look at "My Chart" and see what's in my history". Patient says she is compliant with her medications and attends her appointments at [facility] She repeats several times, "I'm not right", "I'm not my self", "I need to get on track".  Patient denies suicidal ideations. No history of suicide attempts and/gestures. No history of self mutilating behaviors. She acknowledges the following depressive symptoms: Feeling angry/irritable, Feeling worthless/self pity, Loss of interest in usual pleasures, Fatigue, Isolating, Tearfulness. Appetite is good. Sleeping has decreased 5-6 hrs per night. Patient denies HI. Denies history of assaultive and aggressive behaviors. No AVH's. She reports a history of cocaine and alcohol use 1x per month. Last reported use of THC is 04/02/20. Las reported use of alcohol is a week ago. She reports a history of INPT treatment at [facility]. However, clinician unable to find any documented inpatient admission visits to [facility].   Patient was oriented to person, place, and time. Speech was pressured. She was restless. Mood is depressed. Her insight is  fair. Judgement is poor. Patient is dressed in scrubs. Mood is labile. Motor behavior is restless. Her thought processes are tangential.    Diagnosis: Bipolar I Disorder   Past Medical History:  Past Medical History:  Diagnosis Date  . Anemia   . Drug abuse (HCC)   . Psychosis (HCC) 2013    Past Surgical History:  Procedure Laterality Date  . CESAREAN SECTION  03/2016   pLTCS for twin B at [facility]  . LAPAROSCOPY N/A 04/14/2017   Procedure: LAPAROSCOPY OPERATIVE WITH RIGHT SALPINGECTOMY;  Surgeon: Conan Bowens, MD;  Location: [facility] ORS;  Service: Gynecology;  Laterality: N/A;  . TUBAL LIGATION Bilateral 01/22/2018   Procedure: POST PARTUM TUBAL LIGATION;  Surgeon: [name], DO;  Location: [facility] BIRTHING SUITES;  Service: Gynecology;  Laterality: Bilateral;    Family History:  Family History  Problem Relation Age of Onset  . Diabetes Mother   . Schizophrenia Mother   . Diabetes Brother   . Hypertension Maternal Aunt   . Healthy Father     Social History:  reports that she has been smoking cigarettes. She has been smoking about 0.25 packs per day. She has never used smokeless tobacco. She reports previous alcohol use. She reports previous drug use. Drugs: Marijuana and Cocaine.  Additional Social History:  Alcohol / Drug Use Pain Medications: See MAR Prescriptions: See MAR Over the Counter: See MAR History of alcohol / drug use?: Yes Longest period of sobriety (when/how long): NA Withdrawal Symptoms: Irritability, Agitation Substance #1 Name of Substance 1: THC 1 - Age of First Use: 19 yrs ago 1 -  Amount (size/oz): varies 1 - Frequency: 1x per month 1 - Duration: on-going 1 - Last Use / Amount: Apr 02, 2020 Substance #2 Name of Substance 2: Alcohol 2 - Age of First Use: 34 yrs old 2 - Amount (size/oz): 8 ounce cup per month 2 - Frequency: 1x per month 2 - Duration: on-going 2 - Last Use / Amount: "last week"; #1 (8 ounce cup)  CIWA: CIWA-Ar BP:  123/78 Pulse Rate: (!) 105 COWS:    Allergies:  Allergies  Allergen Reactions  . Naproxen Anaphylaxis    Home Medications: (Not in a hospital admission)   OB/GYN Status:  No LMP recorded.  General Assessment Data Location of Assessment: [facility] ED TTS Assessment: In system Patient Accompanied by::  ([employer]) Language Other than English: No Living Arrangements:  ("I live in hotel rooms or with friends") What gender do you identify as?: Female Date Telepsych consult ordered in [facility]:  (05/20/2020) Time Telepsych consult ordered in [facility]:  (n/a) Marital status: Single Maiden name:  Valentina Lucks ) Pregnancy Status: No Living Arrangements:  ("I live with friends or in hotels") Can pt return to current living arrangement?: Yes Admission Status: Voluntary Is patient capable of signing voluntary admission?: Yes Referral Source: Self/Family/Friend Insurance type:  Herbalist)  Medical Screening Exam [facility] ONLY) Medical Exam completed: No  Crisis Care Plan Living Arrangements:  ("I live with friends or in hotels") Legal Guardian:  (no legal guardian )  Education Status Is patient currently in school?: Yes Name of school:  [employer])  Risk to self with the past 6 months Suicidal Ideation: No Has patient been a risk to self within the past 6 months prior to admission? : No Suicidal Intent: No Has patient had any suicidal intent within the past 6 months prior to admission? : No Is patient at risk for suicide?: No Suicidal Plan?: No Has patient had any suicidal plan within the past 6 months prior to admission? : No Access to Means: No Previous Attempts/Gestures: No How many times?:  (n/a) Other Self Harm Risks:  (denies self harm ) Triggers for Past Attempts:  (no past suicide attempts and/or gestures ) Intentional Self Injurious Behavior: None Family Suicide History: Yes ("They all have mental health but it's not my business") Recent stressful life event(s):  ("Just my mental  state", "I'm not in the right state of mind") Persecutory voices/beliefs?: Yes Depression: Yes Depression Symptoms: Feeling angry/irritable, Feeling worthless/self pity, Loss of interest in usual pleasures, Fatigue, Isolating, Tearfulness Substance abuse history and/or treatment for substance abuse?: No Suicide prevention information given to non-admitted patients: Not applicable  Risk to Others within the past 6 months Homicidal Ideation: No Does patient have any lifetime risk of violence toward others beyond the six months prior to admission? : No Thoughts of Harm to Others: No Current Homicidal Intent: No Current Homicidal Plan: No Access to Homicidal Means: No Identified Victim:  (n/a) History of harm to others?: No Assessment of Violence: None Noted Violent Behavior Description:  (patient is calm and cooperative ) Does patient have access to weapons?: No Criminal Charges Pending?: No Does patient have a court date: No Is patient on probation?: No  Psychosis Hallucinations: Auditory ("I have a 3rd ear.Marland KitchenMarland KitchenI hear things before they are done") Delusions: Unspecified ("My have a six sense")  Mental Status Report Appearance/Hygiene: Unremarkable Eye Contact: Good Motor Activity: Freedom of movement Speech: Logical/coherent Level of Consciousness: Alert, Restless Mood: Depressed Affect: Appropriate to circumstance Anxiety Level: None Thought Processes: Relevant, Coherent Judgement:  Impaired Orientation: Person, Place, Time, Situation Obsessive Compulsive Thoughts/Behaviors: None  Cognitive Functioning Concentration: Normal Memory: Recent Intact, Remote Intact Is patient IDD: No Insight: Poor Impulse Control: Poor Appetite: Good Have you had any weight changes? : No Change Sleep: Decreased Total Hours of Sleep:  (5-6 hrs per night ) Vegetative Symptoms: None  ADLScreening [facility] Assessment Services) Patient's cognitive ability adequate to safely complete daily  activities?: Yes Patient able to express need for assistance with ADLs?: Yes Independently performs ADLs?: Yes (appropriate for developmental age)  Prior Inpatient Therapy Prior Inpatient Therapy: Yes Prior Therapy Dates:  (unable to find dates of hospitalizations in My Chart ) Prior Therapy Facilty/Provider(s):  (patient reports [facility] inpatient admissions) Reason for Treatment:  (medication management )  Prior Outpatient Therapy Prior Outpatient Therapy: No Does patient have an ACCT team?: No Does patient have Intensive In-House Services?  : No Does patient have [facility] services? : No Does patient have P4CC services?: No  ADL Screening (condition at time of admission) Patient's cognitive ability adequate to safely complete daily activities?: Yes Patient able to express need for assistance with ADLs?: Yes Independently performs ADLs?: Yes (appropriate for developmental age)                        Disposition: Per Maxie Barb, NP, patient meets INPT treatment. Disposition Counselor to seek appropriate placement.  Disposition Initial Assessment Completed for this Encounter: Yes  On Site Evaluation by:   Reviewed with Physician:    Melynda Ripple 05/20/2020 3:56 PM

## 2020-05-20 NOTE — ED Notes (Signed)
Dinner delivered.

## 2020-05-20 NOTE — ED Notes (Signed)
Tele psych machine to bedside  

## 2020-05-20 NOTE — BH Assessment (Addendum)
Per Maxie Barb, NP, patient recommended for inpatient treatment. Disposition Social Worker to seek appropriate bed placement.

## 2020-05-20 NOTE — ED Notes (Signed)
Lunch Tray Ordered @ 1027. °

## 2020-05-21 ENCOUNTER — Encounter (HOSPITAL_COMMUNITY): Payer: Self-pay | Admitting: Psychiatry

## 2020-05-21 ENCOUNTER — Inpatient Hospital Stay (HOSPITAL_COMMUNITY)
Admission: AD | Admit: 2020-05-21 | Discharge: 2020-05-25 | DRG: 885 | Disposition: A | Discharge status: Home / Self Care | Payer: Medicaid Other | Source: Ambulatory Visit | Attending: Psychiatry | Admitting: Psychiatry

## 2020-05-21 ENCOUNTER — Other Ambulatory Visit: Payer: Self-pay

## 2020-05-21 DIAGNOSIS — F319 Bipolar disorder, unspecified: Secondary | ICD-10-CM

## 2020-05-21 DIAGNOSIS — F419 Anxiety disorder, unspecified: Secondary | ICD-10-CM | POA: Diagnosis present

## 2020-05-21 DIAGNOSIS — F3160 Bipolar disorder, current episode mixed, unspecified: Principal | ICD-10-CM | POA: Diagnosis present

## 2020-05-21 DIAGNOSIS — F1910 Other psychoactive substance abuse, uncomplicated: Secondary | ICD-10-CM | POA: Diagnosis present

## 2020-05-21 DIAGNOSIS — F1994 Other psychoactive substance use, unspecified with psychoactive substance-induced mood disorder: Secondary | ICD-10-CM | POA: Diagnosis not present

## 2020-05-21 DIAGNOSIS — G4700 Insomnia, unspecified: Secondary | ICD-10-CM | POA: Diagnosis present

## 2020-05-21 DIAGNOSIS — F1920 Other psychoactive substance dependence, uncomplicated: Secondary | ICD-10-CM

## 2020-05-21 DIAGNOSIS — F1020 Alcohol dependence, uncomplicated: Secondary | ICD-10-CM | POA: Diagnosis present

## 2020-05-21 DIAGNOSIS — Z5900 Homelessness unspecified: Secondary | ICD-10-CM

## 2020-05-21 DIAGNOSIS — Z20822 Contact with and (suspected) exposure to covid-19: Secondary | ICD-10-CM | POA: Diagnosis present

## 2020-05-21 DIAGNOSIS — Z79899 Other long term (current) drug therapy: Secondary | ICD-10-CM

## 2020-05-21 DIAGNOSIS — F17210 Nicotine dependence, cigarettes, uncomplicated: Secondary | ICD-10-CM | POA: Diagnosis present

## 2020-05-21 DIAGNOSIS — E876 Hypokalemia: Secondary | ICD-10-CM | POA: Diagnosis present

## 2020-05-21 MED ORDER — LORAZEPAM 1 MG PO TABS: 1.0000 mg | ORAL_TABLET | Freq: Four times a day (QID) | ORAL | Status: DC | PRN

## 2020-05-21 MED ORDER — ZIPRASIDONE MESYLATE 20 MG IM SOLR: 20.0000 mg | Freq: Four times a day (QID) | INTRAMUSCULAR | Status: DC | PRN

## 2020-05-21 MED ORDER — RISPERIDONE 1 MG PO TBDP
1.0000 mg | ORAL_TABLET | Freq: Every day | ORAL | Status: DC
  Administered 2020-05-21: 1 mg via ORAL
  Filled 2020-05-21 (×4): qty 1

## 2020-05-21 MED ORDER — ACETAMINOPHEN 325 MG PO TABS: 650.0000 mg | ORAL_TABLET | Freq: Four times a day (QID) | ORAL | Status: DC | PRN

## 2020-05-21 MED ORDER — TRAZODONE HCL 50 MG PO TABS
50.0000 mg | ORAL_TABLET | Freq: Every evening | ORAL | Status: DC | PRN
  Administered 2020-05-22 – 2020-05-23 (×2): 50 mg via ORAL
  Filled 2020-05-21 (×4): qty 1

## 2020-05-21 MED ORDER — MAGNESIUM HYDROXIDE 400 MG/5ML PO SUSP: 30.0000 mL | Freq: Every day | ORAL | Status: DC | PRN

## 2020-05-21 MED ORDER — POTASSIUM CHLORIDE CRYS ER 20 MEQ PO TBCR
20.0000 meq | EXTENDED_RELEASE_TABLET | Freq: Two times a day (BID) | ORAL | Status: AC
  Administered 2020-05-21 – 2020-05-22 (×3): 20 meq via ORAL
  Filled 2020-05-21 (×4): qty 1

## 2020-05-21 MED ORDER — ALUM & MAG HYDROXIDE-SIMETH 200-200-20 MG/5ML PO SUSP: 30.0000 mL | ORAL | Status: DC | PRN

## 2020-05-21 MED ORDER — DOXYCYCLINE HYCLATE 100 MG PO TABS: 100.0000 mg | ORAL_TABLET | Freq: Two times a day (BID) | ORAL | Status: DC

## 2020-05-21 MED ORDER — HYDROXYZINE HCL 25 MG PO TABS
25.0000 mg | ORAL_TABLET | Freq: Three times a day (TID) | ORAL | Status: DC | PRN
  Administered 2020-05-21: 25 mg via ORAL
  Filled 2020-05-21 (×2): qty 1

## 2020-05-21 MED ORDER — NYSTATIN 100000 UNIT/GM EX OINT: TOPICAL_OINTMENT | Freq: Two times a day (BID) | CUTANEOUS | Status: DC

## 2020-05-21 MED ORDER — RISPERIDONE 2 MG PO TBDP: 2.0000 mg | ORAL_TABLET | Freq: Three times a day (TID) | ORAL | Status: DC | PRN

## 2020-05-21 MED ORDER — FOLIC ACID 1 MG PO TABS
1.0000 mg | ORAL_TABLET | Freq: Every day | ORAL | Status: DC
  Administered 2020-05-21 – 2020-05-25 (×5): 1 mg via ORAL
  Filled 2020-05-21 (×9): qty 1

## 2020-05-21 MED ORDER — THIAMINE HCL 100 MG PO TABS
100.0000 mg | ORAL_TABLET | Freq: Every day | ORAL | Status: DC
  Administered 2020-05-21 – 2020-05-25 (×5): 100 mg via ORAL
  Filled 2020-05-21 (×9): qty 1

## 2020-05-21 NOTE — ED Notes (Signed)
[employer] transport called 1021

## 2020-05-21 NOTE — ED Notes (Signed)
Resp. Even and non-labored. No distress noted. Will continue to monitor.  °

## 2020-05-21 NOTE — ED Notes (Signed)
Breakfast Ordered 

## 2020-05-21 NOTE — ED Notes (Signed)
No vitals were done per RN because pt was asleep. Vitals will be done as soon as PT awakes.

## 2020-05-21 NOTE — H&P (Signed)
Psychiatric Admission Assessment Adult  Patient Identification: Debra Sullivan MRN:  [id number] Date of Evaluation:  05/21/2020 Chief Complaint:  Bipolar 1 disorder (HCC) [F31.9] Principal Diagnosis: <principal problem not specified> Diagnosis:  Active Problems:   Bipolar 1 disorder (HCC)  History of Present Illness: Patient is seen and examined.  Patient is a 34 year old female with a past psychiatric history significant for polysubstance use disorders as well as reported bipolar disorder who originally presented to the [facility] emergency department on 05/19/2020 after having been found in the middle the street stating that she wanted to end things.  Bystanders called 911.  She was taken to the [facility] emergency department.  During the evaluation and the emergency department she denied suicidal ideation and denied drug use.  Unfortunately blood alcohol was 101, and drug screen was positive for cocaine, amphetamines and marijuana.  It was noted by the assessment team that she was a poor historian.  The only comment that she really made to the evaluator was that "I wanted to get to [facility]".  The decision was made to admit her to the psychiatric facility.  Upon arrival to our facility she is not a good historian and is irritable.  Most of what is collected from her old record.  Her last psychiatric hospitalization at our facility was on 03/27/2020.  She had presented to the emergency department at that time with Covid symptoms, and then presented to another emergency room with vaginal bleeding.  She refused a pelvic examination and refused blood draws at that time.  A psychiatric consultation was obtained and she was transported to the [facility] at that time.  It was found she had a previous history of bipolar disorder.  Her drug screen at that time was positive for cannabis.  Her previous hospitalizations revealed that she had been  discharged on Cogentin, Risperdal and trazodone.  On the September hospitalization she denied having taken medications since her previous discharge.  Review of the electronic medical record revealed that since 03/27/2020 she had had 5 emergency room visits.  Of those 5 visit she had presented to the [facility] behavioral health urgent care center on at least 3 occasions.  She has had at least 2 psychiatric hospitalizations at our facility.  Her most recent was on 03/27/2020.  Prior to that she had been hospitalized here in December 2019.  It is highly suspected that she has been noncompliant with treatment.  Associated Signs/Symptoms: Depression Symptoms:  insomnia, psychomotor agitation, fatigue, disturbed sleep, Duration of Depression Symptoms: No data recorded (Hypo) Manic Symptoms:  Impulsivity, Irritable Mood, Labiality of Mood, Anxiety Symptoms:  Excessive Worry, Psychotic Symptoms:  Denied Duration of Psychotic Symptoms: No data recorded PTSD Symptoms: Negative Total Time spent with patient: 30 minutes  Past Psychiatric History: This is the third psychiatric hospitalization at our facility since 2019.  Her last admission to our facility was on 03/27/2020.  She was discharged on Risperdal, trazodone and hydroxyzine.  These are the same medication she was discharged on previously.  She had one other hospitalization that she reported at an outside facility prior to 2019.  She denied any recent drug use, but blood alcohol was elevated, and drug screen was positive for marijuana, cocaine and amphetamines.  Is the patient at risk to self? Yes.    Has the patient been a risk to self in the past 6 months? Yes.    Has the patient been a risk to self  within the distant past? Yes.    Is the patient a risk to others? No.  Has the patient been a risk to others in the past 6 months? No.  Has the patient been a risk to others within the distant past? No.   Prior Inpatient Therapy:   Prior  Outpatient Therapy:    Alcohol Screening:   Substance Abuse History in the last 12 months:  Yes.   Consequences of Substance Abuse: Medical Consequences:  Clearly contributed to this admission. Previous Psychotropic Medications: Yes  Psychological Evaluations: Yes  Past Medical History:  Past Medical History:  Diagnosis Date   Anemia    Drug abuse (HCC)    Psychosis (HCC) 2013    Past Surgical History:  Procedure Laterality Date   CESAREAN SECTION  03/2016   pLTCS for twin B at [facility]   LAPAROSCOPY N/A 04/14/2017   Procedure: LAPAROSCOPY OPERATIVE WITH RIGHT SALPINGECTOMY;  Surgeon: Conan Bowens, MD;  Location: [facility];  Service: Gynecology;  Laterality: N/A;   TUBAL LIGATION Bilateral 01/22/2018   Procedure: POST PARTUM TUBAL LIGATION;  Surgeon: [name], DO;  Location: [facility];  Service: Gynecology;  Laterality: Bilateral;   Family History:  Family History  Problem Relation Age of Onset   Diabetes Mother    Schizophrenia Mother    Diabetes Brother    Hypertension Maternal Aunt    Healthy Father    Family Psychiatric  History: Unknown Tobacco Screening:   Social History:  Social History   Substance and Sexual Activity  Alcohol Use Not Currently   Alcohol/week: 0.0 standard drinks   Comment: socially     Social History   Substance and Sexual Activity  Drug Use Not Currently   Types: Marijuana, Cocaine   Comment: Cocaine & Marijuana was used10/26/2018    Additional Social History:                           Allergies:   Allergies  Allergen Reactions   Naproxen Anaphylaxis   Lab Results:  Results for orders placed or performed during the hospital encounter of 05/19/20 (from the past 48 hour(s))  Resp Panel by RT-PCR (Flu A&B, Covid) Nasopharyngeal Swab     Status: None   Collection Time: 05/20/20 12:14 AM   Specimen: Nasopharyngeal Swab; Nasopharyngeal(NP) swabs in vial transport medium  Result Value Ref Range    SARS Coronavirus 2 by RT PCR NEGATIVE NEGATIVE    Comment: (NOTE) SARS-CoV-2 target nucleic acids are NOT DETECTED.  The SARS-CoV-2 RNA is generally detectable in upper respiratory specimens during the acute phase of infection. The lowest concentration of SARS-CoV-2 viral copies this assay can detect is 138 copies/mL. A negative result does not preclude SARS-Cov-2 infection and should not be used as the sole basis for treatment or other patient management decisions. A negative result may occur with  improper specimen collection/handling, submission of specimen other than nasopharyngeal swab, presence of viral mutation(s) within the areas targeted by this assay, and inadequate number of viral copies(<138 copies/mL). A negative result must be combined with clinical observations, patient history, and epidemiological information. The expected result is Negative.  Fact Sheet for Patients:  BloggerCourse.com  Fact Sheet for Healthcare Providers:  SeriousBroker.it  This test is no t yet approved or cleared by the [place] FDA and  has been authorized for detection and/or diagnosis of SARS-CoV-2 by FDA under an Emergency Use Authorization (EUA). This EUA will remain  in effect (meaning this test can be used) for the duration of the COVID-19 declaration under Section 564(b)(1) of the Act, 21 U.S.C.section 360bbb-3(b)(1), unless the authorization is terminated  or revoked sooner.       Influenza A by PCR NEGATIVE NEGATIVE   Influenza B by PCR NEGATIVE NEGATIVE    Comment: (NOTE) The Xpert Xpress SARS-CoV-2/FLU/RSV plus assay is intended as an aid in the diagnosis of influenza from Nasopharyngeal swab specimens and should not be used as a sole basis for treatment. Nasal washings and aspirates are unacceptable for Xpert Xpress SARS-CoV-2/FLU/RSV testing.  Fact Sheet for Patients: BloggerCourse.com  Fact  Sheet for Healthcare Providers: SeriousBroker.it  This test is not yet approved or cleared by the [place] FDA and has been authorized for detection and/or diagnosis of SARS-CoV-2 by FDA under an Emergency Use Authorization (EUA). This EUA will remain in effect (meaning this test can be used) for the duration of the COVID-19 declaration under Section 564(b)(1) of the Act, 21 U.S.C. section 360bbb-3(b)(1), unless the authorization is terminated or revoked.  Performed at [facility], [address] [place], [place] [address]   Comprehensive metabolic panel     Status: Abnormal   Collection Time: 05/20/20 12:15 AM  Result Value Ref Range   Sodium 137 135 - 145 mmol/L   Potassium 2.8 (L) 3.5 - 5.1 mmol/L   Chloride 107 98 - 111 mmol/L   CO2 15 (L) 22 - 32 mmol/L   Glucose, Bld 149 (H) 70 - 99 mg/dL    Comment: Glucose reference range applies only to samples taken after fasting for at least 8 hours.   BUN 8 6 - 20 mg/dL   Creatinine, Ser 8.46 0.44 - 1.00 mg/dL   Calcium 8.7 (L) 8.9 - 10.3 mg/dL   Total Protein 6.9 6.5 - 8.1 g/dL   Albumin 3.8 3.5 - 5.0 g/dL   AST 23 15 - 41 U/L   ALT 20 0 - 44 U/L   Alkaline Phosphatase 34 (L) 38 - 126 U/L   Total Bilirubin 0.7 0.3 - 1.2 mg/dL   GFR, Estimated >96 >29 mL/min    Comment: (NOTE) Calculated using the CKD-EPI Creatinine Equation (2021)    Anion gap 15 5 - 15    Comment: Performed at [facility], [address], [place], [place] [address]  Ethanol     Status: Abnormal   Collection Time: 05/20/20 12:15 AM  Result Value Ref Range   Alcohol, Ethyl (B) 101 (H) <10 mg/dL    Comment: (NOTE) Lowest detectable limit for serum alcohol is 10 mg/dL.  For medical purposes only. Performed at [facility], [address], [place], [place] [address]   CBC with Diff     Status: Abnormal   Collection Time: 05/20/20 12:15 AM  Result Value Ref Range   WBC 9.0 4.0 - 10.5 K/uL   RBC 4.31 3.87 -  5.11 MIL/uL   Hemoglobin 10.4 (L) 12.0 - 15.0 g/dL   HCT 10.2 (L) 36 - 46 %   MCV 78.4 (L) 80.0 - 100.0 fL   MCH 24.1 (L) 26.0 - 34.0 pg   MCHC 30.8 30.0 - 36.0 g/dL   RDW 72.5 36.6 - 44.0 %   Platelets 304 150 - 400 K/uL   nRBC 0.0 0.0 - 0.2 %   Neutrophils Relative % 78 %   Neutro Abs 6.9 1.7 - 7.7 K/uL   Lymphocytes Relative 15 %   Lymphs Abs  1.4 0.7 - 4.0 K/uL   Monocytes Relative 7 %   Monocytes Absolute 0.6 0.1 - 1.0 K/uL   Eosinophils Relative 0 %   Eosinophils Absolute 0.0 0.0 - 0.5 K/uL   Basophils Relative 0 %   Basophils Absolute 0.0 0.0 - 0.1 K/uL   Immature Granulocytes 0 %   Abs Immature Granulocytes 0.03 0.00 - 0.07 K/uL    Comment: Performed at [facility], [address], [place], [place] [address]  Salicylate level     Status: Abnormal   Collection Time: 05/20/20 12:15 AM  Result Value Ref Range   Salicylate Lvl <7.0 (L) 7.0 - 30.0 mg/dL    Comment: Performed at [facility], [address] [place], [place] [address]  Acetaminophen level     Status: Abnormal   Collection Time: 05/20/20 12:15 AM  Result Value Ref Range   Acetaminophen (Tylenol), Serum <10 (L) 10 - 30 ug/mL    Comment: (NOTE) Therapeutic concentrations vary significantly. A range of 10-30 ug/mL  may be an effective concentration for many patients. However, some  are best treated at concentrations outside of this range. Acetaminophen concentrations >150 ug/mL at 4 hours after ingestion  and >50 ug/mL at 12 hours after ingestion are often associated with  toxic reactions.  Performed at [facility], [address], [place], [place] [address]   I-Stat beta hCG blood, ED     Status: None   Collection Time: 05/20/20 12:23 AM  Result Value Ref Range   I-stat hCG, quantitative <5.0 <5 mIU/mL   Comment 3            Comment:   GEST. AGE      CONC.  (mIU/mL)   <=1 WEEK        5 - 50     2 WEEKS       50 - 500     3 WEEKS       100 - 10,000     4 WEEKS     1,000 - 30,000         FEMALE AND NON-PREGNANT FEMALE:     LESS THAN 5 mIU/mL   I-Stat Chem 8, ED     Status: Abnormal   Collection Time: 05/20/20 12:25 AM  Result Value Ref Range   Sodium 139 135 - 145 mmol/L   Potassium 2.8 (L) 3.5 - 5.1 mmol/L   Chloride 107 98 - 111 mmol/L   BUN 10 6 - 20 mg/dL   Creatinine, Ser 9.14 0.44 - 1.00 mg/dL   Glucose, Bld 782 (H) 70 - 99 mg/dL    Comment: Glucose reference range applies only to samples taken after fasting for at least 8 hours.   Calcium, Ion 1.10 (L) 1.15 - 1.40 mmol/L   TCO2 17 (L) 22 - 32 mmol/L   Hemoglobin 12.2 12.0 - 15.0 g/dL   HCT 95.6 36 - 46 %  Urine rapid drug screen (hosp performed)     Status: Abnormal   Collection Time: 05/20/20  1:05 AM  Result Value Ref Range   Opiates NONE DETECTED NONE DETECTED   Cocaine POSITIVE (A) NONE DETECTED   Benzodiazepines NONE DETECTED NONE DETECTED   Amphetamines POSITIVE (A) NONE DETECTED   Tetrahydrocannabinol POSITIVE (A) NONE DETECTED   Barbiturates NONE DETECTED NONE DETECTED    Comment: (NOTE) DRUG SCREEN FOR MEDICAL PURPOSES ONLY.  IF CONFIRMATION IS NEEDED FOR ANY PURPOSE, NOTIFY LAB WITHIN 5 DAYS.  LOWEST DETECTABLE  LIMITS FOR URINE DRUG SCREEN Drug Class                     Cutoff (ng/mL) Amphetamine and metabolites    1000 Barbiturate and metabolites    200 Benzodiazepine                 200 Tricyclics and metabolites     300 Opiates and metabolites        300 Cocaine and metabolites        300 THC                            50 Performed at [facility], [address] [place], [place] [address]   Magnesium     Status: Abnormal   Collection Time: 05/20/20  3:55 AM  Result Value Ref Range   Magnesium 1.6 (L) 1.7 - 2.4 mg/dL    Comment: Performed at [facility], [address] [place], [place] [address]    Blood Alcohol level:  Lab Results  Component Value Date   ETH 101 (H) 05/20/2020   ETH <10 05/08/2020    Metabolic Disorder Labs:  Lab Results  Component  Value Date   HGBA1C 5.1 05/08/2020   MPG 99.67 05/08/2020   MPG 93.93 03/26/2020   Lab Results  Component Value Date   PROLACTIN 5.3 05/08/2020   PROLACTIN 43.5 (H) 04/16/2015   Lab Results  Component Value Date   CHOL 177 05/08/2020   TRIG 70 05/08/2020   HDL 69 05/08/2020   CHOLHDL 2.6 05/08/2020   VLDL 14 05/08/2020   LDLCALC 94 05/08/2020   LDLCALC 94 03/26/2020    Current Medications: Current Facility-Administered Medications  Medication Dose Route Frequency Provider Last Rate Last Admin   acetaminophen (TYLENOL) tablet 650 mg  650 mg Oral Q6H PRN Antonieta Pertlary, Jaskarn Schweer Lawson, MD       alum & mag hydroxide-simeth (MAALOX/MYLANTA) 200-200-20 MG/5ML suspension 30 mL  30 mL Oral Q4H PRN Antonieta Pertlary, Adyson Vanburen Lawson, MD       folic acid (FOLVITE) tablet 1 mg  1 mg Oral Daily Antonieta Pertlary, Corbitt Cloke Lawson, MD       hydrOXYzine (ATARAX/VISTARIL) tablet 25 mg  25 mg Oral TID PRN Antonieta Pertlary, Obera Stauch Lawson, MD       risperiDONE (RISPERDAL M-TABS) disintegrating tablet 2 mg  2 mg Oral Q8H PRN Antonieta Pertlary, Deontra Pereyra Lawson, MD       And   LORazepam (ATIVAN) tablet 1 mg  1 mg Oral Q6H PRN Antonieta Pertlary, Lynnet Hefley Lawson, MD       And   ziprasidone (GEODON) injection 20 mg  20 mg Intramuscular Q6H PRN Antonieta Pertlary, Boyd Buffalo Lawson, MD       LORazepam (ATIVAN) tablet 1 mg  1 mg Oral Q6H PRN Antonieta Pertlary, Henry Demeritt Lawson, MD       magnesium hydroxide (MILK OF MAGNESIA) suspension 30 mL  30 mL Oral Daily PRN Antonieta Pertlary, Zaraya Delauder Lawson, MD       risperiDONE (RISPERDAL M-TABS) disintegrating tablet 1 mg  1 mg Oral QHS Antonieta Pertlary, Tyray Proch Lawson, MD       thiamine tablet 100 mg  100 mg Oral Daily Antonieta Pertlary, Jancie Kercher Lawson, MD       traZODone (DESYREL) tablet 50 mg  50 mg Oral QHS PRN Antonieta Pertlary, Kaida Games Lawson, MD       PTA Medications: Medications Prior to Admission  Medication Sig Dispense Refill Last Dose   acetaminophen (TYLENOL)  325 MG tablet Take 650 mg by mouth every 6 (six) hours as needed for mild pain or headache.      hydrOXYzine (ATARAX/VISTARIL) 25 MG tablet Take 1 tablet  (25 mg total) by mouth 3 (three) times daily as needed for anxiety. 30 tablet 0    risperiDONE (RISPERDAL M-TABS) 1 MG disintegrating tablet Take 1 tablet (1 mg total) by mouth at bedtime. 30 tablet 0    traZODone (DESYREL) 50 MG tablet Take 1 tablet (50 mg total) by mouth at bedtime as needed for sleep. 30 tablet 0     Musculoskeletal: Strength & Muscle Tone: within normal limits Gait & Station: normal Patient leans: N/A  Psychiatric Specialty Exam: Physical Exam Vitals and nursing note reviewed.  HENT:     Head: Normocephalic and atraumatic.  Pulmonary:     Effort: Pulmonary effort is normal.  Neurological:     General: No focal deficit present.     Mental Status: She is alert.     Review of Systems  Blood pressure 99/75, pulse (!) 115, temperature 98.2 F (36.8 C), temperature source Oral, resp. rate 18, height 5\' 4"  (1.626 m), weight 61.2 kg, SpO2 100 %.Body mass index is 23.17 kg/m.  General Appearance: Disheveled  Eye Contact:  Minimal  Speech:  Normal Rate  Volume:  Increased  Mood:  Irritable  Affect:  Congruent  Thought Process:  Goal Directed and Descriptions of Associations: Circumstantial  Orientation:  Negative  Thought Content:  Rumination  Suicidal Thoughts:  No  Homicidal Thoughts:  No  Memory:  Immediate;   Poor Recent;   Poor Remote;   Poor  Judgement:  Impaired  Insight:  Lacking  Psychomotor Activity:  Increased  Concentration:  Concentration: Fair  Recall:  Poor  Fund of Knowledge:  Poor  Language:  Fair  Akathisia:  Negative  Handed:  Right  AIMS (if indicated):     Assets:  Desire for Improvement Resilience  ADL's:  Impaired  Cognition:  WNL  Sleep:       Treatment Plan Summary: Daily contact with patient to assess and evaluate symptoms and progress in treatment, Medication management and Plan : Patient is seen and examined.  Patient is a 34 year old female with the above-stated past psychiatric history who presented after being  brought to the emergency department by police after she was found wandering in the street.  She will be admitted to the hospital.  She will be integrated in the milieu.  She will be encouraged to attend groups.  Given the fact that she is done well with Risperdal and trazodone in the past we will restart those.  We will give her 1 mg p.o. nightly of the Risperdal, but we will also put in place and agitation protocol as needed.  Since her blood alcohol is elevated we will also put in place lorazepam 1 mg p.o. every 6 hours as needed a CIWA greater than 10.  We will also put in place folic acid as well as thiamine.  Review of her admission laboratories revealed a significantly low potassium at 2.8, and we will supplement that.  Her blood sugar was 141.  Creatinine was 0.90.  Liver function enzymes were normal.  Lipid panel from 11/20 was completely normal.  CBC showed a decreased MCV of 78.4 and an MCH of 24.1.  Platelets were 304,000.  Differential was normal.  Acetaminophen was less than 10, salicylate less than 7.  A prolactin from 11/20 was 5.3.  Beta-hCG was negative.  TSH was normal at 2.540.  Blood alcohol was 101.  Drug screen from 12/2 was positive for amphetamines, cocaine and marijuana.  Her EKG showed an incomplete right bundle branch block but is sinus rhythm.  QTc interval was within normal limits.  Currently her vital signs are stable, she is afebrile.  Observation Level/Precautions:  Detox 15 minute checks  Laboratory:  Chemistry Profile  Psychotherapy:    Medications:    Consultations:    Discharge Concerns:    Estimated LOS:  Other:     Physician Treatment Plan for Primary Diagnosis: <principal problem not specified> Long Term Goal(s): Improvement in symptoms so as ready for discharge  Short Term Goals: Ability to identify changes in lifestyle to reduce recurrence of condition will improve, Ability to verbalize feelings will improve, Ability to disclose and discuss suicidal ideas,  Ability to demonstrate self-control will improve, Ability to identify and develop effective coping behaviors will improve, Ability to maintain clinical measurements within normal limits will improve, Compliance with prescribed medications will improve and Ability to identify triggers associated with substance abuse/mental health issues will improve  Physician Treatment Plan for Secondary Diagnosis: Active Problems:   Bipolar 1 disorder (HCC)  Long Term Goal(s): Improvement in symptoms so as ready for discharge  Short Term Goals: Ability to identify changes in lifestyle to reduce recurrence of condition will improve, Ability to verbalize feelings will improve, Ability to disclose and discuss suicidal ideas, Ability to demonstrate self-control will improve, Ability to identify and develop effective coping behaviors will improve, Ability to maintain clinical measurements within normal limits will improve, Compliance with prescribed medications will improve and Ability to identify triggers associated with substance abuse/mental health issues will improve  I certify that inpatient services furnished can reasonably be expected to improve the patient's condition.    Antonieta Pert, MD 12/3/202112:14 PM

## 2020-05-21 NOTE — BHH Suicide Risk Assessment (Signed)
[facility] Admission Suicide Risk Assessment   Nursing information obtained from:    Demographic factors:    Current Mental Status:    Loss Factors:    Historical Factors:    Risk Reduction Factors:     Total Time spent with patient: 30 minutes Principal Problem: <principal problem not specified> Diagnosis:  Active Problems:   Bipolar 1 disorder (HCC)  Subjective Data: Patient is seen and examined.  Patient is a 34 year old female with a past psychiatric history significant for polysubstance use disorders as well as reported bipolar disorder who originally presented to the [facility] emergency department on 05/19/2020 after having been found in the middle the street stating that she wanted to end things.  Bystanders called 911.  She was taken to the [facility] emergency department.  During the evaluation and the emergency department she denied suicidal ideation and denied drug use.  Unfortunately blood alcohol was 101, and drug screen was positive for cocaine, amphetamines and marijuana.  It was noted by the assessment team that she was a poor historian.  The only comment that she really made to the evaluator was that "I wanted to get to [facility]".  The decision was made to admit her to the psychiatric facility.  Upon arrival to our facility she is not a good historian and is irritable.  Most of what is collected from her old record.  Her last psychiatric hospitalization at our facility was on 03/27/2020.  She had presented to the emergency department at that time with Covid symptoms, and then presented to another emergency room with vaginal bleeding.  She refused a pelvic examination and refused blood draws at that time.  A psychiatric consultation was obtained and she was transported to the [facility] at that time.  It was found she had a previous history of bipolar disorder.  Her drug screen at that time was positive for cannabis.  Her  previous hospitalizations revealed that she had been discharged on Cogentin, Risperdal and trazodone.  On the September hospitalization she denied having taken medications since her previous discharge.  Review of the electronic medical record revealed that since 03/27/2020 she had had 5 emergency room visits.  Of those 5 visit she had presented to the [facility] behavioral health urgent care center on at least 3 occasions.  She has had at least 2 psychiatric hospitalizations at our facility.  Her most recent was on 03/27/2020.  Prior to that she had been hospitalized here in December 2019.  It is highly suspected that she has been noncompliant with treatment.  Continued Clinical Symptoms:    The "Alcohol Use Disorders Identification Test", Guidelines for Use in Primary Care, Second Edition.  World Science writer [facility]). Score between 0-7:  no or low risk or alcohol related problems. Score between 8-15:  moderate risk of alcohol related problems. Score between 16-19:  high risk of alcohol related problems. Score 20 or above:  warrants further diagnostic evaluation for alcohol dependence and treatment.   CLINICAL FACTORS:   Bipolar Disorder:   Mixed State Alcohol/Substance Abuse/Dependencies   Musculoskeletal: Strength & Muscle Tone: within normal limits Gait & Station: normal Patient leans: N/A  Psychiatric Specialty Exam: Physical Exam Vitals and nursing note reviewed.  HENT:     Head: Normocephalic and atraumatic.  Pulmonary:     Effort: Pulmonary effort is normal.  Neurological:     General: No focal deficit present.     Mental Status: She is  alert and oriented to person, place, and time.     Review of Systems  Blood pressure 99/75, pulse (!) 115, temperature 98.2 F (36.8 C), temperature source Oral, resp. rate 18, height 5\' 4"  (1.626 m), weight 61.2 kg, SpO2 100 %.Body mass index is 23.17 kg/m.  General Appearance: Disheveled  Eye Contact:  Minimal  Speech:  Normal  Rate  Volume:  Increased  Mood:  Irritable  Affect:  Congruent  Thought Process:  Coherent and Descriptions of Associations: Circumstantial  Orientation:  Negative  Thought Content:  Rumination  Suicidal Thoughts:  No  Homicidal Thoughts:  No  Memory:  Immediate;   Poor Recent;   Poor Remote;   Poor  Judgement:  Impaired  Insight:  Lacking  Psychomotor Activity:  Normal  Concentration:  Concentration: Fair and Attention Span: Fair  Recall:  Poor  Fund of Knowledge:  Poor  Language:  Fair  Akathisia:  Negative  Handed:  Right  AIMS (if indicated):     Assets:  Desire for Improvement Resilience  ADL's:  Impaired  Cognition:  WNL  Sleep:         COGNITIVE FEATURES THAT CONTRIBUTE TO RISK:  Thought constriction (tunnel vision)    SUICIDE RISK:   Minimal: No identifiable suicidal ideation.  Patients presenting with no risk factors but with morbid ruminations; may be classified as minimal risk based on the severity of the depressive symptoms  PLAN OF CARE: Patient is seen and examined.  Patient is a 34 year old female with the above-stated past psychiatric history who presented after being brought to the emergency department by police after she was found wandering in the street.  She will be admitted to the hospital.  She will be integrated in the milieu.  She will be encouraged to attend groups.  Given the fact that she is done well with Risperdal and trazodone in the past we will restart those.  We will give her 1 mg p.o. nightly of the Risperdal, but we will also put in place and agitation protocol as needed.  Since her blood alcohol is elevated we will also put in place lorazepam 1 mg p.o. every 6 hours as needed a CIWA greater than 10.  We will also put in place folic acid as well as thiamine.  Review of her admission laboratories revealed a significantly low potassium at 2.8, and we will supplement that.  Her blood sugar was 141.  Creatinine was 0.90.  Liver function enzymes were  normal.  Lipid panel from 11/20 was completely normal.  CBC showed a decreased MCV of 78.4 and an MCH of 24.1.  Platelets were 304,000.  Differential was normal.  Acetaminophen was less than 10, salicylate less than 7.  A prolactin from 11/20 was 5.3.  Beta-hCG was negative.  TSH was normal at 2.540.  Blood alcohol was 101.  Drug screen from 12/2 was positive for amphetamines, cocaine and marijuana.  Her EKG showed an incomplete right bundle branch block but is sinus rhythm.  QTc interval was within normal limits.  Currently her vital signs are stable, she is afebrile.  I certify that inpatient services furnished can reasonably be expected to improve the patient's condition.   14/2, MD 05/21/2020, 12:02 PM

## 2020-05-21 NOTE — Tx Team (Signed)
Initial Treatment Plan 05/21/2020 12:42 PM [name] MCE:[id number]    PATIENT STRESSORS: Other: "None. Yall brought me here."   PATIENT STRENGTHS: Capable of independent living Physical Health   PATIENT IDENTIFIED PROBLEMS: "None"                     DISCHARGE CRITERIA:  Ability to meet basic life and health needs Adequate post-discharge living arrangements Improved stabilization in mood, thinking, and/or behavior  PRELIMINARY DISCHARGE PLAN: Attend aftercare/continuing care group  PATIENT/FAMILY INVOLVEMENT: This treatment plan has been presented to and reviewed with the patient, Debra Sullivan, and/or family member.  The patient and family have been given the opportunity to ask questions and make suggestions.  Layla Barter, RN 05/21/2020, 12:42 PM

## 2020-05-21 NOTE — Progress Notes (Signed)
Patient irritable on approach. She stated "Debra Sullivan brought me here." She reported, "the answer is no to all your questions." She was difficult to assess. The admission process was completed with the pt however, she answered no to all the questions. Vital signs assessed, skin assessed, belongings searched (no locker), and required documents reviewed with the pt and signed. The patient was escorted to the unit and oriented to the environment.

## 2020-05-21 NOTE — ED Notes (Signed)
Pt awake sitting up in bed. Breakfast tray at the bedside.

## 2020-05-22 DIAGNOSIS — F1994 Other psychoactive substance use, unspecified with psychoactive substance-induced mood disorder: Secondary | ICD-10-CM | POA: Diagnosis not present

## 2020-05-22 DIAGNOSIS — F319 Bipolar disorder, unspecified: Secondary | ICD-10-CM | POA: Diagnosis not present

## 2020-05-22 DIAGNOSIS — F1920 Other psychoactive substance dependence, uncomplicated: Secondary | ICD-10-CM | POA: Diagnosis not present

## 2020-05-22 MED ORDER — RISPERIDONE 2 MG PO TBDP
2.0000 mg | ORAL_TABLET | Freq: Every day | ORAL | Status: DC
  Administered 2020-05-22 – 2020-05-24 (×3): 2 mg via ORAL
  Filled 2020-05-22 (×5): qty 1

## 2020-05-22 MED ORDER — RISPERIDONE 0.25 MG PO TABS
0.2500 mg | ORAL_TABLET | Freq: Every day | ORAL | Status: DC
  Administered 2020-05-22 – 2020-05-25 (×4): 0.25 mg via ORAL
  Filled 2020-05-22 (×6): qty 1

## 2020-05-22 NOTE — Progress Notes (Signed)
   05/22/20 2100  Psych Admission Type (Psych Patients Only)  Admission Status Involuntary  Psychosocial Assessment  Eye Contact Brief  Facial Expression Angry;Anxious;Pensive  Affect Angry;Anxious;Irritable  Speech Logical/coherent  Interaction Cautious;Forwards little;Guarded;Minimal  Motor Activity Slow  Appearance/Hygiene Disheveled;Poor hygiene  Behavior Characteristics Cooperative  Mood Anxious;Preoccupied  Thought Administrator, sports thinking  Content Ambivalence  Delusions None reported or observed  Perception WDL  Hallucination None reported or observed  Judgment Poor  Confusion None  Danger to Self  Current suicidal ideation? Denies  Danger to Others  Danger to Others None reported or observed

## 2020-05-22 NOTE — BHH Group Notes (Signed)
°  BHH/BMU LCSW Group Therapy Note  Date/Time:  05/22/2020 11:15AM-12:00PM  Type of Therapy and Topic:  Group Therapy:  Feelings About Hospitalization  Participation Level:  Active   Description of Group This process group involved patients discussing their feelings related to being hospitalized, as well as the benefits they see to being in the hospital.  These feelings and benefits were itemized.  The group then brainstormed specific ways in which they could seek those same benefits when they discharge and return home.  Therapeutic Goals 1. Patient will identify and describe positive and negative feelings related to hospitalization 2. Patient will verbalize benefits of hospitalization to themselves personally 3. Patients will brainstorm together ways they can obtain similar benefits in the outpatient setting, identify barriers to wellness and possible solutions  Summary of Patient Progress:  The patient expressed her primary feelings about being hospitalized are that she does not want to be here but knows she needs help.  She needs to take her medications "more often," be around positive people, and stay off social media to stay well at discharge.  She did not mentioned her homelessness at all.  She did ask for a referral to the "[facility]." Therapeutic Modalities Cognitive Behavioral Therapy Motivational Interviewing    Ambrose Mantle, LCSW 05/22/2020, 1:04 PM

## 2020-05-22 NOTE — Progress Notes (Signed)
[facility] MD Progress Note  05/22/2020 12:00 PM Debra Sullivan  MRN:  [id number] Subjective: Patient is a 34 year old female with a past psychiatric history significant for polysubstance use disorder as well as bipolar disorder who presented to the [facility] emergency department on 05/19/2020 after having been found in the middle of the street wandering around and "wanting to end things".  Objective: Patient is seen and examined.  Patient is a 34 year old female with the above-stated past psychiatric history who is seen in follow-up.  She is irritable this morning.  She stated "I keep coming here and you will need to find me a place to stay".  Patient stated that her biggest problem is her homelessness.  She stated that after her last psychiatric hospitalization she was taken to the [facility].  She stated that that place was "terrible".  She stated she walked out after having been there for 1 day.  She stated that she panhandles her way back to the [place] area.  She stated that she needs a place to stay.  She stated that she is willing to go to drug rehab, but I suspect that is because she needs a place to stay.  She stated that we do not listen to her, and that we need to find her a safe living quarters.  She stated if we were unable to find her a place to stay she will kill her self.  Her vital signs are stable, she is afebrile.  Sleep was not recorded.  When we discussed the presence of amphetamines, cocaine and marijuana in her system she stated she uses because that is the only way she can cope with being homeless.  Principal Problem: <principal problem not specified> Diagnosis: Active Problems:   Bipolar 1 disorder (HCC)  Total Time spent with patient: 20 minutes  Past Psychiatric History: See admission H&P  Past Medical History:  Past Medical History:  Diagnosis Date  . Anemia   . Drug abuse (HCC)   . Psychosis (HCC) 2013    Past Surgical History:   Procedure Laterality Date  . CESAREAN SECTION  03/2016   pLTCS for twin B at [facility]  . LAPAROSCOPY N/A 04/14/2017   Procedure: LAPAROSCOPY OPERATIVE WITH RIGHT SALPINGECTOMY;  Surgeon: Conan Bowens, MD;  Location: [facility] ORS;  Service: Gynecology;  Laterality: N/A;  . TUBAL LIGATION Bilateral 01/22/2018   Procedure: POST PARTUM TUBAL LIGATION;  Surgeon: [name], DO;  Location: [facility]IRTHING [facility];  Service: Gynecology;  Laterality: Bilateral;   Family History:  Family History  Problem Relation Age of Onset  . Diabetes Mother   . Schizophrenia Mother   . Diabetes Brother   . Hypertension Maternal Aunt   . Healthy Father    Family Psychiatric  History: See admission H&P Social History:  Social History   Substance and Sexual Activity  Alcohol Use Not Currently  . Alcohol/week: 0.0 standard drinks   Comment: socially     Social History   Substance and Sexual Activity  Drug Use Not Currently  . Types: Marijuana, Cocaine   Comment: Cocaine & Marijuana was used10/26/2018    Social History   Socioeconomic History  . Marital status: Single    Spouse name: Not on file  . Number of children: Not on file  . Years of education: Not on file  . Highest education level: Not on file  Occupational History  . Not on file  Tobacco Use  . Smoking status: Current  Every Day Smoker    Packs/day: 0.25    Types: Cigarettes  . Smokeless tobacco: Never Used  Vaping Use  . Vaping Use: Never used  Substance and Sexual Activity  . Alcohol use: Not Currently    Alcohol/week: 0.0 standard drinks    Comment: socially  . Drug use: Not Currently    Types: Marijuana, Cocaine    Comment: Cocaine & Marijuana was used10/26/2018  . Sexual activity: Yes    Birth control/protection: None  Other Topics Concern  . Not on file  Social History Narrative  . Not on file   Social Determinants of Health   Financial Resource Strain:   . Difficulty of Paying Living Expenses: Not on file   Food Insecurity:   . Worried About Programme researcher, broadcasting/film/videounning Out of Food in the Last Year: Not on file  . Ran Out of Food in the Last Year: Not on file  Transportation Needs:   . Lack of Transportation (Medical): Not on file  . Lack of Transportation (Non-Medical): Not on file  Physical Activity:   . Days of Exercise per Week: Not on file  . Minutes of Exercise per Session: Not on file  Stress:   . Feeling of Stress : Not on file  Social Connections:   . Frequency of Communication with Friends and Family: Not on file  . Frequency of Social Gatherings with Friends and Family: Not on file  . Attends Religious Services: Not on file  . Active Member of Clubs or Organizations: Not on file  . Attends BankerClub or Organization Meetings: Not on file  . Marital Status: Not on file   Additional Social History:                         Sleep: Fair  Appetite:  Fair  Current Medications: Current Facility-Administered Medications  Medication Dose Route Frequency Provider Last Rate Last Admin  . acetaminophen (TYLENOL) tablet 650 mg  650 mg Oral Q6H PRN Antonieta Pertlary, Heena Woodbury Lawson, MD      . alum & mag hydroxide-simeth (MAALOX/MYLANTA) 200-200-20 MG/5ML suspension 30 mL  30 mL Oral Q4H PRN Antonieta Pertlary, Pinkney Venard Lawson, MD      . folic acid (FOLVITE) tablet 1 mg  1 mg Oral Daily Antonieta Pertlary, Eun Vermeer Lawson, MD   1 mg at 05/22/20 1107  . hydrOXYzine (ATARAX/VISTARIL) tablet 25 mg  25 mg Oral TID PRN Antonieta Pertlary, Icyss Skog Lawson, MD   25 mg at 05/21/20 1307  . risperiDONE (RISPERDAL M-TABS) disintegrating tablet 2 mg  2 mg Oral Q8H PRN Antonieta Pertlary, Matilde Pottenger Lawson, MD       And  . LORazepam (ATIVAN) tablet 1 mg  1 mg Oral Q6H PRN Antonieta Pertlary, Fayette Gasner Lawson, MD       And  . ziprasidone (GEODON) injection 20 mg  20 mg Intramuscular Q6H PRN Antonieta Pertlary, Nitesh Pitstick Lawson, MD      . LORazepam (ATIVAN) tablet 1 mg  1 mg Oral Q6H PRN Antonieta Pertlary, Charie Pinkus Lawson, MD      . magnesium hydroxide (MILK OF MAGNESIA) suspension 30 mL  30 mL Oral Daily PRN Antonieta Pertlary, Yordy Matton Lawson, MD      . potassium  chloride SA (KLOR-CON) CR tablet 20 mEq  20 mEq Oral BID Antonieta Pertlary, Laure Leone Lawson, MD   20 mEq at 05/22/20 1107  . risperiDONE (RISPERDAL M-TABS) disintegrating tablet 1 mg  1 mg Oral QHS Antonieta Pertlary, Alnita Aybar Lawson, MD   1 mg at 05/21/20 2215  . thiamine tablet 100 mg  100 mg Oral Daily Antonieta Pert, MD   100 mg at 05/22/20 1107  . traZODone (DESYREL) tablet 50 mg  50 mg Oral QHS PRN Antonieta Pert, MD        Lab Results: No results found for this or any previous visit (from the past 48 hour(s)).  Blood Alcohol level:  Lab Results  Component Value Date   ETH 101 (H) 05/20/2020   ETH <10 05/08/2020    Metabolic Disorder Labs: Lab Results  Component Value Date   HGBA1C 5.1 05/08/2020   MPG 99.67 05/08/2020   MPG 93.93 03/26/2020   Lab Results  Component Value Date   PROLACTIN 5.3 05/08/2020   PROLACTIN 43.5 (H) 04/16/2015   Lab Results  Component Value Date   CHOL 177 05/08/2020   TRIG 70 05/08/2020   HDL 69 05/08/2020   CHOLHDL 2.6 05/08/2020   VLDL 14 05/08/2020   LDLCALC 94 05/08/2020   LDLCALC 94 03/26/2020    Physical Findings: AIMS: Facial and Oral Movements Muscles of Facial Expression: None, normal Lips and Perioral Area: None, normal Jaw: None, normal Tongue: None, normal,Extremity Movements Upper (arms, wrists, hands, fingers): None, normal Lower (legs, knees, ankles, toes): None, normal, Trunk Movements Neck, shoulders, hips: None, normal, Overall Severity Severity of abnormal movements (highest score from questions above): None, normal Incapacitation due to abnormal movements: None, normal Patient's awareness of abnormal movements (rate only patient's report): No Awareness, Dental Status Current problems with teeth and/or dentures?: No Does patient usually wear dentures?: No  CIWA:    COWS:     Musculoskeletal: Strength & Muscle Tone: within normal limits Gait & Station: normal Patient leans: N/A  Psychiatric Specialty Exam: Physical Exam Vitals and  nursing note reviewed.  HENT:     Head: Normocephalic and atraumatic.  Pulmonary:     Effort: Pulmonary effort is normal.  Neurological:     General: No focal deficit present.     Mental Status: She is alert and oriented to person, place, and time.     Review of Systems  Blood pressure 110/69, pulse 99, temperature 97.7 F (36.5 C), temperature source Oral, resp. rate 18, height 5\' 4"  (1.626 m), weight 61.2 kg, SpO2 100 %.Body mass index is 23.17 kg/m.  General Appearance: Disheveled  Eye Contact:  Good  Speech:  Pressured  Volume:  Increased  Mood:  Irritable  Affect:  Congruent  Thought Process:  Coherent and Descriptions of Associations: Intact  Orientation:  Full (Time, Place, and Person)  Thought Content:  Rumination  Suicidal Thoughts:  Yes.  without intent/plan  Homicidal Thoughts:  No  Memory:  Immediate;   Poor Recent;   Poor Remote;   Poor  Judgement:  Impaired  Insight:  Lacking  Psychomotor Activity:  Increased  Concentration:  Concentration: Fair and Attention Span: Fair  Recall:  of Knowledge:  Fair  Language:  Good  Akathisia:  Negative  Handed:  Right  AIMS (if indicated):     Assets:  Desire for Improvement Resilience  ADL's:  Intact  Cognition:  WNL  Sleep:        Treatment Plan Summary: Daily contact with patient to assess and evaluate symptoms and progress in treatment, Medication management and Plan : Patient is seen and examined.  Patient is a 34 year old female with the above-stated past psychiatric history who is seen in follow-up.   Diagnosis: 1.  Polysubstance use disorder versus polysubstance dependence 2.  Substance-induced mood disorder 3.  Reported  history of bipolar disorder  Pertinent findings on examination today: 1.  Continued irritability 2.  Possible substance withdrawal syndrome 3.  Poor sleep  Plan: 1.  Increase Risperdal to half milligram p.o. daily and 2 mg p.o. nightly for mood stability, irritability and  sleep. 2.  Add Neurontin 100 mg p.o. 3 times daily for anxiety and alcohol dependence. 3.  Continue lorazepam 1 mg p.o. every 6 hours as needed a CIWA greater than 10. 4.  Reorder sexually transmitted disease urine sample. 5.  Patient had significant hypokalemia in the emergency department, and was supplemented yesterday.  We will repeat her metabolic panel in the a.m. tomorrow. 6.  Continue Risperdal as needed agitation protocol. 7.  Continue folic acid 1 mg p.o. daily for nutritional supplementation. 8.  Continue thiamine 100 mg p.o. daily for nutritional supplementation. 9.  Continue trazodone 50 mg p.o. nightly as needed insomnia. 10.  Have referred patient to social work with regard to placement and residential treatment. 11.  Disposition planning-in progress.  Antonieta Pert, MD 05/22/2020, 12:00 PM

## 2020-05-22 NOTE — Plan of Care (Signed)
Progress note  D: pt found in bed; compliant with medication administration. Pt denies any physical complaints or pain. Pt is guarded and minimal with assessment. Pt has been isolative to their most of the day resting. Pt is pleasant though. Pt denies si/hi/ah/vh and verbally agrees to approach staff if these become apparent or before harming themselves/others while at bhh.  A: Pt provided support and encouragement. Pt given medication per protocol and standing orders. Q71m safety checks implemented and continued.  R: Pt safe on the unit. Will continue to monitor.  Pt progressing in the following metrics  Problem: Education: Goal: Knowledge of [place] [facility] Education information/materials will improve Outcome: Progressing Goal: Emotional status will improve Outcome: Progressing Goal: Mental status will improve Outcome: Progressing Goal: Verbalization of understanding the information provided will improve Outcome: Progressing

## 2020-05-22 NOTE — Progress Notes (Signed)
Patient was isolative to her room the whole shift. She was compliant with medication and it was taken to her although. She remains guarded on approach and forwards little information. She seemed to sleep well through out the night with no issues to report on shift atthis time.

## 2020-05-23 DIAGNOSIS — F1920 Other psychoactive substance dependence, uncomplicated: Secondary | ICD-10-CM | POA: Diagnosis not present

## 2020-05-23 DIAGNOSIS — F1994 Other psychoactive substance use, unspecified with psychoactive substance-induced mood disorder: Secondary | ICD-10-CM | POA: Diagnosis not present

## 2020-05-23 DIAGNOSIS — F319 Bipolar disorder, unspecified: Secondary | ICD-10-CM | POA: Diagnosis not present

## 2020-05-23 LAB — RPR: RPR Ser Ql: NONREACTIVE

## 2020-05-23 NOTE — BHH Group Notes (Signed)
Psychoeducational Group Note  Date: 05-23-20 Time:  1300  Group Topic/Focus:  Making Healthy Choices:   The focus of this group is to help patients identify negative/unhealthy choices they were using prior to admission and identify positive/healthier coping strategies to replace them upon discharge.  Participation Level:  Did not attend   Kassem Kibbe A   

## 2020-05-23 NOTE — Plan of Care (Signed)
Nurse discussed anxiety, depression, coping skills with patient. 

## 2020-05-23 NOTE — Progress Notes (Signed)
Adult Psychoeducational Group Note  Date:  05/23/2020 Time:  9:56 PM  Group Topic/Focus:  Wrap-Up Group:   The focus of this group is to help patients review their daily goal of treatment and discuss progress on daily workbooks.  Participation Level:  Minimal  Participation Quality:  Appropriate  Affect:  Appropriate  Cognitive:  Appropriate  Insight: Limited  Engagement in Group:  Limited  Modes of Intervention:  Discussion  Additional Comments:  Pt stated her goal for today was to focus on her treatment plan. Pt stated she felt she accomplished her goal today. Pt stated she talk with her doctor and social worker, regarding her care today. Pt stated been able to contact her daughter, and female friend today, improved her overall day. Pt stated she took all her medication today from her providers. Pt rated her overall day a 10. Pt stated her appetite was pretty good today and she attended all meals. Pt stated her sleep last night was good. Pt stated the goal for tonight was to get some rest. Pt stated she was in no physical pain. Pt deny visual hallucinations. Pt admitted to experencing some auditory issues today. Pt stated the last time she experienced auditory issues was 2 hours ago. Pt nurse was updated on situation. Pt denies thoughts of harming herself or others. Pt stated she would alert staff if anything changes.  Felipa Furnace 05/23/2020, 9:56 PM

## 2020-05-23 NOTE — Progress Notes (Signed)
   05/23/20 2038  Psych Admission Type (Psych Patients Only)  Admission Status Involuntary  Psychosocial Assessment  Patient Complaints Anxiety  Eye Contact Brief  Facial Expression Pensive;Anxious  Affect Anxious;Irritable  Speech Logical/coherent  Interaction Cautious;Minimal  Motor Activity Slow  Appearance/Hygiene Disheveled  Behavior Characteristics Cooperative;Appropriate to situation  Mood Anxious;Preoccupied  Thought Process  Coherency Unable to assess  Content UTA  Delusions None reported or observed  Perception WDL  Hallucination Auditory;Command  Judgment Poor  Confusion None  Danger to Self  Current suicidal ideation? Denies  Danger to Others  Danger to Others None reported or observed  D: Patient presents with anxious affect and is minimal upon interaction but compliant with medications at this time. Patient denies SI/HI/VH at this time. When asked about AH patient reports hearing voices and states "they tell me things I don't want to do." Patient contracts for safety at this time.  A: Provided positive reinforcement and encouragement.  R: Patient cooperative and receptive to efforts. Patient remains safe on the unit.

## 2020-05-23 NOTE — Progress Notes (Signed)
[facility] MD Progress Note  05/23/2020 11:35 AM Debra Sullivan  MRN:  [id number] Subjective:  Patient is a 34 year old female with a past psychiatric history significant for polysubstance use disorder as well as bipolar disorder who presented to the [facility] emergency department on 05/19/2020 after having been found in the middle of the street wandering around and "wanting to end things".  Objective: Patient is seen and examined.  Patient is a 34 year old female with the above-stated past psychiatric history who is seen in follow-up.  She continues to slowly improve.  Her irritability has really decreased.  She is rather pleasant this morning.  She stated that she just wants to get along with everyone, and that her sleep and mood have improved since she has been here.  We continued to discuss disposition issues with regard to her homelessness issue and whether or not a residential substance abuse treatment program could be found for her.  She denied any auditory or visual hallucinations.  She denied any suicidal or homicidal ideation.  Her blood pressures a little low this morning at 87/60.  She is mildly tachycardic at a rate of 101.  She is afebrile.  She slept well at 8.5 hours last night.  No new laboratories.  Principal Problem: <principal problem not specified> Diagnosis: Active Problems:   Bipolar 1 disorder (HCC)  Total Time spent with patient: 20 minutes  Past Psychiatric History: See admission H&P  Past Medical History:  Past Medical History:  Diagnosis Date  . Anemia   . Drug abuse (HCC)   . Psychosis (HCC) 2013    Past Surgical History:  Procedure Laterality Date  . CESAREAN SECTION  03/2016   pLTCS for twin B at [facility]  . LAPAROSCOPY N/A 04/14/2017   Procedure: LAPAROSCOPY OPERATIVE WITH RIGHT SALPINGECTOMY;  Surgeon: Conan Bowens, MD;  Location: [facility] OR[facility];  Service: Gynecology;  Laterality: N/A;  . TUBAL LIGATION Bilateral 01/22/2018   Procedure: POST  PARTUM TUBAL LIGATION;  Surgeon: [name], DO;  Location: [facility];  Service: Gynecology;  Laterality: Bilateral;   Family History:  Family History  Problem Relation Age of Onset  . Diabetes Mother   . Schizophrenia Mother   . Diabetes Brother   . Hypertension Maternal Aunt   . Healthy Father    Family Psychiatric  History: See admission H&P Social History:  Social History   Substance and Sexual Activity  Alcohol Use Not Currently  . Alcohol/week: 0.0 standard drinks   Comment: socially     Social History   Substance and Sexual Activity  Drug Use Not Currently  . Types: Marijuana, Cocaine   Comment: Cocaine & Marijuana was used10/26/2018    Social History   Socioeconomic History  . Marital status: Single    Spouse name: Not on file  . Number of children: Not on file  . Years of education: Not on file  . Highest education level: Not on file  Occupational History  . Not on file  Tobacco Use  . Smoking status: Current Every Day Smoker    Packs/day: 0.25    Types: Cigarettes  . Smokeless tobacco: Never Used  Vaping Use  . Vaping Use: Never used  Substance and Sexual Activity  . Alcohol use: Not Currently    Alcohol/week: 0.0 standard drinks    Comment: socially  . Drug use: Not Currently    Types: Marijuana, Cocaine    Comment: Cocaine & Marijuana was used10/26/2018  . Sexual activity:  Yes    Birth control/protection: None  Other Topics Concern  . Not on file  Social History Narrative  . Not on file   Social Determinants of Health   Financial Resource Strain:   . Difficulty of Paying Living Expenses: Not on file  Food Insecurity:   . Worried About Programme researcher, broadcasting/film/videounning Out of Food in the Last Year: Not on file  . Ran Out of Food in the Last Year: Not on file  Transportation Needs:   . Lack of Transportation (Medical): Not on file  . Lack of Transportation (Non-Medical): Not on file  Physical Activity:   . Days of Exercise per Week: Not on file  .  Minutes of Exercise per Session: Not on file  Stress:   . Feeling of Stress : Not on file  Social Connections:   . Frequency of Communication with Friends and Family: Not on file  . Frequency of Social Gatherings with Friends and Family: Not on file  . Attends Religious Services: Not on file  . Active Member of Clubs or Organizations: Not on file  . Attends BankerClub or Organization Meetings: Not on file  . Marital Status: Not on file   Additional Social History:                         Sleep: Good  Appetite:  Good  Current Medications: Current Facility-Administered Medications  Medication Dose Route Frequency Provider Last Rate Last Admin  . acetaminophen (TYLENOL) tablet 650 mg  650 mg Oral Q6H PRN Antonieta Pertlary, Birch Farino Lawson, MD      . alum & mag hydroxide-simeth (MAALOX/MYLANTA) 200-200-20 MG/5ML suspension 30 mL  30 mL Oral Q4H PRN Antonieta Pertlary, Leler Brion Lawson, MD      . folic acid (FOLVITE) tablet 1 mg  1 mg Oral Daily Antonieta Pertlary, Gaynelle Pastrana Lawson, MD   1 mg at 05/23/20 0900  . hydrOXYzine (ATARAX/VISTARIL) tablet 25 mg  25 mg Oral TID PRN Antonieta Pertlary, Adonis Ryther Lawson, MD   25 mg at 05/21/20 1307  . risperiDONE (RISPERDAL M-TABS) disintegrating tablet 2 mg  2 mg Oral Q8H PRN Antonieta Pertlary, Jazir Newey Lawson, MD       And  . LORazepam (ATIVAN) tablet 1 mg  1 mg Oral Q6H PRN Antonieta Pertlary, Reiley Bertagnolli Lawson, MD       And  . ziprasidone (GEODON) injection 20 mg  20 mg Intramuscular Q6H PRN Antonieta Pertlary, Aleeya Veitch Lawson, MD      . LORazepam (ATIVAN) tablet 1 mg  1 mg Oral Q6H PRN Antonieta Pertlary, Marciana Uplinger Lawson, MD      . magnesium hydroxide (MILK OF MAGNESIA) suspension 30 mL  30 mL Oral Daily PRN Antonieta Pertlary,  Jon Lawson, MD      . risperiDONE (RISPERDAL M-TABS) disintegrating tablet 2 mg  2 mg Oral QHS Antonieta Pertlary, Zamoria Boss Lawson, MD   2 mg at 05/22/20 2031  . risperiDONE (RISPERDAL) tablet 0.25 mg  0.25 mg Oral Daily Antonieta Pertlary, Tavon Magnussen Lawson, MD   0.25 mg at 05/23/20 0900  . thiamine tablet 100 mg  100 mg Oral Daily Antonieta Pertlary, Keylan Costabile Lawson, MD   100 mg at 05/23/20 0900  . traZODone  (DESYREL) tablet 50 mg  50 mg Oral QHS PRN Antonieta Pertlary, Sammantha Mehlhaff Lawson, MD   50 mg at 05/22/20 2031    Lab Results: No results found for this or any previous visit (from the past 48 hour(s)).  Blood Alcohol level:  Lab Results  Component Value Date   ETH 101 (H) 05/20/2020   ETH <10  05/08/2020    Metabolic Disorder Labs: Lab Results  Component Value Date   HGBA1C 5.1 05/08/2020   MPG 99.67 05/08/2020   MPG 93.93 03/26/2020   Lab Results  Component Value Date   PROLACTIN 5.3 05/08/2020   PROLACTIN 43.5 (H) 04/16/2015   Lab Results  Component Value Date   CHOL 177 05/08/2020   TRIG 70 05/08/2020   HDL 69 05/08/2020   CHOLHDL 2.6 05/08/2020   VLDL 14 05/08/2020   LDLCALC 94 05/08/2020   LDLCALC 94 03/26/2020    Physical Findings: AIMS: Facial and Oral Movements Muscles of Facial Expression: None, normal Lips and Perioral Area: None, normal Jaw: None, normal Tongue: None, normal,Extremity Movements Upper (arms, wrists, hands, fingers): None, normal Lower (legs, knees, ankles, toes): None, normal, Trunk Movements Neck, shoulders, hips: None, normal, Overall Severity Severity of abnormal movements (highest score from questions above): None, normal Incapacitation due to abnormal movements: None, normal Patient's awareness of abnormal movements (rate only patient's report): No Awareness, Dental Status Current problems with teeth and/or dentures?: No Does patient usually wear dentures?: No  CIWA:    COWS:     Musculoskeletal: Strength & Muscle Tone: within normal limits Gait & Station: normal Patient leans: N/A  Psychiatric Specialty Exam: Physical Exam Vitals and nursing note reviewed.  Constitutional:      Appearance: Normal appearance.  HENT:     Head: Normocephalic and atraumatic.  Pulmonary:     Effort: Pulmonary effort is normal.  Neurological:     General: No focal deficit present.     Mental Status: She is alert and oriented to person, place, and time.      Review of Systems  Blood pressure (!) 87/60, pulse (!) 101, temperature 97.7 F (36.5 C), temperature source Oral, resp. rate 18, height 5\' 4"  (1.626 m), weight 61.2 kg, SpO2 100 %.Body mass index is 23.17 kg/m.  General Appearance: Casual  Eye Contact:  Fair  Speech:  Normal Rate  Volume:  Normal  Mood:  Dysphoric  Affect:  Congruent  Thought Process:  Coherent and Descriptions of Associations: Intact  Orientation:  Full (Time, Place, and Person)  Thought Content:  Logical  Suicidal Thoughts:  No  Homicidal Thoughts:  No  Memory:  Immediate;   Fair Recent;   Fair Remote;   Fair  Judgement:  Intact  Insight:  Fair  Psychomotor Activity:  Normal  Concentration:  Concentration: Good and Attention Span: Good  Recall:  of Knowledge:  Fair  Language:  Good  Akathisia:  Negative  Handed:  Right  AIMS (if indicated):     Assets:  Desire for Improvement Resilience  ADL's:  Intact  Cognition:  WNL  Sleep:  Number of Hours: 8.5     Treatment Plan Summary: Daily contact with patient to assess and evaluate symptoms and progress in treatment, Medication management and Plan : Patient is seen and examined.  Patient is a 34 year old female with the above-stated past psychiatric history who is seen in follow-up.  Diagnosis: 1.  Polysubstance use disorder versus polysubstance dependence 2.  Substance-induced mood disorder 3.  Reported history of bipolar disorder  Pertinent findings on examination today: 1.  Sleep is improved. 2.  Irritability has decreased. 3.  Blood pressure is low, but no other signs or symptoms of withdrawal syndromes. 4.  Denied suicidal or homicidal ideation. 5.  Denied any auditory or visual hallucinations.  Plan: 1.  Continue folic acid 1 mg p.o. daily for nutritional supplementation. 2.  Await urine for gonococcal/chlamydia probe sample. 3.  Continue hydroxyzine 25 mg p.o. 3 times daily as needed anxiety. 4.  Continue lorazepam 1 mg p.o.  every 6 hours as needed a CIWA greater than 10. 5.  Continue Risperdal based agitation protocol as needed. 6.  Continue Risperdal 0.25 milligrams p.o. daily and 2 mg p.o. nightly for mood stability and sleep. 7.  Continue thiamine 100 mg p.o. daily for nutritional supplementation. 8.  Continue trazodone 50 mg p.o. nightly as needed insomnia. 9.  Social work is involved in attempting to get the patient into some facility for homelessness as well as possible substance abuse treatment residential treatment. 10.  We will monitor low blood pressure. 11.  Disposition planning-in progress. Antonieta Pert, MD 05/23/2020, 11:35 AM

## 2020-05-23 NOTE — [facility] Group Notes (Signed)
[facility] LCSW Group Therapy Note  Date/Time:  05/23/2020  11:00AM-12:00PM  Type of Therapy and Topic:  Group Therapy:  Music and Mood  Participation Level:  Minimal   Description of Group: In this process group, members listened to a variety of genres of music and identified that different types of music evoke different responses.  Patients were encouraged to identify music that was soothing for them and music that was energizing for them.  Patients discussed how this knowledge can help with wellness and recovery in various ways including managing depression and anxiety as well as encouraging healthy sleep habits.    Therapeutic Goals: Patients will explore the impact of different varieties of music on mood Patients will verbalize the thoughts they have when listening to different types of music Patients will identify music that is soothing to them as well as music that is energizing to them Patients will discuss how to use this knowledge to assist in maintaining wellness and recovery Patients will explore the use of music as a coping skill  Summary of Patient Progress:  At the beginning of group, patient was not present.  She came when group was already underway, and left again before the end of group.  While there, she was attentive but did not respond to questions about how songs made her feel.    Therapeutic Modalities: Solution Focused Brief Therapy Activity   Ambrose Mantle, LCSW

## 2020-05-23 NOTE — Progress Notes (Signed)
D:  Patient's self inventory sheet, patient sleeps good, no sleep medication.  Good appetite, normal energy level, good concentration.  Denied depression, rated hopeless and anxiety 10.  Withdrawals, cravings, agitation, irritability.  Denied SI.  Denied physical problems.  Denied physical pain.  Goal is find stable shelter for me and daughter please.  Plans to stay positive.  Homeless, could ya'll help me, so sick of sleeping on streets.  No discharge plans. A:  Medications administered per MD orders.  Emotional support and encouragement given patient. R:  Denied SI and HI, contracts for safety.  Denied A/V hallucinations.  Safety maintained with 15 minute checks.

## 2020-05-23 NOTE — Progress Notes (Signed)
Adult Psychoeducational Group Note  Date:  05/23/2020 Time:  12:42 AM  Group Topic/Focus:  Wrap-Up Group:   The focus of this group is to help patients review their daily goal of treatment and discuss progress on daily workbooks.  Participation Level:  Minimal  Participation Quality:  Appropriate  Affect:  Flat and Irritable  Cognitive:  Appropriate  Insight: Limited  Engagement in Group:  Limited  Modes of Intervention:  Discussion  Additional Comments: Pt stated her goal for today was to focus on her treatment plan. Pt stated she felt she accomplished her goal today. Pt stated she talk with her doctor and social worker, regarding her care today. Pt stated she took all her medication today from her providers. Pt stated her relationship with her family and support team needs to be improved. Pt rated her overall day a 10 today. Pt stated she slept most of the day. Pt stated she would try to interact more with her peers tomorrow. Pt stated she felt better about herself today. Pt stated her appetite was pretty good today and she attend all meals today. Pt stated her sleep last night was good. Pt stated the goal for tonight was to get some rest. Pt stated she was in no physical pain. Pt deny auditory or visual hallucinations. Pt denies thoughts of harming herself or others. Pt stated she would alert staff if anything changes. Debra Sullivan 05/23/2020, 12:42 AM

## 2020-05-23 NOTE — [facility] Group Notes (Signed)
.  Psychoeducational Group Note  Date: 05-22-20 Time: 0900-1000    Goal Setting   Purpose of Group: This group helps to provide patients with the steps of setting a goal that is specific, measurable, attainable, realistic and time specific. A discussion on how we keep ourselves stuck with negative self talk.    Participation Level:  Active  Participation Quality:  Appropriate  Affect:  Appropriate  Cognitive:  Appropriate  Insight:  Improving  Engagement in Group:  Engaged  Additional Comments:  Pt is withdrawn. Sitting in a chair with legs pulled up into a ball . Rates her energy as an 8/10. States the reason she is here is because "I wasn't feeling good". Pt's goal is to attend the groups and to do the assignments.  Dione Housekeeper

## 2020-05-23 NOTE — [facility] Group Notes (Signed)
Adult Psychoeducational Group Not Date:  05/23/2020 Time:  0900-1045 Group Topic/Focus: PROGRESSIVE RELAXATION. A group where deep breathing is taught and tensing and relaxation muscle groups is used. Imagery is used as well.  Pts are asked to imagine 3 pillars that hold them up when they are not able to hold themselves up.  Participation Level:  Active  Participation Quality:  Appropriate  Affect:  Appropriate  Cognitive:  Oriented  Insight: Improving  Engagement in Group:  Engaged  Modes of Intervention:  Activity, Discussion, Education, and Support  Additional Comments:  Rates his energy at a 9/10. States God, music, prayers and safety hold her up when she is feeling down  Dione Housekeeper 05/23/2020`

## 2020-05-24 NOTE — [facility] Suicide Risk Assessment (Signed)
[facility] INPATIENT:  Family/Significant Other Suicide Prevention Education  Refusal of Consents:  Suicide Prevention Education:  Patient Refusal for Family/Significant Other Suicide Prevention Education: The patient Debra Sullivan has refused to provide written consent for family/significant other to be provided Family/Significant Other Suicide Prevention Education during admission and/or prior to discharge.  Physician notified.   SPE completed with patient, as patient refused to consent to family contact. SPI pamphlet provided to pt and pt was encouraged to share information with support network, ask questions, and talk about any concerns relating to SPE. Patient denies access to guns/firearms and verbalized understanding of information provided. Mobile Crisis information also provided to patient.   Ruthann Cancer MSW, LCSW Clincal Social Worker  [facility]

## 2020-05-24 NOTE — [facility] Counselor (Signed)
Patient shared that she spoke with admissions at [facility] and reports she may be accepted to their program.   CSW faxed records to [facility] for review.    Ruthann Cancer MSW, LCSW Clincal Social Worker  [facility]

## 2020-05-24 NOTE — Tx Team (Signed)
Interdisciplinary Treatment and Diagnostic Plan Update  05/24/2020 Time of Session: 9:05am Debra Sullivan MRN: [id number]  Principal Diagnosis: <principal problem not specified>  Secondary Diagnoses: Active Problems:   Bipolar 1 disorder (HCC)   Current Medications:  Current Facility-Administered Medications  Medication Dose Route Frequency Provider Last Rate Last Admin  . acetaminophen (TYLENOL) tablet 650 mg  650 mg Oral Q6H PRN Sharma Covert, MD      . alum & mag hydroxide-simeth (MAALOX/MYLANTA) 200-200-20 MG/5ML suspension 30 mL  30 mL Oral Q4H PRN Sharma Covert, MD      . folic acid (FOLVITE) tablet 1 mg  1 mg Oral Daily Sharma Covert, MD   1 mg at 05/24/20 2924  . hydrOXYzine (ATARAX/VISTARIL) tablet 25 mg  25 mg Oral TID PRN Sharma Covert, MD   25 mg at 05/21/20 1307  . risperiDONE (RISPERDAL M-TABS) disintegrating tablet 2 mg  2 mg Oral Q8H PRN Sharma Covert, MD       And  . LORazepam (ATIVAN) tablet 1 mg  1 mg Oral Q6H PRN Sharma Covert, MD       And  . ziprasidone (GEODON) injection 20 mg  20 mg Intramuscular Q6H PRN Sharma Covert, MD      . LORazepam (ATIVAN) tablet 1 mg  1 mg Oral Q6H PRN Sharma Covert, MD      . magnesium hydroxide (MILK OF MAGNESIA) suspension 30 mL  30 mL Oral Daily PRN Sharma Covert, MD      . risperiDONE (RISPERDAL M-TABS) disintegrating tablet 2 mg  2 mg Oral QHS Sharma Covert, MD   2 mg at 05/23/20 2038  . risperiDONE (RISPERDAL) tablet 0.25 mg  0.25 mg Oral Daily Sharma Covert, MD   0.25 mg at 05/24/20 0814  . thiamine tablet 100 mg  100 mg Oral Daily Sharma Covert, MD   100 mg at 05/24/20 4628  . traZODone (DESYREL) tablet 50 mg  50 mg Oral QHS PRN Sharma Covert, MD   50 mg at 05/23/20 2038   PTA Medications: Medications Prior to Admission  Medication Sig Dispense Refill Last Dose  . acetaminophen (TYLENOL) 325 MG tablet Take 650 mg by mouth every 6 (six) hours as needed for  mild pain or headache.     . hydrOXYzine (ATARAX/VISTARIL) 25 MG tablet Take 1 tablet (25 mg total) by mouth 3 (three) times daily as needed for anxiety. 30 tablet 0   . risperiDONE (RISPERDAL M-TABS) 1 MG disintegrating tablet Take 1 tablet (1 mg total) by mouth at bedtime. 30 tablet 0   . traZODone (DESYREL) 50 MG tablet Take 1 tablet (50 mg total) by mouth at bedtime as needed for sleep. 30 tablet 0     Patient Stressors: Other: "None. Earlie Server brought me here."  Patient Strengths: Capable of independent living Physical Health  Treatment Modalities: Medication Management, Group therapy, Case management,  1 to 1 session with clinician, Psychoeducation, Recreational therapy.   Physician Treatment Plan for Primary Diagnosis: <principal problem not specified> Long Term Goal(s): Improvement in symptoms so as ready for discharge Improvement in symptoms so as ready for discharge   Short Term Goals: Ability to identify changes in lifestyle to reduce recurrence of condition will improve Ability to verbalize feelings will improve Ability to disclose and discuss suicidal ideas Ability to demonstrate self-control will improve Ability to identify and develop effective coping behaviors will improve Ability to maintain clinical measurements within normal limits  will improve Compliance with prescribed medications will improve Ability to identify triggers associated with substance abuse/mental health issues will improve Ability to identify changes in lifestyle to reduce recurrence of condition will improve Ability to verbalize feelings will improve Ability to disclose and discuss suicidal ideas Ability to demonstrate self-control will improve Ability to identify and develop effective coping behaviors will improve Ability to maintain clinical measurements within normal limits will improve Compliance with prescribed medications will improve Ability to identify triggers associated with substance  abuse/mental health issues will improve  Medication Management: Evaluate patient's response, side effects, and tolerance of medication regimen.  Therapeutic Interventions: 1 to 1 sessions, Unit Group sessions and Medication administration.  Evaluation of Outcomes: Not Met  Physician Treatment Plan for Secondary Diagnosis: Active Problems:   Bipolar 1 disorder ([place])  Long Term Goal(s): Improvement in symptoms so as ready for discharge Improvement in symptoms so as ready for discharge   Short Term Goals: Ability to identify changes in lifestyle to reduce recurrence of condition will improve Ability to verbalize feelings will improve Ability to disclose and discuss suicidal ideas Ability to demonstrate self-control will improve Ability to identify and develop effective coping behaviors will improve Ability to maintain clinical measurements within normal limits will improve Compliance with prescribed medications will improve Ability to identify triggers associated with substance abuse/mental health issues will improve Ability to identify changes in lifestyle to reduce recurrence of condition will improve Ability to verbalize feelings will improve Ability to disclose and discuss suicidal ideas Ability to demonstrate self-control will improve Ability to identify and develop effective coping behaviors will improve Ability to maintain clinical measurements within normal limits will improve Compliance with prescribed medications will improve Ability to identify triggers associated with substance abuse/mental health issues will improve     Medication Management: Evaluate patient's response, side effects, and tolerance of medication regimen.  Therapeutic Interventions: 1 to 1 sessions, Unit Group sessions and Medication administration.  Evaluation of Outcomes: Not Met   RN Treatment Plan for Primary Diagnosis: <principal problem not specified> Long Term Goal(s): Knowledge of disease and  therapeutic regimen to maintain health will improve  Short Term Goals: Ability to remain free from injury will improve, Ability to verbalize frustration and anger appropriately will improve, Ability to participate in decision making will improve, Ability to identify and develop effective coping behaviors will improve and Compliance with prescribed medications will improve  Medication Management: RN will administer medications as ordered by provider, will assess and evaluate patient's response and provide education to patient for prescribed medication. RN will report any adverse and/or side effects to prescribing provider.  Therapeutic Interventions: 1 on 1 counseling sessions, Psychoeducation, Medication administration, Evaluate responses to treatment, Monitor vital signs and CBGs as ordered, Perform/monitor CIWA, COWS, AIMS and Fall Risk screenings as ordered, Perform wound care treatments as ordered.  Evaluation of Outcomes: Not Met   LCSW Treatment Plan for Primary Diagnosis: <principal problem not specified> Long Term Goal(s): Safe transition to appropriate next level of care at discharge, Engage patient in therapeutic group addressing interpersonal concerns.  Short Term Goals: Engage patient in aftercare planning with referrals and resources, Increase social support, Increase ability to appropriately verbalize feelings, Identify triggers associated with mental health/substance abuse issues and Increase skills for wellness and recovery  Therapeutic Interventions: Assess for all discharge needs, 1 to 1 time with Social worker, Explore available resources and support systems, Assess for adequacy in community support network, Educate family and significant other(s) on suicide prevention, Complete Psychosocial Assessment,  Interpersonal group therapy.  Evaluation of Outcomes: Not Met   Progress in Treatment: Attending groups: Yes. Participating in groups: Yes. and No. Taking medication as  prescribed: Yes. Toleration medication: Yes. Family/Significant other contact made: No, will contact:  if consent is provided Patient understands diagnosis: Yes. Discussing patient identified problems/goals with staff: Yes. Medical problems stabilized or resolved: Yes. Denies suicidal/homicidal ideation: Yes. Issues/concerns per patient self-inventory: No.   New problem(s) identified: No, Describe:  none  New Short Term/Long Term Goal(s): medication stabilization, elimination of SI thoughts, development of comprehensive mental wellness plan.   Patient Goals:  "To find somewhere to stay"  Discharge Plan or Barriers: Patient is currently homeless and is interested in shelter placement.  Reason for Continuation of Hospitalization: Delusions  Depression Medication stabilization Suicidal ideation  Estimated Length of Stay: 3-5 days  Attendees: Patient: Debra Sullivan 05/24/2020  Physician: Ernie Hew, MD 05/24/2020  Nursing:  05/24/2020   RN Care Manager: 05/24/2020  Social Worker: Darletta Moll, LCSW 05/24/2020   Recreational Therapist:  05/24/2020  Other:  05/24/2020  Other:  05/24/2020   Other: 05/24/2020       Scribe for Treatment Team: Vassie Moselle, LCSW 05/24/2020 9:35 AM
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
## 2020-05-24 NOTE — BHH Group Notes (Signed)
LCSW Group Therapy Notes  Type of Therapy and Topic: Group Therapy: Healthy Vs. Unhealthy Coping Strategies  Date and Time: 1:00PM  Participation Level: BHH PARTICIPATION LEVEL: Did Not Attend  Description of Group:  In this group, patients will be encouraged to explore their healthy and unhealthy coping strategics. Coping strategies are actions that we take to deal with stress, problems, or uncomfortable emotions in our daily lives. Each patient will be challenged to read some scenarios and discuss the unhealthy and healthy coping strategies within those scenarios. Also, each patient will be challenged to describe current healthy and unhealthy strategies that they use in their own lives and discuss the outcomes and barriers to those strategies. This group will be process-oriented, with patients participating in exploration of their own experiences as well as giving and receiving support and challenge from other group members.  Therapeutic Goals: 1. Patient will identify personal healthy and unhealthy coping strategies. 2. Patient will identify healthy and unhealthy coping strategies, in others, through scenarios.  3. Patient will identify expected outcomes of healthy and unhealthy coping strategies. 4. Patient will identify barriers to using healthy coping strategies.   Summary of Patient Progress: Patient did not attend.   Therapeutic Modalities:  Cognitive Behavioral Therapy Solution Focused Therapy Motivational Interviewing    Ruthann Cancer MSW, LCSW Clincal Social Worker  [facility]

## 2020-05-24 NOTE — BHH Counselor (Signed)
CSW faxed ACTT referral to [facility] for review.    Ruthann Cancer MSW, LCSW Clincal Social Worker  [facility]

## 2020-05-24 NOTE — Progress Notes (Signed)
Patient denies SI, HI and AVH.  Patient has attended groups engaged appropriately with staff and peers.  Patient had no incidents of behavioral dyscontrol.   Assess patient for safety, offer medications as prescribed, engage patient in 1:1 staff talks.   Continue to monitor as planned patient able to contract for safety.

## 2020-05-24 NOTE — Progress Notes (Signed)
CSW provided this patient with shelter and long term residential substance use treatment and encouraged this patient to call to check availability.     Ruthann Cancer MSW, LCSW Clincal Social Worker  [facility]

## 2020-05-24 NOTE — Progress Notes (Signed)
[facility] MD Progress Note  05/24/2020 11:36 AM Debra Sullivan  MRN:  [id number] Subjective: Patient is a 34 year old female with a past psychiatric history significant for polysubstance use disorder as well as bipolar disorder who presented to the [facility] emergency department on 05/19/2020 after having been found in the middle of the street wandering around and "wanting to end things".UDS+cocaine, THC, amphetamines  Objective: Chart reviewed; medication compliant, no PRNs for non redirectable agitation.   Patient is seen and examined.  Patient is a 34 year old female with the above-stated past psychiatric history who is seen in follow-up.  Patient remains focused on housing and being homeless. She states that she is "stressed out" about her housing situation and requests to speak to the SWer. In terms of risperdal; no SE/AE reported and states that the medication is "mellowing me out" which she finds to be positive. Denies SI/HI/AVH. She denies issues with sleep and appetite; states that she slept ~6 hours last night.    Principal Problem: <principal problem not specified> Diagnosis: Active Problems:   Bipolar 1 disorder (HCC)  Total Time spent with patient: 20 minutes  Past Psychiatric History: See admission H&P  Past Medical History:  Past Medical History:  Diagnosis Date  . Anemia   . Drug abuse (HCC)   . Psychosis (HCC) 2013    Past Surgical History:  Procedure Laterality Date  . CESAREAN SECTION  03/2016   pLTCS for twin B at [facility]  . LAPAROSCOPY N/A 04/14/2017   Procedure: LAPAROSCOPY OPERATIVE WITH RIGHT SALPINGECTOMY;  Surgeon: Conan Bowens, MD;  Location: [facility];  Service: Gynecology;  Laterality: N/A;  . TUBAL LIGATION Bilateral 01/22/2018   Procedure: POST PARTUM TUBAL LIGATION;  Surgeon: [name], DO;  Location: [facility];  Service: Gynecology;  Laterality: Bilateral;   Family History:  Family History  Problem Relation Age of  Onset  . Diabetes Mother   . Schizophrenia Mother   . Diabetes Brother   . Hypertension Maternal Aunt   . Healthy Father    Family Psychiatric  History: See admission H&P Social History:  Social History   Substance and Sexual Activity  Alcohol Use Not Currently  . Alcohol/week: 0.0 standard drinks   Comment: socially     Social History   Substance and Sexual Activity  Drug Use Not Currently  . Types: Marijuana, Cocaine   Comment: Cocaine & Marijuana was used10/26/2018    Social History   Socioeconomic History  . Marital status: Single    Spouse name: Not on file  . Number of children: Not on file  . Years of education: Not on file  . Highest education level: Not on file  Occupational History  . Not on file  Tobacco Use  . Smoking status: Current Every Day Smoker    Packs/day: 0.25    Types: Cigarettes  . Smokeless tobacco: Never Used  Vaping Use  . Vaping Use: Never used  Substance and Sexual Activity  . Alcohol use: Not Currently    Alcohol/week: 0.0 standard drinks    Comment: socially  . Drug use: Not Currently    Types: Marijuana, Cocaine    Comment: Cocaine & Marijuana was used10/26/2018  . Sexual activity: Yes    Birth control/protection: Debra Sullivan  Other Topics Concern  . Not on file  Social History Narrative  . Not on file   Social Determinants of Health   Financial Resource Strain:   . Difficulty of Paying Living Expenses: Not  on file  Food Insecurity:   . Worried About Programme researcher, broadcasting/film/video in the Last Year: Not on file  . Ran Out of Food in the Last Year: Not on file  Transportation Needs:   . Lack of Transportation (Medical): Not on file  . Lack of Transportation (Non-Medical): Not on file  Physical Activity:   . Days of Exercise per Week: Not on file  . Minutes of Exercise per Session: Not on file  Stress:   . Feeling of Stress : Not on file  Social Connections:   . Frequency of Communication with Friends and Family: Not on file  .  Frequency of Social Gatherings with Friends and Family: Not on file  . Attends Religious Services: Not on file  . Active Member of Clubs or Organizations: Not on file  . Attends Banker Meetings: Not on file  . Marital Status: Not on file   Additional Social History:                         Sleep: Fair  Appetite:  Fair  Current Medications: Current Facility-Administered Medications  Medication Dose Route Frequency Provider Last Rate Last Admin  . acetaminophen (TYLENOL) tablet 650 mg  650 mg Oral Q6H PRN Antonieta Pert, MD      . alum & mag hydroxide-simeth (MAALOX/MYLANTA) 200-200-20 MG/5ML suspension 30 mL  30 mL Oral Q4H PRN Antonieta Pert, MD      . folic acid (FOLVITE) tablet 1 mg  1 mg Oral Daily Antonieta Pert, MD   1 mg at 05/24/20 1191  . hydrOXYzine (ATARAX/VISTARIL) tablet 25 mg  25 mg Oral TID PRN Antonieta Pert, MD   25 mg at 05/21/20 1307  . risperiDONE (RISPERDAL M-TABS) disintegrating tablet 2 mg  2 mg Oral Q8H PRN Antonieta Pert, MD       And  . LORazepam (ATIVAN) tablet 1 mg  1 mg Oral Q6H PRN Antonieta Pert, MD       And  . ziprasidone (GEODON) injection 20 mg  20 mg Intramuscular Q6H PRN Antonieta Pert, MD      . LORazepam (ATIVAN) tablet 1 mg  1 mg Oral Q6H PRN Antonieta Pert, MD      . magnesium hydroxide (MILK OF MAGNESIA) suspension 30 mL  30 mL Oral Daily PRN Antonieta Pert, MD      . risperiDONE (RISPERDAL M-TABS) disintegrating tablet 2 mg  2 mg Oral QHS Antonieta Pert, MD   2 mg at 05/23/20 2038  . risperiDONE (RISPERDAL) tablet 0.25 mg  0.25 mg Oral Daily Antonieta Pert, MD   0.25 mg at 05/24/20 0814  . thiamine tablet 100 mg  100 mg Oral Daily Antonieta Pert, MD   100 mg at 05/24/20 4782  . traZODone (DESYREL) tablet 50 mg  50 mg Oral QHS PRN Antonieta Pert, MD   50 mg at 05/23/20 2038    Lab Results: No results found for this or any previous visit (from the past 48  hour(s)).  Blood Alcohol level:  Lab Results  Component Value Date   ETH 101 (H) 05/20/2020   ETH <10 05/08/2020    Metabolic Disorder Labs: Lab Results  Component Value Date   HGBA1C 5.1 05/08/2020   MPG 99.67 05/08/2020   MPG 93.93 03/26/2020   Lab Results  Component Value Date   PROLACTIN 5.3 05/08/2020   PROLACTIN 43.5 (  H) 04/16/2015   Lab Results  Component Value Date   CHOL 177 05/08/2020   TRIG 70 05/08/2020   HDL 69 05/08/2020   CHOLHDL 2.6 05/08/2020   VLDL 14 05/08/2020   LDLCALC 94 05/08/2020   LDLCALC 94 03/26/2020    Physical Findings: AIMS: Facial and Oral Movements Muscles of Facial Expression: Debra Sullivan, normal Lips and Perioral Area: Debra Sullivan, normal Jaw: Debra Sullivan, normal Tongue: Debra Sullivan, normal,Extremity Movements Upper (arms, wrists, hands, fingers): Debra Sullivan, normal Lower (legs, knees, ankles, toes): Debra Sullivan, normal, Trunk Movements Neck, shoulders, hips: Debra Sullivan, normal, Overall Severity Severity of abnormal movements (highest score from questions above): Debra Sullivan, normal Incapacitation due to abnormal movements: Debra Sullivan, normal Patient's awareness of abnormal movements (rate only patient's report): No Awareness, Dental Status Current problems with teeth and/or dentures?: No Does patient usually wear dentures?: No  CIWA:    COWS:     Musculoskeletal: Strength & Muscle Tone: within normal limits Gait & Station: normal Patient leans: N/A  Psychiatric Specialty Exam: Physical Exam Vitals and nursing note reviewed.  HENT:     Head: Normocephalic and atraumatic.  Pulmonary:     Effort: Pulmonary effort is normal.  Neurological:     General: No focal deficit present.     Mental Status: She is alert and oriented to person, place, and time.     Review of Systems  Constitutional: Negative for activity change, appetite change and chills.  Cardiovascular: Negative for chest pain.  Gastrointestinal: Negative for abdominal pain.  Musculoskeletal: Negative for arthralgias.   Neurological: Negative for facial asymmetry and headaches.  Psychiatric/Behavioral: Negative for hallucinations.    Blood pressure 103/66, pulse (!) 140, temperature 97.9 F (36.6 C), temperature source Oral, resp. rate 18, height 5\' 4"  (1.626 m), weight 61.2 kg, SpO2 100 %.Body mass index is 23.17 kg/m.  General Appearance: Disheveled  Eye Contact:  Good  Speech:  rapid  Volume:  Increased  Mood:  Irritable  Affect:  Congruent  Thought Process:  Coherent and Descriptions of Associations: Intact  Orientation:  Full (Time, Place, and Person)  Thought Content:  Rumination  Suicidal Thoughts:  Yes.  without intent/plan  Homicidal Thoughts:  No  Memory:  Immediate;   Fair Recent;   Fair Remote;   Fair  Judgement:  Impaired  Insight:  Lacking  Psychomotor Activity:  Normal  Concentration:  Concentration: Fair and Attention Span: Fair  Recall:  of Knowledge:  Fair  Language:  Good  Akathisia:  Negative  Handed:  Right  AIMS (if indicated):     Assets:  Desire for Improvement Resilience  ADL's:  Intact  Cognition:  WNL  Sleep:  Number of Hours: 6.75     Treatment Plan Summary: Daily contact with patient to assess and evaluate symptoms and progress in treatment, Medication management and Plan : Patient is seen and examined.  Patient is a 34 year old female with the above-stated past psychiatric history who is seen in follow-up.   Diagnosis: 1.  Polysubstance use disorder versus polysubstance dependence 2.  Substance-induced mood disorder 3.  Reported history of bipolar disorder  Pertinent findings on examination today: 1.  Continued irritability 2.  Possible substance withdrawal syndrome 3.  Poor sleep; improving  Plan: 1.  Continue Risperdal to half milligram p.o. daily and 2 mg p.o. nightly for mood stability, irritability and sleep. 2.  Continue lorazepam 1 mg p.o. every 6 hours as needed a CIWA greater than 10. 3.  Reorder sexually transmitted disease  urine sample. 4.  Patient  had significant hypokalemia in the emergency department, and was supplemented yesterday.  We will repeat her metabolic panel in the a.m. tomorrow. 5.  Continue Risperdal as needed agitation protocol. 6.  Continue folic acid 1 mg p.o. daily for nutritional supplementation. 7.  Continue thiamine 100 mg p.o. daily for nutritional supplementation. 8.  Continue trazodone 50 mg p.o. nightly as needed insomnia. 9.  Have referred patient to social work with regard to placement and residential treatment. 10.  Disposition planning-in progress.  Estella HuskKatherine S Nickoli Bagheri, MD 05/24/2020, 11:36 AM

## 2020-05-25 MED ORDER — NICOTINE 21 MG/24HR TD PT24
21.0000 mg | MEDICATED_PATCH | Freq: Every day | TRANSDERMAL | Status: DC
  Administered 2020-05-25: 21 mg via TRANSDERMAL
  Filled 2020-05-25 (×4): qty 1

## 2020-05-25 MED ORDER — TRAZODONE HCL 50 MG PO TABS: 50.0000 mg | ORAL_TABLET | Freq: Every evening | ORAL | 0 refills | Status: AC | PRN

## 2020-05-25 MED ORDER — RISPERIDONE 2 MG PO TBDP: 2.0000 mg | ORAL_TABLET | Freq: Every day | ORAL | 0 refills | Status: AC

## 2020-05-25 MED ORDER — HYDROXYZINE HCL 25 MG PO TABS: 25.0000 mg | ORAL_TABLET | Freq: Three times a day (TID) | ORAL | 0 refills | Status: AC | PRN

## 2020-05-25 MED ORDER — RISPERIDONE 0.25 MG PO TABS: 0.2500 mg | ORAL_TABLET | Freq: Every day | ORAL | 0 refills | Status: AC

## 2020-05-25 NOTE — Progress Notes (Signed)
  [facility] Adult Case Management Discharge Plan :  Will you be returning to the same living situation after discharge:  No. Will be staying with brother At discharge, do you have transportation home?: No. Safe Transport will be arranged. Do you have the ability to pay for your medications: Yes,  has insurance   Release of information consent forms completed and in the chart;  [name] needed at discharge.  Patient to Follow up at:  Follow-up Information    [facility]. Call.   Why: A referral has been placed for ACTT services. Please call at discharge to schedule intake appointment.  Contact information: [address] [place] [place] [address] [phone]               Next level of care provider has access to [facility] Link:no  Safety Planning and Suicide Prevention discussed: Yes,  with patient  Have you used any form of tobacco in the last 30 days? (Cigarettes, Smokeless Tobacco, Cigars, and/or Pipes): Yes  Has patient been referred to the Quitline?: Patient refused referral  Patient has been referred for addiction treatment: Pt. refused referral  Otelia Santee, LCSW 05/25/2020, 1:39 PM

## 2020-05-25 NOTE — Plan of Care (Signed)
Discharge note  Patient verbalizes readiness for discharge. Follow up plan explained, AVS, Transition record and SRA given. Prescriptions and teaching provided. Belongings returned and signed for. Suicide safety plan completed and signed. Patient verbalizes understanding. Patient denies SI/HI and assures this Clinical research associate they will seek assistance should that change. Patient discharged to lobby to wait for safe transport.  Problem: Education: Goal: Knowledge of [facility] Education information/materials will improve Outcome: Adequate for Discharge Goal: Emotional status will improve Outcome: Adequate for Discharge Goal: Mental status will improve Outcome: Adequate for Discharge Goal: Verbalization of understanding the information provided will improve Outcome: Adequate for Discharge   Problem: Activity: Goal: Interest or engagement in activities will improve Outcome: Adequate for Discharge Goal: Sleeping patterns will improve Outcome: Adequate for Discharge   Problem: Coping: Goal: Ability to verbalize frustrations and anger appropriately will improve Outcome: Adequate for Discharge Goal: Ability to demonstrate self-control will improve Outcome: Adequate for Discharge   Problem: Health Behavior/Discharge Planning: Goal: Identification of resources available to assist in meeting health care needs will improve Outcome: Adequate for Discharge Goal: Compliance with treatment plan for underlying cause of condition will improve Outcome: Adequate for Discharge   Problem: Physical Regulation: Goal: Ability to maintain clinical measurements within normal limits will improve Outcome: Adequate for Discharge   Problem: Safety: Goal: Periods of time without injury will increase Outcome: Adequate for Discharge   Problem: Education: Goal: Ability to make informed decisions regarding treatment will improve Outcome: Adequate for Discharge   Problem: Coping: Goal: Coping ability will  improve Outcome: Adequate for Discharge   Problem: Health Behavior/Discharge Planning: Goal: Identification of resources available to assist in meeting health care needs will improve Outcome: Adequate for Discharge   Problem: Medication: Goal: Compliance with prescribed medication regimen will improve Outcome: Adequate for Discharge   Problem: Self-Concept: Goal: Ability to disclose and discuss suicidal ideas will improve Outcome: Adequate for Discharge Goal: Will verbalize positive feelings about self Outcome: Adequate for Discharge   Problem: Activity: Goal: Will verbalize the importance of balancing activity with adequate rest periods Outcome: Adequate for Discharge   Problem: Education: Goal: Will be free of psychotic symptoms Outcome: Adequate for Discharge Goal: Knowledge of the prescribed therapeutic regimen will improve Outcome: Adequate for Discharge   Problem: Coping: Goal: Coping ability will improve Outcome: Adequate for Discharge Goal: Will verbalize feelings Outcome: Adequate for Discharge   Problem: Health Behavior/Discharge Planning: Goal: Compliance with prescribed medication regimen will improve Outcome: Adequate for Discharge   Problem: Nutritional: Goal: Ability to achieve adequate nutritional intake will improve Outcome: Adequate for Discharge   Problem: Role Relationship: Goal: Ability to communicate needs accurately will improve Outcome: Adequate for Discharge Goal: Ability to interact with others will improve Outcome: Adequate for Discharge   Problem: Safety: Goal: Ability to redirect hostility and anger into socially appropriate behaviors will improve Outcome: Adequate for Discharge Goal: Ability to remain free from injury will improve Outcome: Adequate for Discharge   Problem: Self-Care: Goal: Ability to participate in self-care as condition permits will improve Outcome: Adequate for Discharge   Problem: Self-Concept: Goal: Will  verbalize positive feelings about self Outcome: Adequate for Discharge

## 2020-05-25 NOTE — Progress Notes (Signed)
   05/25/20 0900  Psych Admission Type (Psych Patients Only)  Admission Status Involuntary  Psychosocial Assessment  Patient Complaints Anxiety;Depression  Eye Contact Fair  Facial Expression Anxious;Pensive  Affect Anxious;Depressed  Speech Logical/coherent  Interaction Cautious;Forwards little;Minimal  Motor Activity Slow  Appearance/Hygiene Improved  Behavior Characteristics Cooperative;Appropriate to situation;Anxious  Mood Anxious;Pleasant  Thought Process  Coherency Concrete thinking  Content Paranoia  Delusions Paranoid  Perception WDL  Hallucination None reported or observed  Judgment Poor  Confusion None  Danger to Self  Current suicidal ideation? Denies  Danger to Others  Danger to Others None reported or observed

## 2020-05-25 NOTE — Progress Notes (Signed)
Adult Psychoeducational Group Note  Date:  05/25/2020 Time:  1:14 AM  Group Topic/Focus:  Wrap-Up Group:   The focus of this group is to help patients review their daily goal of treatment and discuss progress on daily workbooks.  Participation Level:  Did Not Attend  Participation Quality:  Did Not Attend  Affect:  Did Not Attend  Cognitive:  Did Not Attend  Insight: None  Engagement in Group:  Did Not Attend  Modes of Intervention:  Did Not Attend  Additional Comments:  Pt did not attend evening wrap up group tonight.  Felipa Furnace 05/25/2020, 1:14 AM

## 2020-05-25 NOTE — BHH Counselor (Signed)
CSW spoke with this patient about admissions to [facility]. Patient reported that she no longer wants to go to this program due the intensity, length, and rules for this program.  CSW provided this patient with information for [facility] and encouraged this patient to reach out to other resources provided to continue to seek placement.  CSW informed this patient of discharge date of 05/26/20.    Ruthann Cancer MSW, LCSW Clincal Social Worker  [facility]

## 2020-05-25 NOTE — BHH Suicide Risk Assessment (Signed)
[facility] Discharge Suicide Risk Assessment   Principal Problem: Bipolar 1 disorder [facility]) Discharge Diagnoses: Principal Problem:   Bipolar 1 disorder (HCC)   Total Time spent with patient: 20 minutes  Musculoskeletal: Strength & Muscle Tone: within normal limits Gait & Station: normal Patient leans: N/A  Psychiatric Specialty Exam: Review of Systems  Constitutional: Negative for activity change and appetite change.  Respiratory: Negative for chest tightness.   Cardiovascular: Negative for chest pain.  Gastrointestinal: Negative for abdominal pain.  Musculoskeletal: Negative for arthralgias.  Neurological: Negative for facial asymmetry and headaches.  Psychiatric/Behavioral: Negative for dysphoric mood and suicidal ideas.    Blood pressure (!) 96/59, pulse (!) 120, temperature 97.6 F (36.4 C), temperature source Oral, resp. rate 18, height 5\' 4"  (1.626 m), weight 61.2 kg, SpO2 100 %.Body mass index is 23.17 kg/m.  General Appearance: Casual and Fairly Groomed  Eye Contact::  Good  Speech:  Clear and Coherent and Normal Rate409  Volume:  Normal  Mood:  "ok"  Affect:  Appropriate and Constricted  Thought Process:  Coherent, Goal Directed and Linear  Orientation:  Full (Time, Place, and Person)  Thought Content:  WDL and Logical  Suicidal Thoughts:  No  Homicidal Thoughts:  No  Memory:  Immediate;   Good Recent;   Good Remote;   Good  Judgement:  Good  Insight:  Good  Psychomotor Activity:  Normal  Concentration:  Good  Recall:  Good  Fund of Knowledge:Good  Language: Good  Akathisia:  No  Handed:  Right  AIMS (if indicated):     Assets:  Communication Skills Desire for Improvement Social Support  Sleep:  Number of Hours: 6.75  Cognition: WNL  ADL's:  Intact   Mental Status Per Nursing Assessment::   On Admission:  Suicidal ideation indicated by others, Self-harm behaviors  Demographic Factors:  Low socioeconomic status, Living alone and Unemployed  Loss  Factors: Financial problems/change in socioeconomic status  Historical Factors: Impulsivity  Risk Reduction Factors:   Positive social support  Continued Clinical Symptoms:  Alcohol/Substance Abuse/Dependencies More than one psychiatric diagnosis  Cognitive Features That Contribute To Risk:  None    Suicide Risk:  Minimal: No identifiable suicidal ideation.  Patients presenting with no risk factors but with morbid ruminations; may be classified as minimal risk based on the severity of the depressive symptoms    Plan Of Care/Follow-up recommendations:  Activity:  as tolerated Diet:  regular Other:      Patient is instructed prior to discharge to: Take all medications as prescribed by his/her mental healthcare provider. Report any adverse effects and or reactions from the medicines to his/her outpatient provider promptly. Patient has been instructed & cautioned: To not engage in alcohol and or illegal drug use while on prescription medicines. In the event of worsening symptoms, patient is instructed to call the crisis hotline, 911 and or go to the nearest ED for appropriate evaluation and treatment of symptoms. To follow-up with his/her primary care provider for your other medical issues, concerns and or health care needs.  Patient to discharge to family member's house; will pursue rehab outside of the hospital setting     [phone], MD 05/25/2020, 1:27 PM

## 2020-05-25 NOTE — BHH Counselor (Signed)
CSW faxed records to [facility] for admissions for this patient.    Ruthann Cancer MSW, LCSW Clincal Social Worker  [facility]

## 2020-05-25 NOTE — Discharge Summary (Signed)
Physician Discharge Summary Note  Patient:  Debra Sullivan is an 34 y.o., female MRN:  [id number] DOB:  1986/04/01 Patient phone:  [phone] (home)  Patient address:   Homeless In [place] [address],  Total Time spent with patient: 30 minutes  Date of Admission:  05/21/2020 Date of Discharge: 05/25/2020  Reason for Admission:  Patient is a 34 year old female with a past psychiatric history significant for polysubstance use disorders as well as reported bipolar disorder who originally presented to the [facility] emergency department on 05/19/2020 after having been found in the middle the street stating that she wanted to end things. Bystanders called 911. She was taken to the [facility] emergency department. During the evaluation and the emergency department she denied suicidal ideation and denied drug use. Unfortunately blood alcohol was 101, and drug screen was positive for cocaine, amphetamines and marijuana. It was noted by the assessment team that she was a poor historian. The only comment that she really made to the evaluator was that "I wanted to get to [facility]". The decision was made to admit her to the psychiatric facility. Upon arrival to our facility she is not a good historian and is irritable. Most of what is collected from her old record. Her last psychiatric hospitalization at our facility was on 03/27/2020. She had presented to the emergency department at that time with Covid symptoms, and then presented to another emergency room with vaginal bleeding. She refused a pelvic examination and refused blood draws at that time. A psychiatric consultation was obtained and she was transported to the [facility] at that time. It was found she had a previous history of bipolar disorder. Her drug screen at that time was positive for cannabis. Her previous hospitalizations revealed that she had been  discharged on Cogentin, Risperdal and trazodone. On the September hospitalization she denied having taken medications since her previous discharge. Review of the electronic medical record revealed that since 03/27/2020 she had had 5 emergency room visits. Of those 5 visit she had presented to the [facility] behavioral health urgent care center on at least 3 occasions. She has had at least 2 psychiatric hospitalizations at our facility. Her most recent was on 03/27/2020. Prior to that she had been hospitalized here in December 2019. It is highly suspected that she has been noncompliant with treatment.  Initially, patient was irritable but this has since subsided and she has been calm and cooperative. She has been taking her medications as prescribed without complaint of side effects. She has shown progressive improvement in her mental status and emotional status since her admission. She has indicated she would like to go to substance abuse rehab and was asking social work to help with this.  Today, she has decided that she will go home to her brothers house and she will look for a rehab. She is homeless and this is a large part of her stress. She denies suicidal and homicidal ideation, plan or intent. She denies auditory and visual hallucinations, paranoia and delusions. She feels the medication is helping her stay calm and she is hopeful to find a rehab.   Principal Problem: Bipolar 1 disorder [facility]) Discharge Diagnoses: Principal Problem:   Bipolar 1 disorder (HCC)   Past Psychiatric History: Bipolar 1 Disorder, Polysubstance abuse  Past Medical History:  Past Medical History:  Diagnosis Date  . Anemia   . Drug abuse (HCC)   . Psychosis (HCC) 2013  Past Surgical History:  Procedure Laterality Date  . CESAREAN SECTION  03/2016   pLTCS for twin B at [facility]  . LAPAROSCOPY N/A 04/14/2017   Procedure: LAPAROSCOPY OPERATIVE WITH RIGHT SALPINGECTOMY;  Surgeon: Conan Bowens, MD;   Location: [facility];  Service: Gynecology;  Laterality: N/A;  . TUBAL LIGATION Bilateral 01/22/2018   Procedure: POST PARTUM TUBAL LIGATION;  Surgeon: [name], DO;  Location: [facility];  Service: Gynecology;  Laterality: Bilateral;   Family History:  Family History  Problem Relation Age of Onset  . Diabetes Mother   . Schizophrenia Mother   . Diabetes Brother   . Hypertension Maternal Aunt   . Healthy Father    Family Psychiatric  History: See admission H&P Social History:  Social History   Substance and Sexual Activity  Alcohol Use Not Currently  . Alcohol/week: 0.0 standard drinks   Comment: socially     Social History   Substance and Sexual Activity  Drug Use Not Currently  . Types: Marijuana, Cocaine   Comment: Cocaine & Marijuana was used10/26/2018    Social History   Socioeconomic History  . Marital status: Single    Spouse name: Not on file  . Number of children: Not on file  . Years of education: Not on file  . Highest education level: Not on file  Occupational History  . Not on file  Tobacco Use  . Smoking status: Current Every Day Smoker    Packs/day: 0.25    Types: Cigarettes  . Smokeless tobacco: Never Used  Vaping Use  . Vaping Use: Never used  Substance and Sexual Activity  . Alcohol use: Not Currently    Alcohol/week: 0.0 standard drinks    Comment: socially  . Drug use: Not Currently    Types: Marijuana, Cocaine    Comment: Cocaine & Marijuana was used10/26/2018  . Sexual activity: Yes    Birth control/protection: None  Other Topics Concern  . Not on file  Social History Narrative  . Not on file   Social Determinants of Health   Financial Resource Strain:   . Difficulty of Paying Living Expenses: Not on file  Food Insecurity:   . Worried About Programme researcher, broadcasting/film/video in the Last Year: Not on file  . Ran Out of Food in the Last Year: Not on file  Transportation Needs:   . Lack of Transportation (Medical): Not on file  . Lack  of Transportation (Non-Medical): Not on file  Physical Activity:   . Days of Exercise per Week: Not on file  . Minutes of Exercise per Session: Not on file  Stress:   . Feeling of Stress : Not on file  Social Connections:   . Frequency of Communication with Friends and Family: Not on file  . Frequency of Social Gatherings with Friends and Family: Not on file  . Attends Religious Services: Not on file  . Active Member of Clubs or Organizations: Not on file  . Attends Banker Meetings: Not on file  . Marital Status: Not on file    Hospital Course:  Patient was irritable on admission but has progressed with medication management and stabilization. She has been attending group therapy and is interacting appropriately with staff and peers. She is homeless but will go home to her brother's house. She will look into a facility based substance abuse rehab form there. She is taking her medications and prescriptions were electronically submitted to the pharmacy on record. Patient is  calm and cooperative. Stable for discharge.   She remained on the [facility] unit for 4 days. She was started on Risperdal, Trazodone, and  Hydroxyzine. She participated in group therapy on the unit. She responded well to treatment with no adverse effects reported. She has shown improved mood, affect, sleep, and interaction. She denies any SI/HI/AVH and contracts for safety. She is discharging on the medications listed below. She agrees to follow up at an outpatient rehabilitation, of her choosing,  for substance abuse. She was provided with resources for substance abuse facilities. Patient is provided with prescriptions for medications upon discharge. Novi Calia is being picked up by [facility] for discharge to home.   Physical Findings: AIMS: Facial and Oral Movements Muscles of Facial Expression: None, normal Lips and Perioral Area: None, normal Jaw: None, normal Tongue: None, normal,Extremity  Movements Upper (arms, wrists, hands, fingers): None, normal Lower (legs, knees, ankles, toes): None, normal, Trunk Movements Neck, shoulders, hips: None, normal, Overall Severity Severity of abnormal movements (highest score from questions above): None, normal Incapacitation due to abnormal movements: None, normal Patient's awareness of abnormal movements (rate only patient's report): No Awareness, Dental Status Current problems with teeth and/or dentures?: No Does patient usually wear dentures?: No  CIWA:    COWS:     Musculoskeletal: Strength & Muscle Tone: within normal limits Gait & Station: normal Patient leans: N/A  Psychiatric Specialty Exam: Physical Exam Constitutional:      Appearance: Normal appearance.  HENT:     Head: Normocephalic.  Pulmonary:     Effort: Pulmonary effort is normal.  Musculoskeletal:        General: Normal range of motion.     Cervical back: Normal range of motion.  Skin:    General: Skin is warm and dry.  Neurological:     Mental Status: She is alert and oriented to person, place, and time.  Psychiatric:        Attention and Perception: Attention normal.        Mood and Affect: Mood normal.        Speech: Speech normal.        Behavior: Behavior normal. Behavior is cooperative.        Thought Content: Thought content normal.        Cognition and Memory: Cognition normal.     Review of Systems  Constitutional: Negative for activity change and appetite change.  Respiratory: Negative for chest tightness and shortness of breath.   Cardiovascular: Negative for chest pain.  Gastrointestinal: Negative for abdominal pain.  Neurological: Negative for facial asymmetry and headaches.   Blood pressure (!) 96/59, pulse (!) 120, temperature 97.6 F (36.4 C), temperature source Oral, resp. rate 18, height 5\' 4"  (1.626 m), weight 61.2 kg, SpO2 100 %.Body mass index is 23.17 kg/m.  General Appearance: Casual and Fairly Groomed  Eye Contact:  Good   Speech:  Clear and Coherent and Normal Rate  Volume:  Increased  Mood:  "ok"  Affect:  Appropriate and Constricted  Thought Process:  Coherent, Goal Directed and Linear  Orientation:  Full (Time, Place, and Person)  Thought Content:  WDL and Logical  Suicidal Thoughts:  No  Homicidal Thoughts:  No  Memory:  Immediate;   Good Recent;   Good Remote;   Good  Judgement:  Intact  Insight:  Good  Psychomotor Activity:  Normal  Concentration:  Concentration: Good and Attention Span: Good  Recall:  Good  Fund of Knowledge:  Good  Language:  Good  Akathisia:  Negative  Handed:  Right  AIMS (if indicated):     Assets:  Communication Skills Desire for Improvement Housing Physical Health Resilience Social Support  ADL's:  Intact  Cognition:  WNL  Sleep:  Number of Hours: 6.75     Have you used any form of tobacco in the last 30 days? (Cigarettes, Smokeless Tobacco, Cigars, and/or Pipes): Yes  Has this patient used any form of tobacco in the last 30 days? (Cigarettes, Smokeless Tobacco, Cigars, and/or Pipes) Yes, Yes, Prescription not provided because: patient declined  Blood Alcohol level:  Lab Results  Component Value Date   ETH 101 (H) 05/20/2020   ETH <10 05/08/2020    Metabolic Disorder Labs:  Lab Results  Component Value Date   HGBA1C 5.1 05/08/2020   MPG 99.67 05/08/2020   MPG 93.93 03/26/2020   Lab Results  Component Value Date   PROLACTIN 5.3 05/08/2020   PROLACTIN 43.5 (H) 04/16/2015   Lab Results  Component Value Date   CHOL 177 05/08/2020   TRIG 70 05/08/2020   HDL 69 05/08/2020   CHOLHDL 2.6 05/08/2020   VLDL 14 05/08/2020   LDLCALC 94 05/08/2020   LDLCALC 94 03/26/2020    See Psychiatric Specialty Exam and Suicide Risk Assessment completed by Attending Physician prior to discharge.  Discharge destination:  Home  Is patient on multiple antipsychotic therapies at discharge:  No   Has Patient had three or more failed trials of antipsychotic  monotherapy by history:  No  Recommended Plan for Multiple Antipsychotic Therapies: NA  Discharge Instructions    Diet - low sodium heart healthy   Complete by: As directed    Increase activity slowly   Complete by: As directed      Allergies as of 05/25/2020      Reactions   Naproxen Anaphylaxis      Medication List    STOP taking these medications   acetaminophen 325 MG tablet Commonly known as: TYLENOL     TAKE these medications     Indication  hydrOXYzine 25 MG tablet Commonly known as: ATARAX/VISTARIL Take 1 tablet (25 mg total) by mouth 3 (three) times daily as needed for anxiety.  Indication: Feeling Anxious   risperiDONE 2 MG disintegrating tablet Commonly known as: RISPERDAL M-TABS Take 1 tablet (2 mg total) by mouth at bedtime. What changed:   medication strength  how much to take  Indication: MIXED BIPOLAR AFFECTIVE DISORDER   risperiDONE 0.25 MG tablet Commonly known as: RISPERDAL Take 1 tablet (0.25 mg total) by mouth daily. Start taking on: May 26, 2020 What changed: You were already taking a medication with the same name, and this prescription was added. Make sure you understand how and when to take each.  Indication: MIXED BIPOLAR AFFECTIVE DISORDER   traZODone 50 MG tablet Commonly known as: DESYREL Take 1 tablet (50 mg total) by mouth at bedtime as needed for sleep.  Indication: Trouble Sleeping        Follow-up recommendations:  Activity:  as tolerated Diet:  Heart healthy-low sodium  Comments:  Patient is instructed prior to discharge to:  Take all medications as prescribed by his/her mental healthcare provider. Report any adverse effects and or reactions from the medicines to his/her outpatient provider promptly. Patient has been instructed & cautioned: To not engage in alcohol and or illegal drug use while on prescription medicines. In the event of worsening symptoms, patient is instructed to call the crisis hotline, 911 and or  go to the nearest ED for appropriate evaluation and treatment of symptoms. To follow-up with his/her primary care provider for your other medical issues, concerns and or health care needs.  Signed: Laveda Abbe, NP 05/25/2020, 1:23 PM

## 2020-05-25 NOTE — Progress Notes (Signed)
Patient  is cooperative with treatment she denies SI, HI and AVH. Patient was visible in the milieu. No behavioral issues to report on shift at this time.

## 2020-06-04 ENCOUNTER — Other Ambulatory Visit: Payer: Self-pay

## 2020-06-04 ENCOUNTER — Emergency Department (HOSPITAL_COMMUNITY)
Admission: EM | Admit: 2020-06-04 | Discharge: 2020-06-05 | Disposition: A | Discharge status: Other Acute Inpatient Hospital | Payer: Medicaid Other | Attending: Emergency Medicine | Admitting: Emergency Medicine

## 2020-06-04 DIAGNOSIS — Z5900 Homelessness unspecified: Secondary | ICD-10-CM | POA: Diagnosis not present

## 2020-06-04 DIAGNOSIS — R45851 Suicidal ideations: Secondary | ICD-10-CM | POA: Diagnosis not present

## 2020-06-04 DIAGNOSIS — F17210 Nicotine dependence, cigarettes, uncomplicated: Secondary | ICD-10-CM | POA: Diagnosis not present

## 2020-06-04 DIAGNOSIS — Z046 Encounter for general psychiatric examination, requested by authority: Secondary | ICD-10-CM | POA: Diagnosis present

## 2020-06-04 DIAGNOSIS — Z20822 Contact with and (suspected) exposure to covid-19: Secondary | ICD-10-CM | POA: Insufficient documentation

## 2020-06-04 DIAGNOSIS — F329 Major depressive disorder, single episode, unspecified: Secondary | ICD-10-CM | POA: Diagnosis not present

## 2020-06-04 LAB — COMPREHENSIVE METABOLIC PANEL
ALT: 28 U/L (ref 0–44)
AST: 27 U/L (ref 15–41)
Albumin: 3.4 g/dL — ABNORMAL LOW (ref 3.5–5.0)
Alkaline Phosphatase: 36 U/L — ABNORMAL LOW (ref 38–126)
Anion gap: 9 (ref 5–15)
BUN: 10 mg/dL (ref 6–20)
CO2: 23 mmol/L (ref 22–32)
Calcium: 8.9 mg/dL (ref 8.9–10.3)
Chloride: 108 mmol/L (ref 98–111)
Creatinine, Ser: 0.77 mg/dL (ref 0.44–1.00)
GFR, Estimated: >60 mL/min (ref >60)
Glucose, Bld: 94 mg/dL (ref 70–99)
Potassium: 3.5 mmol/L (ref 3.5–5.1)
Sodium: 140 mmol/L (ref 135–145)
Total Bilirubin: 0.8 mg/dL (ref 0.3–1.2)
Total Protein: 6.4 g/dL — ABNORMAL LOW (ref 6.5–8.1)

## 2020-06-04 LAB — ETHANOL: Alcohol, Ethyl (B): <10 mg/dL (ref <10)

## 2020-06-04 LAB — CBC
HCT: 32.5 % — ABNORMAL LOW (ref 36.0–46.0)
Hemoglobin: 10 g/dL — ABNORMAL LOW (ref 12.0–15.0)
MCH: 23.6 pg — ABNORMAL LOW (ref 26.0–34.0)
MCHC: 30.8 g/dL (ref 30.0–36.0)
MCV: 76.8 fL — ABNORMAL LOW (ref 80.0–100.0)
Platelets: 359 10*3/uL (ref 150–400)
RBC: 4.23 MIL/uL (ref 3.87–5.11)
RDW: 15.3 % (ref 11.5–15.5)
WBC: 6.4 10*3/uL (ref 4.0–10.5)
nRBC: 0 % (ref 0.0–0.2)

## 2020-06-04 LAB — SALICYLATE LEVEL: Salicylate Lvl: <7 mg/dL — ABNORMAL LOW (ref 7.0–30.0)

## 2020-06-04 LAB — I-STAT BETA HCG BLOOD, ED (MC, WL, AP ONLY): I-stat hCG, quantitative: <5 m[IU]/mL (ref <5)

## 2020-06-04 LAB — ACETAMINOPHEN LEVEL: Acetaminophen (Tylenol), Serum: <10 ug/mL — ABNORMAL LOW (ref 10–30)
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
## 2020-06-05 ENCOUNTER — Other Ambulatory Visit: Payer: Self-pay

## 2020-06-05 ENCOUNTER — Ambulatory Visit (HOSPITAL_COMMUNITY)
Admission: EM | Admit: 2020-06-05 | Discharge: 2020-06-06 | Disposition: A | Discharge status: Home / Self Care | Payer: Medicaid Other | Source: Home / Self Care

## 2020-06-05 DIAGNOSIS — F17210 Nicotine dependence, cigarettes, uncomplicated: Secondary | ICD-10-CM | POA: Insufficient documentation

## 2020-06-05 DIAGNOSIS — R45851 Suicidal ideations: Secondary | ICD-10-CM

## 2020-06-05 DIAGNOSIS — Z5900 Homelessness unspecified: Secondary | ICD-10-CM | POA: Insufficient documentation

## 2020-06-05 LAB — RESP PANEL BY RT-PCR (FLU A&B, COVID) ARPGX2
Influenza A by PCR: NEGATIVE
Influenza B by PCR: NEGATIVE
SARS Coronavirus 2 by RT PCR: NEGATIVE

## 2020-06-05 MED ORDER — TRAZODONE HCL 50 MG PO TABS: 50.0000 mg | ORAL_TABLET | Freq: Every evening | ORAL | Status: DC | PRN

## 2020-06-05 MED ORDER — CEFTRIAXONE SODIUM 500 MG IJ SOLR
500.0000 mg | Freq: Once | INTRAMUSCULAR | Status: AC
  Administered 2020-06-05: 02:00:00 500 mg via INTRAMUSCULAR
  Filled 2020-06-05: qty 500

## 2020-06-05 MED ORDER — RISPERIDONE 2 MG PO TBDP
2.0000 mg | ORAL_TABLET | Freq: Every day | ORAL | Status: DC
  Administered 2020-06-05: 21:00:00 2 mg via ORAL
  Filled 2020-06-05: qty 1

## 2020-06-05 MED ORDER — HYDROXYZINE HCL 25 MG PO TABS
25.0000 mg | ORAL_TABLET | Freq: Three times a day (TID) | ORAL | Status: DC | PRN
  Administered 2020-06-06: 11:00:00 25 mg via ORAL
  Filled 2020-06-05: qty 1

## 2020-06-05 MED ORDER — RISPERIDONE 0.25 MG PO TABS
0.2500 mg | ORAL_TABLET | Freq: Every day | ORAL | Status: DC
  Administered 2020-06-05 – 2020-06-06 (×2): 0.25 mg via ORAL
  Filled 2020-06-05 (×2): qty 1

## 2020-06-05 MED ORDER — AZITHROMYCIN 250 MG PO TABS
1000.0000 mg | ORAL_TABLET | Freq: Once | ORAL | Status: AC
  Administered 2020-06-05: 02:00:00 1000 mg via ORAL
  Filled 2020-06-05: qty 4

## 2020-06-05 MED ORDER — PROMETHAZINE HCL 25 MG PO TABS: 25.0000 mg | ORAL_TABLET | Freq: Four times a day (QID) | ORAL | Status: DC | PRN

## 2020-06-05 MED ORDER — ALUM & MAG HYDROXIDE-SIMETH 200-200-20 MG/5ML PO SUSP: 30.0000 mL | ORAL | Status: DC | PRN

## 2020-06-05 MED ORDER — LIDOCAINE HCL (PF) 1 % IJ SOLN
1.0000 mL | Freq: Once | INTRAMUSCULAR | Status: AC
  Administered 2020-06-05: 02:00:00 1 mL
  Filled 2020-06-05: qty 5

## 2020-06-05 MED ORDER — ACETAMINOPHEN 325 MG PO TABS: 650.0000 mg | ORAL_TABLET | Freq: Four times a day (QID) | ORAL | Status: DC | PRN

## 2020-06-05 MED ORDER — ULIPRISTAL ACETATE 30 MG PO TABS
30.0000 mg | ORAL_TABLET | Freq: Once | ORAL | Status: AC
  Administered 2020-06-05: 02:00:00 30 mg via ORAL
  Filled 2020-06-05: qty 1

## 2020-06-05 MED ORDER — MAGNESIUM HYDROXIDE 400 MG/5ML PO SUSP: 30.0000 mL | Freq: Every day | ORAL | Status: DC | PRN

## 2020-06-05 NOTE — ED Notes (Addendum)
34 yo admitted to continuous assessment due to SI w/plan to OD on RX medication. Pt states, "I'm homeless and Bipolar. I get sick and tired of people sending me to shelters. I just left [facility] hospital and was released from [facility]  On the 14th. They were suppose to find me somewhere to live. I'm not leaving here until you do. And I'm not going back to [facility]. So, y'all better do something. Y'all sent me home on Risperdal, Vistaril and Trazodone. You can't send people with Bipolar out on the streets with those meds. That's crazy". RN explained protocol and criteria for inpatient and outpatient stay but pt wasn't receptive to listening. Easily agitated. Denies drug use. Will monitor for safety.

## 2020-06-05 NOTE — BH Assessment (Signed)
Clinician spoke to [place], [place], pt to be place in a private room to complete TTS assessment. Clinician to call the cart in 5-10 mintues.    Redmond Pulling, MS, [facility], [facility] Triage Specialist [phone]

## 2020-06-05 NOTE — ED Notes (Signed)
Report given to Latricia at [facility].  

## 2020-06-05 NOTE — Progress Notes (Signed)
CSW spoke with Berneice Heinrich, FNP to discuss placement for patient.  CSW attempted to call patient's ACTT team, [facility]. CSW left 3 voicemails with the team and team lead.  CSW attempted to call [facility] homeless shelter with no success.  CSW contacted [facility] [phone]) and left a voicemail to call back leaving both numbers.    CSW will continue to look for housing placement for patient.     Stephannie Peters, MSW, LCSW Licensed Visual merchandiser - PRN (Transition of Care Team) [facility]

## 2020-06-05 NOTE — ED Notes (Signed)
Lunch given.

## 2020-06-05 NOTE — BH Assessment (Signed)
Clinician received a call from Delorise Shiner, RN at [facility] to express the pt COVID test is negative and will on the way to [facility]. Clinician gave RN nursing report's number.    Redmond Pulling, MS, [facility], [facility] Triage Specialist [phone]
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
## 2020-06-05 NOTE — ED Notes (Signed)
All belongings signed out by patient.

## 2020-06-05 NOTE — ED Notes (Signed)
Pt resting on pull out with eyes closed regular respirations in no acute distress. Will further assess with med administration.  
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
## 2020-06-05 NOTE — ED Notes (Signed)
Locker number [id number].  

## 2020-06-05 NOTE — ED Provider Notes (Signed)
Behavioral Health Admission H&P [facility] & OBS)  Date: 06/05/20 Patient Name: Debra Sullivan MRN: [id number] Chief Complaint:  Chief Complaint  Patient presents with  . Suicidal      Diagnoses:  Final diagnoses:  Suicidal ideation    HPI: Patient states "I came in for suicidal thoughts."  Patient reports "I am afraid I will backslide because I am homeless and I have been sleeping outside for a week."  Patient reports suicidal ideations x1 week.  Patient endorses plan to overdose on medications.  Patient denies intent to complete suicide.  Patient reports she would like help with housing or shelter options.  Patient contracts verbally for safety with this Clinical research associate.  Patient denies self-harm behaviors.  Patient reports current stressors include homelessness as well as concern for relapse on substance.  Patient reports until 1 week ago she resided with her brother however her brother has a 1 bedroom apartment and simply did not have the space for her to continue to be in his home.  Patient reports brother did pay for hotel room for 2 nights but he did not have money to continue to pay for hotel room.  Patient is currently homeless in [place].  Patient denies access to weapons.  Patient is currently not employed.  Patient endorses marijuana use, last use approximately 4 days ago.  Patient denies substance use aside from marijuana.  Patient denies current alcohol use, last use of alcohol 2 weeks ago.  Patient endorses average sleep and appetite.  Patient assessed by nurse practitioner.  Patient alert and oriented, answers appropriately.  Patient denies homicidal ideations.  Patient denies auditory and visual hallucinations.  There is no evidence of delusional thought content and no indication that patient is responding to internal stimuli.  Patient denies symptoms of paranoia.  Patient reports she is currently awaiting new act team placement.  Patient has been accepted to [facility]  [facility] and they will begin care for this patient is up in approximately 3 weeks time.  Patient reports she follows up with act team once weekly as directed by act team.  Patient reports feeling confident that act team will assist her in finding housing.  Patient reports compliance with current medications including risperidone 0.25 mg daily and 2 mg nightly.  Patient offered support and encouragement.   PHQ 2-9:  Flowsheet Row ED from 03/26/2020 in [facility] Office Visit from 05/07/2017 in [facility]  Thoughts that you would be better off dead, or of hurting yourself in some way Several days Not at all  PHQ-9 Total Score 19 0      Flowsheet Row ED from 06/05/2020 in [facility] ED from 06/04/2020 in [facility] EMERGENCY DEPARTMENT Admission (Discharged) [facility]  C-SSRS RISK CATEGORY High Risk High Risk Error: Q3, 4, or 5 should not be populated when Q2 is No       Total Time spent with patient: 30 minutes  Musculoskeletal  Strength & Muscle Tone: within normal limits Gait & Station: normal Patient leans: N/A  Psychiatric Specialty Exam  Presentation General Appearance: Appropriate for Environment; Casual  Eye Contact:Good  Speech:Clear and Coherent; Normal Rate  Speech Volume:Normal  Handedness:Right   Mood and Affect  Mood:Euthymic  Affect:Appropriate; Congruent   Thought Process  Thought Processes:Coherent; Goal Directed  Descriptions of Associations:Intact  Orientation:Full (Time, Place and Person)  Thought Content:Logical; WDL  Hallucinations:Hallucinations: None  Ideas of Reference:None  Suicidal Thoughts:Suicidal Thoughts: Yes, Passive SI Passive Intent and/or Plan: With Plan; Without Intent  Homicidal Thoughts:Homicidal Thoughts: No   Sensorium  Memory:Immediate Good; Recent Good; Remote  Good  Judgment:Fair  Insight:Fair   Executive Functions  Concentration:Good  Attention Span:Good  Recall:Good  Fund of Knowledge:Good  Language:Good   Psychomotor Activity  Psychomotor Activity:Psychomotor Activity: Normal   Assets  Assets:Communication Skills; Desire for Improvement; Intimacy; Leisure Time; Physical Health; Resilience; Social Support; Talents/Skills   Sleep  Sleep:Sleep: Fair   Physical Exam Vitals and nursing note reviewed.  Constitutional:      Appearance: She is well-developed.  HENT:     Head: Normocephalic.  Cardiovascular:     Rate and Rhythm: Normal rate.  Pulmonary:     Effort: Pulmonary effort is normal.  Neurological:     Mental Status: She is alert and oriented to person, place, and time.  Psychiatric:        Attention and Perception: Attention and perception normal.        Mood and Affect: Mood and affect normal.        Speech: Speech normal.        Behavior: Behavior normal. Behavior is cooperative.        Thought Content: Thought content includes suicidal ideation. Thought content includes suicidal plan.        Cognition and Memory: Cognition and memory normal.        Judgment: Judgment normal.    Review of Systems  Constitutional: Negative.   HENT: Negative.   Eyes: Negative.   Respiratory: Negative.   Cardiovascular: Negative.   Gastrointestinal: Negative.   Genitourinary: Negative.   Musculoskeletal: Negative.   Skin: Negative.   Neurological: Negative.   Endo/Heme/Allergies: Negative.     Blood pressure 112/72, temperature 97.7 F (36.5 C), temperature source Oral, resp. rate 18, height 5\' 4"  (1.626 m), weight 145 lb (65.8 kg), SpO2 100 %. Body mass index is 24.89 kg/m.  Past Psychiatric History: Bipolar 1 disorder, PTSD, major depressive disorder with psychosis, cocaine use disorder, cannabis use disorder, psychosis  Is the patient at risk to self? Yes  Has the patient been a risk to self in the past 6  months? Yes .    Has the patient been a risk to self within the distant past? Yes   Is the patient a risk to others? No   Has the patient been a risk to others in the past 6 months? No   Has the patient been a risk to others within the distant past? No   Past Medical History:  Past Medical History:  Diagnosis Date  . Anemia   . Drug abuse (HCC)   . Psychosis (HCC) 2013    Past Surgical History:  Procedure Laterality Date  . CESAREAN SECTION  03/2016   [facility]CS [facility] twin B at [facility]  . LAPAROSCOPY N/A 04/14/2017   Procedure: LAPAROSCOPY OPERATIVE WITH RIGHT SALPINGECTOMY;  Surgeon: Conan Bowens, MD;  Location: [facility];  Service: Gynecology;  Laterality: N/A;  . TUBAL LIGATION Bilateral 01/22/2018   Procedure: POST PARTUM TUBAL LIGATION;  Surgeon: [name], DO;  Location: [facility];  Service: Gynecology;  Laterality: Bilateral;    Family History:  Family History  Problem Relation Age of Onset  . Diabetes Mother   . Schizophrenia Mother   . Diabetes Brother   . Hypertension Maternal Aunt   . Healthy Father     Social History:  Social History  Socioeconomic History  . Marital status: Single    Spouse name: Not on file  . Number of children: Not on file  . Years of education: Not on file  . Highest education level: Not on file  Occupational History  . Not on file  Tobacco Use  . Smoking status: Current Every Day Smoker    Packs/day: 0.25    Types: Cigarettes  . Smokeless tobacco: Never Used  Vaping Use  . Vaping Use: Never used  Substance and Sexual Activity  . Alcohol use: Not Currently    Alcohol/week: 0.0 standard drinks    Comment: socially  . Drug use: Not Currently    Types: Marijuana, Cocaine    Comment: Cocaine & Marijuana was used10/26/2018  . Sexual activity: Yes    Birth control/protection: None  Other Topics Concern  . Not on file  Social History Narrative  . Not on file   Social Determinants of Health   Financial Resource  Strain: Not on file  Food Insecurity: Not on file  Transportation Needs: Not on file  Physical Activity: Not on file  Stress: Not on file  Social Connections: Not on file  Intimate Partner Violence: Not on file    SDOH:  SDOH Screenings   Alcohol Screen: Low Risk   . Last Alcohol Screening Score (AUDIT): 0  Depression (PHQ2-9): Medium Risk  . PHQ-2 Score: 19  Financial Resource Strain: Not on file  Food Insecurity: Not on file  Housing: Not on file  Physical Activity: Not on file  Social Connections: Not on file  Stress: Not on file  Tobacco Use: High Risk  . Smoking Tobacco Use: Current Every Day Smoker  . Smokeless Tobacco Use: Never Used  Transportation Needs: Not on file    Last Labs:  Admission on 06/04/2020, Discharged on 06/05/2020  Component Date Value Ref Range Status  . Sodium 06/04/2020 140  135 - 145 mmol/L Final  . Potassium 06/04/2020 3.5  3.5 - 5.1 mmol/L Final  . Chloride 06/04/2020 108  98 - 111 mmol/L Final  . CO2 06/04/2020 23  22 - 32 mmol/L Final  . Glucose, Bld 06/04/2020 94  70 - 99 mg/dL Final   Glucose reference range applies only to samples taken after fasting for at least 8 hours.  . BUN 06/04/2020 10  6 - 20 mg/dL Final  . Creatinine, Ser 06/04/2020 0.77  0.44 - 1.00 mg/dL Final  . Calcium 67/67/2094 8.9  8.9 - 10.3 mg/dL Final  . Total Protein 06/04/2020 6.4* 6.5 - 8.1 g/dL Final  . Albumin 70/96/2836 3.4* 3.5 - 5.0 g/dL Final  . AST 62/94/7654 27  15 - 41 U/L Final  . ALT 06/04/2020 28  0 - 44 U/L Final  . Alkaline Phosphatase 06/04/2020 36* 38 - 126 U/L Final  . Total Bilirubin 06/04/2020 0.8  0.3 - 1.2 mg/dL Final  . GFR, Estimated 06/04/2020 >60  >60 mL/min Final   Comment: (NOTE) Calculated using the CKD-EPI Creatinine Equation (2021)   . Anion gap 06/04/2020 9  5 - 15 Final   Performed at [facility], [address] [place], [place] [address]  . Alcohol, Ethyl (B) 06/04/2020 <10  <10 mg/dL Final   Comment: (NOTE) Lowest  detectable limit for serum alcohol is 10 mg/dL.  For medical purposes only. Performed at [facility], [address], [place], [place] [address]   . Salicylate Lvl 06/04/2020 <7.0* 7.0 - 30.0 mg/dL Final   Performed at  [facility], [address] [place] [place], [place] [address]  . Acetaminophen (Tylenol), Serum 06/04/2020 <10* 10 - 30 ug/mL Final   Comment: (NOTE) Therapeutic concentrations vary significantly. A range of 10-30 ug/mL  may be an effective concentration for many patients. However, some  are best treated at concentrations outside of this range. Acetaminophen concentrations >150 ug/mL at 4 hours after ingestion  and >50 ug/mL at 12 hours after ingestion are often associated with  toxic reactions.  Performed at [facility], [address] [place], [place] [address]   . WBC 06/04/2020 6.4  4.0 - 10.5 K/uL Final  . RBC 06/04/2020 4.23  3.87 - 5.11 MIL/uL Final  . Hemoglobin 06/04/2020 10.0* 12.0 - 15.0 g/dL Final  . HCT 09/81/1914 32.5* 36.0 - 46.0 % Final  . MCV 06/04/2020 76.8* 80.0 - 100.0 fL Final  . MCH 06/04/2020 23.6* 26.0 - 34.0 pg Final  . MCHC 06/04/2020 30.8  30.0 - 36.0 g/dL Final  . RDW 78/29/5621 15.3  11.5 - 15.5 % Final  . Platelets 06/04/2020 359  150 - 400 K/uL Final  . nRBC 06/04/2020 0.0  0.0 - 0.2 % Final   Performed at [facility], [address], [place], [place] [address]  . I-stat hCG, quantitative 06/04/2020 <5.0  <5 mIU/mL Final  . Comment 3 06/04/2020          Final   Comment:   GEST. AGE      CONC.  (mIU/mL)   <=1 WEEK        5 - 50     2 WEEKS       50 - 500     3 WEEKS       100 - 10,000     4 WEEKS     1,000 - 30,000        FEMALE AND NON-PREGNANT FEMALE:     LESS THAN 5 mIU/mL   . SARS Coronavirus 2 by RT PCR 06/05/2020 NEGATIVE  NEGATIVE Final   Comment: (NOTE) SARS-CoV-2 target nucleic acids are NOT DETECTED.  The SARS-CoV-2 RNA is generally detectable in upper respiratory specimens during the acute  phase of infection. The lowest concentration of SARS-CoV-2 viral copies this assay can detect is 138 copies/mL. A negative result does not preclude SARS-Cov-2 infection and should not be used as the sole basis for treatment or other patient management decisions. A negative result may occur with  improper specimen collection/handling, submission of specimen other than nasopharyngeal swab, presence of viral mutation(s) within the areas targeted by this assay, and inadequate number of viral copies(<138 copies/mL). A negative result must be combined with clinical observations, patient history, and epidemiological information. The expected result is Negative.  Fact Sheet for Patients:  BloggerCourse.com  Fact Sheet for Healthcare Providers:  SeriousBroker.it  This test is no                          t yet approved or cleared by the [place] FDA and  has been authorized for detection and/or diagnosis of SARS-CoV-2 by FDA under an Emergency Use Authorization (EUA). This EUA will remain  in effect (meaning this test can be used) for the duration of the COVID-19 declaration under Section 564(b)(1) of the Act, 21 U.S.C.section 360bbb-3(b)(1), unless the authorization is terminated  or revoked sooner.      . Influenza A by PCR 06/05/2020 NEGATIVE  NEGATIVE Final  . Influenza  B by PCR 06/05/2020 NEGATIVE  NEGATIVE Final   Comment: (NOTE) The Xpert Xpress SARS-CoV-2/FLU/RSV plus assay is intended as an aid in the diagnosis of influenza from Nasopharyngeal swab specimens and should not be used as a sole basis for treatment. Nasal washings and aspirates are unacceptable for Xpert Xpress SARS-CoV-2/FLU/RSV testing.  Fact Sheet for Patients: BloggerCourse.com  Fact Sheet for Healthcare Providers: SeriousBroker.it  This test is not yet approved or cleared by the [place] FDA and has  been authorized for detection and/or diagnosis of SARS-CoV-2 by FDA under an Emergency Use Authorization (EUA). This EUA will remain in effect (meaning this test can be used) for the duration of the COVID-19 declaration under Section 564(b)(1) of the Act, 21 U.S.C. section 360bbb-3(b)(1), unless the authorization is terminated or revoked.  Performed at [facility], [address], [place], [place] [address]   Admission on 05/21/2020, Discharged on 05/25/2020  Component Date Value Ref Range Status  . RPR Ser Ql 05/22/2020 NON REACTIVE  NON REACTIVE Final   Performed at [facility], [address], [place], [place] [address]  Admission on 05/19/2020, Discharged on 05/21/2020  Component Date Value Ref Range Status  . SARS Coronavirus 2 by RT PCR 05/20/2020 NEGATIVE  NEGATIVE Final   Comment: (NOTE) SARS-CoV-2 target nucleic acids are NOT DETECTED.  The SARS-CoV-2 RNA is generally detectable in upper respiratory specimens during the acute phase of infection. The lowest concentration of SARS-CoV-2 viral copies this assay can detect is 138 copies/mL. A negative result does not preclude SARS-Cov-2 infection and should not be used as the sole basis for treatment or other patient management decisions. A negative result may occur with  improper specimen collection/handling, submission of specimen other than nasopharyngeal swab, presence of viral mutation(s) within the areas targeted by this assay, and inadequate number of viral copies(<138 copies/mL). A negative result must be combined with clinical observations, patient history, and epidemiological information. The expected result is Negative.  Fact Sheet for Patients:  BloggerCourse.com  Fact Sheet for Healthcare Providers:  SeriousBroker.it  This test is no                          t yet approved or cleared by the [place] FDA and  has been authorized for detection  and/or diagnosis of SARS-CoV-2 by FDA under an Emergency Use Authorization (EUA). This EUA will remain  in effect (meaning this test can be used) for the duration of the COVID-19 declaration under Section 564(b)(1) of the Act, 21 U.S.C.section 360bbb-3(b)(1), unless the authorization is terminated  or revoked sooner.      . Influenza A by PCR 05/20/2020 NEGATIVE  NEGATIVE Final  . Influenza B by PCR 05/20/2020 NEGATIVE  NEGATIVE Final   Comment: (NOTE) The Xpert Xpress SARS-CoV-2/FLU/RSV plus assay is intended as an aid in the diagnosis of influenza from Nasopharyngeal swab specimens and should not be used as a sole basis for treatment. Nasal washings and aspirates are unacceptable for Xpert Xpress SARS-CoV-2/FLU/RSV testing.  Fact Sheet for Patients: BloggerCourse.com  Fact Sheet for Healthcare Providers: SeriousBroker.it  This test is not yet approved or cleared by the [place] FDA and has been authorized for detection and/or diagnosis of SARS-CoV-2 by FDA under an Emergency Use Authorization (EUA). This EUA will remain in effect (meaning this test can be used) for the duration of the COVID-19 declaration under Section 564(b)(1) of the Act, 21 U.S.C. section 360bbb-3(b)(1), unless the  authorization is terminated or revoked.  Performed at [facility], [address] [place], [place] [address]   . I-stat hCG, quantitative 05/20/2020 <5.0  <5 mIU/mL Final  . Comment 3 05/20/2020          Final   Comment:   GEST. AGE      CONC.  (mIU/mL)   <=1 WEEK        5 - 50     2 WEEKS       50 - 500     3 WEEKS       100 - 10,000     4 WEEKS     1,000 - 30,000        FEMALE AND NON-PREGNANT FEMALE:     LESS THAN 5 mIU/mL   . Sodium 05/20/2020 137  135 - 145 mmol/L Final  . Potassium 05/20/2020 2.8* 3.5 - 5.1 mmol/L Final  . Chloride 05/20/2020 107  98 - 111 mmol/L Final  . CO2 05/20/2020 15* 22 - 32 mmol/L Final  .  Glucose, Bld 05/20/2020 149* 70 - 99 mg/dL Final   Glucose reference range applies only to samples taken after fasting for at least 8 hours.  . BUN 05/20/2020 8  6 - 20 mg/dL Final  . Creatinine, Ser 05/20/2020 0.82  0.44 - 1.00 mg/dL Final  . Calcium 60/45/4098 8.7* 8.9 - 10.3 mg/dL Final  . Total Protein 05/20/2020 6.9  6.5 - 8.1 g/dL Final  . Albumin 11/91/4782 3.8  3.5 - 5.0 g/dL Final  . AST 95/62/1308 23  15 - 41 U/L Final  . ALT 05/20/2020 20  0 - 44 U/L Final  . Alkaline Phosphatase 05/20/2020 34* 38 - 126 U/L Final  . Total Bilirubin 05/20/2020 0.7  0.3 - 1.2 mg/dL Final  . GFR, Estimated 05/20/2020 >60  >60 mL/min Final   Comment: (NOTE) Calculated using the CKD-EPI Creatinine Equation (2021)   . Anion gap 05/20/2020 15  5 - 15 Final   Performed at [facility], [address] [place], [place] [address]  . Alcohol, Ethyl (B) 05/20/2020 101* <10 mg/dL Final   Comment: (NOTE) Lowest detectable limit for serum alcohol is 10 mg/dL.  For medical purposes only. Performed at [facility], [address] [place], [place] [address]   . Opiates 05/20/2020 NONE DETECTED  NONE DETECTED Final  . Cocaine 05/20/2020 POSITIVE* NONE DETECTED Final  . Benzodiazepines 05/20/2020 NONE DETECTED  NONE DETECTED Final  . Amphetamines 05/20/2020 POSITIVE* NONE DETECTED Final  . Tetrahydrocannabinol 05/20/2020 POSITIVE* NONE DETECTED Final  . Barbiturates 05/20/2020 NONE DETECTED  NONE DETECTED Final   Comment: (NOTE) DRUG SCREEN FOR MEDICAL PURPOSES ONLY.  IF CONFIRMATION IS NEEDED FOR ANY PURPOSE, NOTIFY LAB WITHIN 5 DAYS.  LOWEST DETECTABLE LIMITS FOR URINE DRUG SCREEN Drug Class                     Cutoff (ng/mL) Amphetamine and metabolites    1000 Barbiturate and metabolites    200 Benzodiazepine                 200 Tricyclics and metabolites     300 Opiates and metabolites        300 Cocaine and metabolites        300 THC                            50  Performed at  [facility], [address], [place], [place] [address]   . WBC 05/20/2020 9.0  4.0 - 10.5 K/uL Final  . RBC 05/20/2020 4.31  3.87 - 5.11 MIL/uL Final  . Hemoglobin 05/20/2020 10.4* 12.0 - 15.0 g/dL Final  . HCT 78/29/5621 33.8* 36.0 - 46.0 % Final  . MCV 05/20/2020 78.4* 80.0 - 100.0 fL Final  . MCH 05/20/2020 24.1* 26.0 - 34.0 pg Final  . MCHC 05/20/2020 30.8  30.0 - 36.0 g/dL Final  . RDW 30/86/5784 15.2  11.5 - 15.5 % Final  . Platelets 05/20/2020 304  150 - 400 K/uL Final  . nRBC 05/20/2020 0.0  0.0 - 0.2 % Final  . Neutrophils Relative % 05/20/2020 78  % Final  . Neutro Abs 05/20/2020 6.9  1.7 - 7.7 K/uL Final  . Lymphocytes Relative 05/20/2020 15  % Final  . Lymphs Abs 05/20/2020 1.4  0.7 - 4.0 K/uL Final  . Monocytes Relative 05/20/2020 7  % Final  . Monocytes Absolute 05/20/2020 0.6  0.1 - 1.0 K/uL Final  . Eosinophils Relative 05/20/2020 0  % Final  . Eosinophils Absolute 05/20/2020 0.0  0.0 - 0.5 K/uL Final  . Basophils Relative 05/20/2020 0  % Final  . Basophils Absolute 05/20/2020 0.0  0.0 - 0.1 K/uL Final  . Immature Granulocytes 05/20/2020 0  % Final  . Abs Immature Granulocytes 05/20/2020 0.03  0.00 - 0.07 K/uL Final   Performed at [facility], [address], [place], [place] [address]  . Sodium 05/20/2020 139  135 - 145 mmol/L Final  . Potassium 05/20/2020 2.8* 3.5 - 5.1 mmol/L Final  . Chloride 05/20/2020 107  98 - 111 mmol/L Final  . BUN 05/20/2020 10  6 - 20 mg/dL Final  . Creatinine, Ser 05/20/2020 0.90  0.44 - 1.00 mg/dL Final  . Glucose, Bld 52/84/1324 141* 70 - 99 mg/dL Final   Glucose reference range applies only to samples taken after fasting for at least 8 hours.  . Calcium, Ion 05/20/2020 1.10* 1.15 - 1.40 mmol/L Final  . TCO2 05/20/2020 17* 22 - 32 mmol/L Final  . Hemoglobin 05/20/2020 12.2  12.0 - 15.0 g/dL Final  . HCT 40/03/2724 36.0  36.0 - 46.0 % Final  . Salicylate Lvl 05/20/2020 <7.0* 7.0 - 30.0 mg/dL Final   Performed at [facility], [address] [place], [place] [address]  . Acetaminophen (Tylenol), Serum 05/20/2020 <10* 10 - 30 ug/mL Final   Comment: (NOTE) Therapeutic concentrations vary significantly. A range of 10-30 ug/mL  may be an effective concentration for many patients. However, some  are best treated at concentrations outside of this range. Acetaminophen concentrations >150 ug/mL at 4 hours after ingestion  and >50 ug/mL at 12 hours after ingestion are often associated with  toxic reactions.  Performed at [facility], [address] [place], [place] [address]   . Magnesium 05/20/2020 1.6* 1.7 - 2.4 mg/dL Final   Performed at [facility], [address], [place], [place] [address]  Admission on 05/09/2020, Discharged on 05/10/2020  Component Date Value Ref Range Status  . SARS Coronavirus 2 Ag 05/09/2020 Negative  Negative Preliminary  . SARS Coronavirus 2 by RT PCR 05/09/2020 NEGATIVE  NEGATIVE Final   Comment: (NOTE) SARS-CoV-2 target nucleic acids are NOT DETECTED.  The SARS-CoV-2 RNA is generally detectable in upper respiratoy specimens during the acute phase of infection. The lowest concentration of SARS-CoV-2 viral  copies this assay can detect is 131 copies/mL. A negative result does not preclude SARS-Cov-2 infection and should not be used as the sole basis for treatment or other patient management decisions. A negative result may occur with  improper specimen collection/handling, submission of specimen other than nasopharyngeal swab, presence of viral mutation(s) within the areas targeted by this assay, and inadequate number of viral copies (<131 copies/mL). A negative result must be combined with clinical observations, patient history, and epidemiological information. The expected result is Negative.  Fact Sheet for Patients:  https://www.moore.com/  Fact Sheet for Healthcare Providers:  https://www.young.biz/  This  test is no                          t yet approved or cleared by the [place] FDA and  has been authorized for detection and/or diagnosis of SARS-CoV-2 by FDA under an Emergency Use Authorization (EUA). This EUA will remain  in effect (meaning this test can be used) for the duration of the COVID-19 declaration under Section 564(b)(1) of the Act, 21 U.S.C. section 360bbb-3(b)(1), unless the authorization is terminated or revoked sooner.    . Influenza A by PCR 05/09/2020 NEGATIVE  NEGATIVE Final  . Influenza B by PCR 05/09/2020 NEGATIVE  NEGATIVE Final   Comment: (NOTE) The Xpert Xpress SARS-CoV-2/FLU/RSV assay is intended as an aid in  the diagnosis of influenza from Nasopharyngeal swab specimens and  should not be used as a sole basis for treatment. Nasal washings and  aspirates are unacceptable for Xpert Xpress SARS-CoV-2/FLU/RSV  testing.  Fact Sheet for Patients: https://www.moore.com/  Fact Sheet for Healthcare Providers: https://www.young.biz/  This test is not yet approved or cleared by the [place] FDA and  has been authorized for detection and/or diagnosis of SARS-CoV-2 by  FDA under an Emergency Use Authorization (EUA). This EUA will remain  in effect (meaning this test can be used) for the duration of the  Covid-19 declaration under Section 564(b)(1) of the Act, 21  U.S.C. section 360bbb-3(b)(1), unless the authorization is  terminated or revoked. Performed at [facility], [address], [place], [place] [address]   Admission on 05/08/2020, Discharged on 05/08/2020  Component Date Value Ref Range Status  . SARS Coronavirus 2 Ag 05/08/2020 Negative  Negative Preliminary  . WBC 05/08/2020 10.6* 4.0 - 10.5 K/uL Final  . RBC 05/08/2020 5.02  3.87 - 5.11 MIL/uL Final  . Hemoglobin 05/08/2020 11.8* 12.0 - 15.0 g/dL Final  . HCT 60/45/4098 38.3  36.0 - 46.0 % Final  . MCV 05/08/2020 76.3* 80.0 - 100.0 fL Final  .  MCH 05/08/2020 23.5* 26.0 - 34.0 pg Final  . MCHC 05/08/2020 30.8  30.0 - 36.0 g/dL Final  . RDW 11/91/4782 14.8  11.5 - 15.5 % Final  . Platelets 05/08/2020 432* 150 - 400 K/uL Final  . nRBC 05/08/2020 0.0  0.0 - 0.2 % Final  . Neutrophils Relative % 05/08/2020 74  % Final  . Neutro Abs 05/08/2020 7.8* 1.7 - 7.7 K/uL Final  . Lymphocytes Relative 05/08/2020 18  % Final  . Lymphs Abs 05/08/2020 1.9  0.7 - 4.0 K/uL Final  . Monocytes Relative 05/08/2020 4  % Final  . Monocytes Absolute 05/08/2020 0.5  0.1 - 1.0 K/uL Final  . Eosinophils Relative 05/08/2020 3  % Final  . Eosinophils Absolute 05/08/2020 0.3  0.0 - 0.5 K/uL Final  . Basophils Relative 05/08/2020 1  % Final  .  Basophils Absolute 05/08/2020 0.1  0.0 - 0.1 K/uL Final  . Immature Granulocytes 05/08/2020 0  % Final  . Abs Immature Granulocytes 05/08/2020 0.04  0.00 - 0.07 K/uL Final   Performed at [facility], [address] [place], [place] [address]  . Sodium 05/08/2020 139  135 - 145 mmol/L Final  . Potassium 05/08/2020 3.4* 3.5 - 5.1 mmol/L Final  . Chloride 05/08/2020 104  98 - 111 mmol/L Final  . CO2 05/08/2020 25  22 - 32 mmol/L Final  . Glucose, Bld 05/08/2020 87  70 - 99 mg/dL Final   Glucose reference range applies only to samples taken after fasting for at least 8 hours.  . BUN 05/08/2020 9  6 - 20 mg/dL Final  . Creatinine, Ser 05/08/2020 0.91  0.44 - 1.00 mg/dL Final  . Calcium 57/84/6962 9.7  8.9 - 10.3 mg/dL Final  . Total Protein 05/08/2020 7.8  6.5 - 8.1 g/dL Final  . Albumin 95/28/4132 4.3  3.5 - 5.0 g/dL Final  . AST 44/06/270 20  15 - 41 U/L Final  . ALT 05/08/2020 20  0 - 44 U/L Final  . Alkaline Phosphatase 05/08/2020 39  38 - 126 U/L Final  . Total Bilirubin 05/08/2020 1.1  0.3 - 1.2 mg/dL Final  . GFR, Estimated 05/08/2020 >60  >60 mL/min Final   Comment: (NOTE) Calculated using the CKD-EPI Creatinine Equation (2021)   . Anion gap 05/08/2020 10  5 - 15 Final   Performed at [facility], [address] [place], [place] [address]  . Hgb A1c MFr Bld 05/08/2020 5.1  4.8 - 5.6 % Final   Comment: (NOTE) Pre diabetes:          5.7%-6.4%  Diabetes:              >6.4%  Glycemic control for   <7.0% adults with diabetes   . Mean Plasma Glucose 05/08/2020 99.67  mg/dL Final   Performed at [facility], [address] [place], [place] [address]  . Magnesium 05/08/2020 2.2  1.7 - 2.4 mg/dL Final   Performed at [facility], [address] [place], [place] [address]  . Alcohol, Ethyl (B) 05/08/2020 <10  <10 mg/dL Final   Comment: (NOTE) Lowest detectable limit for serum alcohol is 10 mg/dL.  For medical purposes only. Performed at [facility], [address] [place], [place] [address]   . Cholesterol 05/08/2020 177  0 - 200 mg/dL Final  . Triglycerides 05/08/2020 70  <150 mg/dL Final  . HDL 64/33/2951 69  >40 mg/dL Final  . Total CHOL/HDL Ratio 05/08/2020 2.6  RATIO Final  . VLDL 05/08/2020 14  0 - 40 mg/dL Final  . LDL Cholesterol 05/08/2020 94  0 - 99 mg/dL Final   Comment:        Total Cholesterol/HDL:CHD Risk Coronary Heart Disease Risk Table                     Men   Women  1/2 Average Risk   3.4   3.3  Average Risk       5.0   4.4  2 X Average Risk   9.6   7.1  3 X Average Risk  23.4   11.0        Use the calculated Patient Ratio above and the CHD Risk Table to determine the patient's CHD Risk.  ATP III CLASSIFICATION (LDL):  <100     mg/dL   Optimal  914-782  mg/dL   Near or Above                    Optimal  130-159  mg/dL   Borderline  956-213  mg/dL   High  >086     mg/dL   Very High Performed at [facility], [address] [place], [place] [address]   . TSH 05/08/2020 2.540  0.350 - 4.500 uIU/mL Final   Comment: Performed by a 3rd Generation assay with a functional sensitivity of <=0.01 uIU/mL. Performed at [facility], [address] [place], [place] [address]   . Prolactin 05/08/2020 5.3  4.8 -  23.3 ng/mL Final   Comment: (NOTE) Performed At: [facility] [address] [place], [place] [id number] Jolene Schimke MD NU:[id number]   . POC Amphetamine UR 05/08/2020 None Detected  None Detected Final  . POC Secobarbital (BAR) 05/08/2020 None Detected  None Detected Final  . POC Buprenorphine (BUP) 05/08/2020 None Detected  None Detected Final  . POC Oxazepam (BZO) 05/08/2020 None Detected  None Detected Final  . POC Cocaine UR 05/08/2020 Positive* None Detected Final  . POC Methamphetamine UR 05/08/2020 None Detected  None Detected Final  . POC Morphine 05/08/2020 None Detected  None Detected Final  . POC Oxycodone UR 05/08/2020 None Detected  None Detected Final  . POC Methadone UR 05/08/2020 None Detected  None Detected Final  . POC Marijuana UR 05/08/2020 None Detected  None Detected Final  . Glucose-Capillary 05/08/2020 93  70 - 99 mg/dL Final   Glucose reference range applies only to samples taken after fasting for at least 8 hours.  . Preg Test, Ur 05/08/2020 NEGATIVE  NEGATIVE Final   Comment:        THE SENSITIVITY OF THIS METHODOLOGY IS >24 mIU/mL   Admission on 03/27/2020, Discharged on 03/29/2020  Component Date Value Ref Range Status  . Opiates 03/28/2020 NONE DETECTED  NONE DETECTED Final  . Cocaine 03/28/2020 POSITIVE* NONE DETECTED Final  . Benzodiazepines 03/28/2020 NONE DETECTED  NONE DETECTED Final  . Amphetamines 03/28/2020 NONE DETECTED  NONE DETECTED Final  . Tetrahydrocannabinol 03/28/2020 POSITIVE* NONE DETECTED Final  . Barbiturates 03/28/2020 NONE DETECTED  NONE DETECTED Final   Comment: (NOTE) DRUG SCREEN FOR MEDICAL PURPOSES ONLY.  IF CONFIRMATION IS NEEDED FOR ANY PURPOSE, NOTIFY LAB WITHIN 5 DAYS.  LOWEST DETECTABLE LIMITS FOR URINE DRUG SCREEN Drug Class                     Cutoff (ng/mL) Amphetamine and metabolites    1000 Barbiturate and metabolites    200 Benzodiazepine                 200 Tricyclics and metabolites      300 Opiates and metabolites        300 Cocaine and metabolites        300 THC                            50 Performed at [facility], [address] [place], [place] [address]   Admission on 03/26/2020, Discharged on 03/27/2020  Component Date Value Ref Range Status  . SARS Coronavirus 2 by RT PCR 03/26/2020 NEGATIVE  NEGATIVE Final   Comment: (NOTE) SARS-CoV-2 target nucleic acids are NOT DETECTED.  The  SARS-CoV-2 RNA is generally detectable in upper respiratoy specimens during the acute phase of infection. The lowest concentration of SARS-CoV-2 viral copies this assay can detect is 131 copies/mL. A negative result does not preclude SARS-Cov-2 infection and should not be used as the sole basis for treatment or other patient management decisions. A negative result may occur with  improper specimen collection/handling, submission of specimen other than nasopharyngeal swab, presence of viral mutation(s) within the areas targeted by this assay, and inadequate number of viral copies (<131 copies/mL). A negative result must be combined with clinical observations, patient history, and epidemiological information. The expected result is Negative.  Fact Sheet for Patients:  https://www.moore.com/  Fact Sheet for Healthcare Providers:  https://www.young.biz/  This test is no                          t yet approved or cleared by the [place] FDA and  has been authorized for detection and/or diagnosis of SARS-CoV-2 by FDA under an Emergency Use Authorization (EUA). This EUA will remain  in effect (meaning this test can be used) for the duration of the COVID-19 declaration under Section 564(b)(1) of the Act, 21 U.S.C. section 360bbb-3(b)(1), unless the authorization is terminated or revoked sooner.    . Influenza A by PCR 03/26/2020 NEGATIVE  NEGATIVE Final  . Influenza B by PCR 03/26/2020 NEGATIVE  NEGATIVE Final    Comment: (NOTE) The Xpert Xpress SARS-CoV-2/FLU/RSV assay is intended as an aid in  the diagnosis of influenza from Nasopharyngeal swab specimens and  should not be used as a sole basis for treatment. Nasal washings and  aspirates are unacceptable for Xpert Xpress SARS-CoV-2/FLU/RSV  testing.  Fact Sheet for Patients: https://www.moore.com/  Fact Sheet for Healthcare Providers: https://www.young.biz/  This test is not yet approved or cleared by the [place] FDA and  has been authorized for detection and/or diagnosis of SARS-CoV-2 by  FDA under an Emergency Use Authorization (EUA). This EUA will remain  in effect (meaning this test can be used) for the duration of the  Covid-19 declaration under Section 564(b)(1) of the Act, 21  U.S.C. section 360bbb-3(b)(1), unless the authorization is  terminated or revoked. Performed at [facility], [address] [place], [place] [address]   . SARS Coronavirus 2 Ag 03/26/2020 Negative  Negative Preliminary  . WBC 03/26/2020 7.4  4.0 - 10.5 K/uL Final  . RBC 03/26/2020 4.54  3.87 - 5.11 MIL/uL Final  . Hemoglobin 03/26/2020 10.7* 12.0 - 15.0 g/dL Final  . HCT 60/45/4098 34.2* 36.0 - 46.0 % Final  . MCV 03/26/2020 75.3* 80.0 - 100.0 fL Final  . MCH 03/26/2020 23.6* 26.0 - 34.0 pg Final  . MCHC 03/26/2020 31.3  30.0 - 36.0 g/dL Final  . RDW 11/91/4782 15.2  11.5 - 15.5 % Final  . Platelets 03/26/2020 313  150 - 400 K/uL Final  . nRBC 03/26/2020 0.0  0.0 - 0.2 % Final  . Neutrophils Relative % 03/26/2020 44  % Final  . Neutro Abs 03/26/2020 3.3  1.7 - 7.7 K/uL Final  . Lymphocytes Relative 03/26/2020 43  % Final  . Lymphs Abs 03/26/2020 3.2  0.7 - 4.0 K/uL Final  . Monocytes Relative 03/26/2020 8  % Final  . Monocytes Absolute 03/26/2020 0.6  0.1 - 1.0 K/uL Final  . Eosinophils Relative 03/26/2020 4  % Final  . Eosinophils Absolute 03/26/2020 0.3  0.0 - 0.5 K/uL Final  . Basophils  Relative  03/26/2020 1  % Final  . Basophils Absolute 03/26/2020 0.0  0.0 - 0.1 K/uL Final  . Immature Granulocytes 03/26/2020 0  % Final  . Abs Immature Granulocytes 03/26/2020 0.02  0.00 - 0.07 K/uL Final   Performed at [facility], [address], [place], [place] [address]  . Sodium 03/26/2020 138  135 - 145 mmol/L Final  . Potassium 03/26/2020 3.2* 3.5 - 5.1 mmol/L Final  . Chloride 03/26/2020 106  98 - 111 mmol/L Final  . CO2 03/26/2020 22  22 - 32 mmol/L Final  . Glucose, Bld 03/26/2020 84  70 - 99 mg/dL Final   Glucose reference range applies only to samples taken after fasting for at least 8 hours.  . BUN 03/26/2020 8  6 - 20 mg/dL Final  . Creatinine, Ser 03/26/2020 0.87  0.44 - 1.00 mg/dL Final  . Calcium 17/00/1749 8.9  8.9 - 10.3 mg/dL Final  . Total Protein 03/26/2020 7.0  6.5 - 8.1 g/dL Final  . Albumin 44/96/7591 3.9  3.5 - 5.0 g/dL Final  . AST 63/84/6659 16  15 - 41 U/L Final  . ALT 03/26/2020 14  0 - 44 U/L Final  . Alkaline Phosphatase 03/26/2020 38  38 - 126 U/L Final  . Total Bilirubin 03/26/2020 1.1  0.3 - 1.2 mg/dL Final  . GFR, Estimated 03/26/2020 >60  >60 mL/min Final  . Anion gap 03/26/2020 10  5 - 15 Final   Performed at [facility], [address], [place], [place] [address]  . Hgb A1c MFr Bld 03/26/2020 4.9  4.8 - 5.6 % Final   Comment: (NOTE) Pre diabetes:          5.7%-6.4%  Diabetes:              >6.4%  Glycemic control for   <7.0% adults with diabetes   . Mean Plasma Glucose 03/26/2020 93.93  mg/dL Final   Performed at [facility], [address], [place], [place] [address]  . Cholesterol 03/26/2020 172  0 - 200 mg/dL Final  . Triglycerides 03/26/2020 55  <150 mg/dL Final  . HDL 90/30/0923 67  >40 mg/dL Final  . Total CHOL/HDL Ratio 03/26/2020 2.6  RATIO Final  . VLDL 03/26/2020 11  0 - 40 mg/dL Final  . LDL Cholesterol 03/26/2020 94  0 - 99 mg/dL Final   Comment:        Total Cholesterol/HDL:CHD Risk Coronary Heart Disease Risk  Table                     Men   Women  1/2 Average Risk   3.4   3.3  Average Risk       5.0   4.4  2 X Average Risk   9.6   7.1  3 X Average Risk  23.4   11.0        Use the calculated Patient Ratio above and the CHD Risk Table to determine the patient's CHD Risk.        ATP III CLASSIFICATION (LDL):  <100     mg/dL   Optimal  300-762  mg/dL   Near or Above                    Optimal  130-159  mg/dL   Borderline  263-335  mg/dL   High  >456     mg/dL   Very High Performed at [facility], [address] [place], [place] [address]   . TSH 03/26/2020 1.998  0.350 - 4.500 uIU/mL Final   Comment: Performed by a 3rd Generation assay with a functional sensitivity of <=0.01 uIU/mL. Performed at [facility], [address] [place], [place] [address]   . SARS Coronavirus 2 Ag 03/26/2020 NEGATIVE  NEGATIVE Final   Comment: (NOTE) SARS-CoV-2 antigen NOT DETECTED.   Negative results are presumptive.  Negative results do not preclude SARS-CoV-2 infection and should not be used as the sole basis for treatment or other patient management decisions, including infection  control decisions, particularly in the presence of clinical signs and  symptoms consistent with COVID-19, or in those who have been in contact with the virus.  Negative results must be combined with clinical observations, patient history, and epidemiological information. The expected result is Negative.  Fact Sheet for Patients: https://sanders-williams.net/https://www.fda.gov/media/139754/download  Fact Sheet for Healthcare Providers: https://martinez.com/https://www.fda.gov/media/139753/download   This test is not yet approved or cleared by the [place] FDA and  has been authorized for detection and/or diagnosis of SARS-CoV-2 by FDA under an Emergency Use Authorization (EUA).  This EUA will remain in effect (meaning this test can be used) for the duration of  the C                          OVID-19 declaration under Section 564(b)(1) of the Act,  21 U.S.C. section 360bbb-3(b)(1), unless the authorization is terminated or revoked sooner.    . Preg Test, Ur 03/27/2020 NEGATIVE  NEGATIVE Final   Comment:        THE SENSITIVITY OF THIS METHODOLOGY IS >24 mIU/mL   . Glucose, UA 03/27/2020 NEGATIVE  NEGATIVE mg/dL Final  . Bilirubin Urine 03/27/2020 SMALL* NEGATIVE Final  . Ketones, ur 03/27/2020 15* NEGATIVE mg/dL Final  . Specific Gravity, Urine 03/27/2020 >=1.030  1.005 - 1.030 Final  . Hgb urine dipstick 03/27/2020 LARGE* NEGATIVE Final  . pH 03/27/2020 6.5  5.0 - 8.0 Final  . Protein, ur 03/27/2020 100* NEGATIVE mg/dL Final  . Urobilinogen, UA 03/27/2020 2.0* 0.0 - 1.0 mg/dL Final  . Nitrite 09/81/191410/02/2020 NEGATIVE  NEGATIVE Final  . Glori LuisLeukocytes,Ua 03/27/2020 NEGATIVE  NEGATIVE Final   Biochemical Testing Only. Please order routine urinalysis from main lab if confirmatory testing is needed.  Admission on 03/26/2020, Discharged on 03/26/2020  Component Date Value Ref Range Status  . Preg Test, Ur 03/26/2020 NEGATIVE  NEGATIVE Final   Comment:        THE SENSITIVITY OF THIS METHODOLOGY IS >24 mIU/mL   Admission on 01/23/2020, Discharged on 01/23/2020  Component Date Value Ref Range Status  . Color, UA 01/23/2020 yellow  yellow Final  . Clarity, UA 01/23/2020 clear  clear Final  . Glucose, UA 01/23/2020 negative  negative mg/dL Final  . Bilirubin, UA 01/23/2020 negative  negative Final  . Ketones, POC UA 01/23/2020 negative  negative mg/dL Final  . Spec Grav, UA 01/23/2020 >=1.030* 1.010 - 1.025 Final  . Blood, UA 01/23/2020 negative  negative Final  . pH, UA 01/23/2020 6.5  5.0 - 8.0 Final  . Protein Ur, POC 01/23/2020 negative  negative mg/dL Final  . Urobilinogen, UA 01/23/2020 1.0  0.2 or 1.0 E.U./dL Final  . Nitrite, UA 78/29/562108/11/2019 Negative  Negative Final  . Leukocytes, UA 01/23/2020 Negative  Negative Final  . Preg Test, Ur 01/23/2020 Negative  Negative Final  . Trichomonas 01/23/2020 Negative   Final  .  Chlamydia  01/23/2020 Negative   Final  . Neisseria Gonorrhea 01/23/2020 Negative   Final  . Comment 01/23/2020 Normal Reference Ranger Chlamydia - Negative   Final  . Comment 01/23/2020 Normal Reference Range Trichomonas - Negative   Final  . Comment 01/23/2020 Normal Reference Range Neisseria Gonorrhea - Negative   Final  Admission on 01/19/2020, Discharged on 01/19/2020  Component Date Value Ref Range Status  . Lipase 01/19/2020 30  11 - 51 U/L Final   Performed at [facility], [address], [place], [place] [address]  . Sodium 01/19/2020 137  135 - 145 mmol/L Final  . Potassium 01/19/2020 3.5  3.5 - 5.1 mmol/L Final  . Chloride 01/19/2020 108  98 - 111 mmol/L Final  . CO2 01/19/2020 22  22 - 32 mmol/L Final  . Glucose, Bld 01/19/2020 92  70 - 99 mg/dL Final   Glucose reference range applies only to samples taken after fasting for at least 8 hours.  . BUN 01/19/2020 8  6 - 20 mg/dL Final  . Creatinine, Ser 01/19/2020 0.78  0.44 - 1.00 mg/dL Final  . Calcium 60/45/4098 9.1  8.9 - 10.3 mg/dL Final  . Total Protein 01/19/2020 7.2  6.5 - 8.1 g/dL Final  . Albumin 11/91/4782 3.7  3.5 - 5.0 g/dL Final  . AST 95/62/1308 18  15 - 41 U/L Final  . ALT 01/19/2020 19  0 - 44 U/L Final  . Alkaline Phosphatase 01/19/2020 38  38 - 126 U/L Final  . Total Bilirubin 01/19/2020 1.2  0.3 - 1.2 mg/dL Final  . GFR calc non Af Amer 01/19/2020 >60  >60 mL/min Final  . GFR calc Af Amer 01/19/2020 >60  >60 mL/min Final  . Anion gap 01/19/2020 7  5 - 15 Final   Performed at [facility], [address] [place], [place] [address]  . WBC 01/19/2020 10.2  4.0 - 10.5 K/uL Final  . RBC 01/19/2020 4.70  3.87 - 5.11 MIL/uL Final  . Hemoglobin 01/19/2020 10.8* 12.0 - 15.0 g/dL Final  . HCT 69/62/9528 34.9* 36.0 - 46.0 % Final  . MCV 01/19/2020 74.3* 80.0 - 100.0 fL Final  . MCH 01/19/2020 23.0* 26.0 - 34.0 pg Final  . MCHC 01/19/2020 30.9  30.0 - 36.0 g/dL Final  . RDW 41/32/4401 15.4  11.5 - 15.5 % Final   . Platelets 01/19/2020 419* 150 - 400 K/uL Final  . nRBC 01/19/2020 0.0  0.0 - 0.2 % Final   Performed at [facility], [address], [place], [place] [address]  . Color, Urine 01/19/2020 YELLOW  YELLOW Final  . APPearance 01/19/2020 CLEAR  CLEAR Final  . Specific Gravity, Urine 01/19/2020 1.016  1.005 - 1.030 Final  . pH 01/19/2020 5.0  5.0 - 8.0 Final  . Glucose, UA 01/19/2020 NEGATIVE  NEGATIVE mg/dL Final  . Hgb urine dipstick 01/19/2020 NEGATIVE  NEGATIVE Final  . Bilirubin Urine 01/19/2020 NEGATIVE  NEGATIVE Final  . Ketones, ur 01/19/2020 5* NEGATIVE mg/dL Final  . Protein, ur 36/64/4034 NEGATIVE  NEGATIVE mg/dL Final  . Nitrite 74/25/9563 NEGATIVE  NEGATIVE Final  . Glori Luis 01/19/2020 NEGATIVE  NEGATIVE Final   Performed at [facility], [address] [place], [place] [address]  . I-stat hCG, quantitative 01/19/2020 <5.0  <5 mIU/mL Final  . Comment 3 01/19/2020          Final   Comment:   GEST. AGE  CONC.  (mIU/mL)   <=1 WEEK        5 - 50     2 WEEKS       50 - 500     3 WEEKS       100 - 10,000     4 WEEKS     1,000 - 30,000        FEMALE AND NON-PREGNANT FEMALE:     LESS THAN 5 mIU/mL   There may be more visits with results that are not included.    Allergies: Naproxen  PTA Medications: (Not in a hospital admission)   Medical Decision Making  Discussed overnight observation as well as restart home medications, patient verbalizes agreement with plan.  Medications: -Risperidone 2 mg nightly -Risperidone 0.25 mg daily -Trazodone 50 mg nightly as needed/sleep -Vistaril 25 mg 3 times daily as needed/anxiety    Recommendations  Based on my evaluation the patient does not appear to have an emergency medical condition.  Care reviewed with Dr. Nelly Rout.  Patient will be placed in the continuous assessment area at [facility] for treatment and stabilization.  Patient will be reevaluated on 06/06/2020, treatment team will determine disposition  at that time.  Patrcia Dolly, FNP 06/05/20  12:44 PM

## 2020-06-05 NOTE — ED Notes (Signed)
Pt asleep with even and unlabored respirations. No distress or discomfort noted. Pt remains safe on the unit. Will continue to monitor. 

## 2020-06-05 NOTE — ED Notes (Signed)
Patient given sandwich, chips, cheese stick with soda

## 2020-06-05 NOTE — ED Notes (Signed)
AM psych Breakfast Order placed

## 2020-06-06 NOTE — ED Notes (Signed)
Pt alert and oriented on the unit. Education, support, and encouragement provided. Discharge summary, medications and follow up appointments reviewed with pt. Suicide prevention resources provided. Pt's belongings in locker returned. Pt denies SI/HI, A/VH, pain, or any concerns at this time. Pt ambulatory on and off unit. Pt discharged to lobby. 

## 2020-06-06 NOTE — ED Notes (Signed)
Pt given breakfast; cereal, muffin

## 2020-06-06 NOTE — ED Notes (Signed)
Pt asleep with even and unlabored respirations. No distress or discomfort noted. Pt remains safe on the unit. Will continue to monitor. 

## 2020-06-06 NOTE — Progress Notes (Signed)
CSW talked with patient regarding homeless shelter placement. CSW discussed there not being any beds but some availability at the emergency shelter at [facility] for the night but unsure of bed placement for the program that [employer] has.   Patient discussed wanting inpatient hospitalization. CSW discussed talking with her doctor and reinforced the bed placement at [facility].  CSW informed Berneice Heinrich, NP.    Stephannie Peters, MSW, LCSW Licensed Clinical Social Worker - PRN (Transition of Care Team) [facility]

## 2020-06-06 NOTE — Discharge Instructions (Signed)
Patient is instructed prior to discharge to:  Take all medications as prescribed by his/her mental healthcare provider. Report any adverse effects and or reactions from the medicines to his/her outpatient provider promptly. Keep all scheduled appointments, to ensure that you are getting refills on time and to avoid any interruption in your medication.  If you are unable to keep an appointment call to reschedule.  Be sure to follow-up with resources and follow-up appointments provided.  Patient has been instructed & cautioned: To not engage in alcohol and or illegal drug use while on prescription medicines. In the event of worsening symptoms, patient is instructed to call the crisis hotline, 911 and or go to the nearest ED for appropriate evaluation and treatment of symptoms. To follow-up with his/her primary care provider for your other medical issues, concerns and or health care needs.    Homeless Shelter List:     [facility] ([facility])  [address] [place], [place]  Phone: [phone]     [facility]  [address], [place], [place] [address]  Phone: [phone]     [facility] (Women only)  [address], [place], [place] [address]  Phone: [phone]     [facility]  [address] [place], [place] [address]  Phone: [phone]     [facility]:  [address]  [place], [place] [address]  Phone: [phone]     [facility]  [address], [place], [place] [address]  (Check in at 6:00PM for placement at a local shelter)  Phone: [phone]   Substance abuse resources and Residential Options:  [facility] day residential substance abuse facility (not an option if you have active assault charges). [address], [place], [place] [address] Phone: [phone]: Ask for Shayla in admissions to complete intake  if interested in pursuing this option.  [facility]: Can get intake scheduled; (not an option if you have active assault charges). [address] [place], [place] ([phone]) Call Mon-Fri.  [facility]): (offers outpatient therapy and intensive outpatient substance abuse therapy).  [address], [place], [place] [address] Phone: [phone]  [facility]: Offers FREE recovery skills classes, support groups, 1:1 Peer Support, and Compeer Classes. [address], [place], [place] [address] Phone: [phone] (Call to complete intake).   [facility] [address] [place], [place] [address] Phone: [phone] ext [phone]  [facility] provides food, shelter and other programs and services to the homeless men of [place] through our men's program.  By offering safe shelter, three meals a day, clean clothing, Biblical counseling, financial planning, vocational training, GED/education and employment assistance, we've helped mend the shattered lives of many homeless men since opening in 1974.  We have approximately 267 beds available, with a max of 312 beds including mats for emergency situations and currently house an average of 270 men a night.  Prospective Client Check-In Information Photo ID Required (State/ Out of State/ DOC) - if photo ID is not available, clients are required to have a printout of a police/sheriff's criminal history report. Help out with chores around the Mission. No sex offender of any type (pending, charged, registered and/or any other sex related offenses) will be permitted to check in. Must be willing to abide by all rules, regulations, and policies established by the  [facility]. The following will   be provided - shelter, food, clothing, and biblical counseling. If you or someone you know is in need of assistance at our men's shelter in [place], [place],  please call [phone] ext. [phone].  

## 2020-06-06 NOTE — ED Provider Notes (Signed)
[facility]/OBS ASAP Discharge Summary  Date and Time: 06/06/2020 1:01 PM  Name: Debra Sullivan  MRN:  [id number]   Discharge Diagnoses:  Final diagnoses:  Suicidal ideation    Subjective: Patient states "I am feeling better, I would like to go to a shelter please."  Patient reports readiness to discharge.  Patient reports plan to follow-up with outpatient psychiatry.  Patient reports plan to follow-up with [facility].  Patient reports she has been calling them once weekly while awaiting admission to their acting program.  Patient reports she has her home medications with her and plans to remain compliant with medications.  Patient assessed by nurse practitioner.  Patient alert and oriented, answers appropriately.  Patient denies suicidal homicidal ideations today.  Patient contracts verbally for safety with this Clinical research associate.  Patient denies auditory and visual hallucinations.  There is no evidence of delusional thought content and no indication that patient is responding to internal stimuli.  Patient denies symptoms of paranoia.  Patient reports she is currently homeless in [place] area however she does not wish to return to the [facility] as she has no way to get back to [place].  Patient reports she has friends in the [place] area that she will call once she is discharged and has access to her cell phone.  Patient denies any person to contact for collateral information at this time.   Stay Summary:  From admission H&P assessment:Patient states "I came in for suicidal thoughts."  Patient reports "I am afraid I will backslide because I am homeless and I have been sleeping outside for a week."  Patient reports suicidal ideations x1 week.  Patient endorses plan to overdose on medications.  Patient denies intent to complete suicide.  Patient reports she would like help with housing or shelter options.  Patient contracts verbally for safety with this Clinical research associate.  Patient  denies self-harm behaviors.   Patient reports current stressors include homelessness as well as concern for relapse on substance.  Patient reports until 1 week ago she resided with her brother however her brother has a 1 bedroom apartment and simply did not have the space for her to continue to be in his home.  Patient reports brother did pay for hotel room for 2 nights but he did not have money to continue to pay for hotel room.   Patient is currently homeless in [place].  Patient denies access to weapons.  Patient is currently not employed.  Patient endorses marijuana use, last use approximately 4 days ago.  Patient denies substance use aside from marijuana.  Patient denies current alcohol use, last use of alcohol 2 weeks ago.  Patient endorses average sleep and appetite.   Patient assessed by nurse practitioner.  Patient alert and oriented, answers appropriately.  Patient denies homicidal ideations.  Patient denies auditory and visual hallucinations.  There is no evidence of delusional thought content and no indication that patient is responding to internal stimuli.  Patient denies symptoms of paranoia.   Patient reports she is currently awaiting new act team placement.  Patient has been [facility] and they will begin care for this patient is up in approximately 3 weeks time.  Patient reports she follows up with act team once weekly as directed by act team.  Patient reports feeling confident that act team will assist her in finding housing.  Patient reports compliance with current medications including risperidone 0.25 mg daily and 2 mg nightly.  Total Time spent with patient: 30 minutes  Past Psychiatric History: Bipolar 1 disorder, PTSD, major depressive disorder with psychosis, cocaine use disorder, cannabis use disorder, psychosis  Past Medical History:  Past Medical History:  Diagnosis Date  . Anemia   . Drug abuse (HCC)   . Psychosis (HCC) 2013    Past  Surgical History:  Procedure Laterality Date  . CESAREAN SECTION  03/2016   [facility]  . LAPAROSCOPY N/A 04/14/2017   Procedure: LAPAROSCOPY OPERATIVE WITH RIGHT SALPINGECTOMY;  Surgeon: Conan Bowensavis, Kelly M, MD;  Location: [facility];  Service: Gynecology;  Laterality: N/A;  . TUBAL LIGATION Bilateral 01/22/2018   Procedure: POST PARTUM TUBAL LIGATION;  Surgeon: Levie HeritageStinson, Jacob J, DO;  Location: [facility];  Service: Gynecology;  Laterality: Bilateral;   Family History:  Family History  Problem Relation Age of Onset  . Diabetes Mother   . Schizophrenia Mother   . Diabetes Brother   . Hypertension Maternal Aunt   . Healthy Father    Family Psychiatric History: None reported Social History:  Social History   Substance and Sexual Activity  Alcohol Use Not Currently  . Alcohol/week: 0.0 standard drinks   Comment: socially     Social History   Substance and Sexual Activity  Drug Use Not Currently  . Types: Marijuana, Cocaine   Comment: Cocaine & Marijuana was used10/26/2018    Social History   Socioeconomic History  . Marital status: Single    Spouse name: Not on file  . Number of children: Not on file  . Years of education: Not on file  . Highest education level: Not on file  Occupational History  . Not on file  Tobacco Use  . Smoking status: Current Every Day Smoker    Packs/day: 0.25    Types: Cigarettes  . Smokeless tobacco: Never Used  Vaping Use  . Vaping Use: Never used  Substance and Sexual Activity  . Alcohol use: Not Currently    Alcohol/week: 0.0 standard drinks    Comment: socially  . Drug use: Not Currently    Types: Marijuana, Cocaine    Comment: Cocaine & Marijuana was used10/26/2018  . Sexual activity: Yes    Birth control/protection: None  Other Topics Concern  . Not on file  Social History Narrative  . Not on file   Social Determinants of Health   Financial Resource Strain: Not on file  Food Insecurity: Not on file   Transportation Needs: Not on file  Physical Activity: Not on file  Stress: Not on file  Social Connections: Not on file   SDOH:  SDOH Screenings   Alcohol Screen: Low Risk   . Last Alcohol Screening Score (AUDIT): 0  Depression (PHQ2-9): Medium Risk  . PHQ-2 Score: 19  Financial Resource Strain: Not on file  Food Insecurity: Not on file  Housing: Not on file  Physical Activity: Not on file  Social Connections: Not on file  Stress: Not on file  Tobacco Use: High Risk  . Smoking Tobacco Use: Current Every Day Smoker  . Smokeless Tobacco Use: Never Used  Transportation Needs: Not on file    Has this patient used any form of tobacco in the last 30 days? (Cigarettes, Smokeless Tobacco, Cigars, and/or Pipes) A prescription for an FDA-approved tobacco cessation medication was offered at discharge and the patient refused  Current Medications:  Current Facility-Administered Medications  Medication Dose Route Frequency Provider Last Rate Last Admin  . acetaminophen (TYLENOL) tablet  650 mg  650 mg Oral Q6H PRN Patrcia Dolly, FNP      . alum & mag hydroxide-simeth (MAALOX/MYLANTA) 200-200-20 MG/5ML suspension 30 mL  30 mL Oral Q4H PRN Patrcia Dolly, FNP      . hydrOXYzine (ATARAX/VISTARIL) tablet 25 mg  25 mg Oral TID PRN Patrcia Dolly, FNP   25 mg at 06/06/20 1115  . magnesium hydroxide (MILK OF MAGNESIA) suspension 30 mL  30 mL Oral Daily PRN Patrcia Dolly, FNP      . risperiDONE (RISPERDAL M-TABS) disintegrating tablet 2 mg  2 mg Oral QHS Patrcia Dolly, FNP   2 mg at 06/05/20 2111  . risperiDONE (RISPERDAL) tablet 0.25 mg  0.25 mg Oral Daily Patrcia Dolly, FNP   0.25 mg at 06/06/20 1113  . traZODone (DESYREL) tablet 50 mg  50 mg Oral QHS PRN Patrcia Dolly, FNP       Current Outpatient Medications  Medication Sig Dispense Refill  . hydrOXYzine (ATARAX/VISTARIL) 25 MG tablet Take 1 tablet (25 mg total) by mouth 3 (three) times daily as needed for anxiety. 30 tablet 0  . risperiDONE (RISPERDAL  M-TABS) 2 MG disintegrating tablet Take 1 tablet (2 mg total) by mouth at bedtime. 30 tablet 0  . risperiDONE (RISPERDAL) 0.25 MG tablet Take 1 tablet (0.25 mg total) by mouth daily. 30 tablet 0  . traZODone (DESYREL) 50 MG tablet Take 1 tablet (50 mg total) by mouth at bedtime as needed for sleep. 30 tablet 0    PTA Medications: (Not in a hospital admission)   Musculoskeletal  Strength & Muscle Tone: within normal limits Gait & Station: normal Patient leans: N/A  Psychiatric Specialty Exam  Presentation  General Appearance: Appropriate for Environment; Casual  Eye Contact:Good  Speech:Clear and Coherent; Normal Rate  Speech Volume:Normal  Handedness:Right   Mood and Affect  Mood:Euthymic  Affect:Appropriate; Congruent   Thought Process  Thought Processes:Coherent; Goal Directed  Descriptions of Associations:Intact  Orientation:Full (Time, Place and Person)  Thought Content:Logical  Hallucinations:Hallucinations: None  Ideas of Reference:None  Suicidal Thoughts:Suicidal Thoughts: No SI Passive Intent and/or Plan: With Plan; Without Intent  Homicidal Thoughts:Homicidal Thoughts: No   Sensorium  Memory:Immediate Good; Recent Good; Remote Good  Judgment:Fair  Insight:Fair   Executive Functions  Concentration:Good  Attention Span:Good  Recall:Good  Fund of Knowledge:Good  Language:Good   Psychomotor Activity  Psychomotor Activity:Psychomotor Activity: Normal   Assets  Assets:Communication Skills; Desire for Improvement; Financial Resources/Insurance; Intimacy; Leisure Time; Physical Health; Resilience; Social Support; Talents/Skills; Vocational/Educational   Sleep  Sleep:Sleep: Fair   Physical Exam  Physical Exam Vitals and nursing note reviewed.  Constitutional:      Appearance: She is well-developed.  HENT:     Head: Normocephalic.  Cardiovascular:     Rate and Rhythm: Normal rate.  Pulmonary:     Effort: Pulmonary effort is  normal.  Neurological:     Mental Status: She is alert and oriented to person, place, and time.  Psychiatric:        Attention and Perception: Attention and perception normal.        Mood and Affect: Mood and affect normal.        Speech: Speech normal.        Behavior: Behavior normal. Behavior is cooperative.        Thought Content: Thought content normal.        Cognition and Memory: Cognition and memory normal.        Judgment:  Judgment normal.    Review of Systems  Constitutional: Negative.   HENT: Negative.   Eyes: Negative.   Respiratory: Negative.   Cardiovascular: Negative.   Gastrointestinal: Negative.   Genitourinary: Negative.   Musculoskeletal: Negative.   Skin: Negative.   Neurological: Negative.   Endo/Heme/Allergies: Negative.   Psychiatric/Behavioral: Positive for substance abuse.   Blood pressure 110/73, pulse 75, temperature 97.7 F (36.5 C), temperature source Oral, resp. rate 18, height 5\' 4"  (1.626 m), weight 145 lb (65.8 kg), SpO2 100 %. Body mass index is 24.89 kg/m.  Demographic Factors:  Unemployed  Loss Factors: NA  Historical Factors: NA  Risk Reduction Factors:   Positive social support, Positive therapeutic relationship and Positive coping skills or problem solving skills  Continued Clinical Symptoms:  Alcohol/Substance Abuse/Dependencies  Cognitive Features That Contribute To Risk:  None    Suicide Risk:  Minimal: No identifiable suicidal ideation.  Patients presenting with no risk factors but with morbid ruminations; may be classified as minimal risk based on the severity of the depressive symptoms  Plan Of Care/Follow-up recommendations:  Other:  Patient reviewed with Dr. .  Follow-up with outpatient psychiatry, resources provided. Follow-up with substance use treatment resources provided.  Disposition: Discharge  Nelly Rout, FNP 06/06/2020, 1:01 PM

## 2020-06-06 NOTE — Progress Notes (Signed)
CSW contacted the following shelters to find housing placement for the patient:  [facility] - [employer] (left a Engineer, technical sales)  Psychologist, clinical - [employer] (only men shelter) [facility] - [facility] (no one answered) [facility] (only men shelter)  [facility] - [place] (no bed availability at this time) Holiday representative - [employer]  (no one answered) Holiday representative - [place] (waitlist and intake process which is Monday through Friday) [facility] - [employer] [facility] [employer] (no answer) [facility] for [employer] (no answer) [facility] [employer] - [place] (office is closed)     Stephannie Peters, MSW, LCSW Licensed Visual merchandiser - PRN (Transition of Care Team) [facility]  [employer] [facility]

## 2020-06-08 ENCOUNTER — Other Ambulatory Visit: Payer: Self-pay

## 2020-06-08 ENCOUNTER — Emergency Department (HOSPITAL_COMMUNITY)
Admission: EM | Admit: 2020-06-08 | Discharge: 2020-06-08 | Disposition: A | Discharge status: Home / Self Care | Payer: Medicaid Other | Attending: Emergency Medicine | Admitting: Emergency Medicine

## 2020-06-08 ENCOUNTER — Encounter (HOSPITAL_COMMUNITY): Payer: Self-pay | Admitting: Emergency Medicine

## 2020-06-08 DIAGNOSIS — M545 Low back pain, unspecified: Secondary | ICD-10-CM | POA: Insufficient documentation

## 2020-06-08 DIAGNOSIS — F17210 Nicotine dependence, cigarettes, uncomplicated: Secondary | ICD-10-CM | POA: Insufficient documentation

## 2020-06-08 DIAGNOSIS — M549 Dorsalgia, unspecified: Secondary | ICD-10-CM

## 2020-06-08 LAB — URINALYSIS, ROUTINE W REFLEX MICROSCOPIC
Bilirubin Urine: NEGATIVE
Glucose, UA: NEGATIVE mg/dL
Ketones, ur: NEGATIVE mg/dL
Leukocytes,Ua: NEGATIVE
Nitrite: NEGATIVE
Protein, ur: NEGATIVE mg/dL
Specific Gravity, Urine: 1.028 (ref 1.005–1.030)
pH: 5 (ref 5.0–8.0)

## 2020-06-08 LAB — CBC WITH DIFFERENTIAL/PLATELET
Abs Immature Granulocytes: 0.01 10*3/uL (ref 0.00–0.07)
Basophils Absolute: 0 10*3/uL (ref 0.0–0.1)
Basophils Relative: 1 %
Eosinophils Absolute: 0.1 10*3/uL (ref 0.0–0.5)
Eosinophils Relative: 2 %
HCT: 34 % — ABNORMAL LOW (ref 36.0–46.0)
Hemoglobin: 10.9 g/dL — ABNORMAL LOW (ref 12.0–15.0)
Immature Granulocytes: 0 %
Lymphocytes Relative: 37 %
Lymphs Abs: 2.8 10*3/uL (ref 0.7–4.0)
MCH: 24.4 pg — ABNORMAL LOW (ref 26.0–34.0)
MCHC: 32.1 g/dL (ref 30.0–36.0)
MCV: 76.2 fL — ABNORMAL LOW (ref 80.0–100.0)
Monocytes Absolute: 0.6 10*3/uL (ref 0.1–1.0)
Monocytes Relative: 8 %
Neutro Abs: 4 10*3/uL (ref 1.7–7.7)
Neutrophils Relative %: 52 %
Platelets: 361 10*3/uL (ref 150–400)
RBC: 4.46 MIL/uL (ref 3.87–5.11)
RDW: 15.4 % (ref 11.5–15.5)
WBC: 7.6 10*3/uL (ref 4.0–10.5)
nRBC: 0 % (ref 0.0–0.2)

## 2020-06-08 LAB — BASIC METABOLIC PANEL
Anion gap: 10 (ref 5–15)
BUN: 13 mg/dL (ref 6–20)
CO2: 22 mmol/L (ref 22–32)
Calcium: 8.8 mg/dL — ABNORMAL LOW (ref 8.9–10.3)
Chloride: 106 mmol/L (ref 98–111)
Creatinine, Ser: 0.71 mg/dL (ref 0.44–1.00)
GFR, Estimated: >60 mL/min (ref >60)
Glucose, Bld: 84 mg/dL (ref 70–99)
Potassium: 3.7 mmol/L (ref 3.5–5.1)
Sodium: 138 mmol/L (ref 135–145)

## 2020-06-08 LAB — I-STAT BETA HCG BLOOD, ED (MC, WL, AP ONLY): I-stat hCG, quantitative: <5 m[IU]/mL (ref <5)

## 2020-06-08 MED ORDER — ACETAMINOPHEN 325 MG PO TABS
650.0000 mg | ORAL_TABLET | Freq: Once | ORAL | Status: AC
  Administered 2020-06-08: 09:00:00 650 mg via ORAL
  Filled 2020-06-08: qty 2

## 2020-06-08 MED ORDER — CYCLOBENZAPRINE HCL 10 MG PO TABS
5.0000 mg | ORAL_TABLET | Freq: Once | ORAL | Status: AC
  Administered 2020-06-08: 09:00:00 5 mg via ORAL
  Filled 2020-06-08: qty 1

## 2020-06-08 MED ORDER — CYCLOBENZAPRINE HCL 10 MG PO TABS: 10.0000 mg | ORAL_TABLET | Freq: Two times a day (BID) | ORAL | 0 refills | Status: AC | PRN

## 2020-06-08 MED ORDER — LIDOCAINE 5 % EX PTCH
1.0000 | MEDICATED_PATCH | Freq: Once | CUTANEOUS | Status: DC
  Administered 2020-06-08: 09:00:00 1 via TRANSDERMAL
  Filled 2020-06-08: qty 1

## 2020-06-08 NOTE — ED Provider Notes (Signed)
[facility] EMERGENCY DEPARTMENT Provider Note   CSN: [id number] Arrival date & time: 06/08/20  0007     History Chief Complaint  Patient presents with  . Back Pain    Debra Sullivan is a 34 y.o. female with past medical history significant for anemia, drug abuse and psychosis.  HPI Presents to emergency room today with chief complaint of low back pain x1 day after falling down 5 stairs the day before. She is endorsing aching pain her entire back. She states the pain is intermittent. The pain is worse with movement. She rates the pain 7/10 in severity. She tried taking aspirin without symptom improvement. She denies hitting her head or loss of consciousness during the fall. She was able to get up and ambulate without assistance and has been ambulatory since the fall. Denies fevers, weight loss, numbness/weakness of upper and lower extremities, bowel/bladder incontinence, urinary retention, history of cancer, saddle anesthesia, history of back surgery, history of IVDA. Her history of drug abuse includes smoking marijuana and cocaine, nothing IV  Past Medical History:  Diagnosis Date  . Anemia   . Drug abuse (HCC)   . Psychosis (HCC) 2013    Patient Active Problem List   Diagnosis Date Noted  . Bipolar 1 disorder (HCC) 03/27/2020  . Bipolar I disorder, most recent episode (or current) manic (HCC) 06/14/2018  . VBAC, delivered, current hospitalization 01/22/2018  . Supervision of high-risk pregnancy 01/21/2018  . LGA (large for gestational age) fetus affecting management of mother, third trimester, fetus 1 01/08/2018  . Anemia during pregnancy in third trimester 11/28/2017  . Drug dependence affecting pregnancy in third trimester   . Suspected fetal anomaly, antepartum   . Trichomonosis 08/01/2017  . Not immune to rubella 08/01/2017  . Supervision of other normal pregnancy, antepartum 07/11/2017  . History of cesarean section complicating pregnancy 07/11/2017   . Unwanted fertility 07/11/2017  . 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 Cannabis use disorder, severe, dependence (HCC) 04/15/2015  . PTSD (post-traumatic stress disorder) 04/15/2015  . Cocaine use disorder, moderate, in early remission (HCC) 04/15/2015  . Psychosis (HCC) 04/14/2015    Past Surgical History:  Procedure Laterality Date  . CESAREAN SECTION  03/2016   pLTCS for twin B at [facility]  . LAPAROSCOPY N/A 04/14/2017   Procedure: LAPAROSCOPY OPERATIVE WITH RIGHT SALPINGECTOMY;  Surgeon: Conan Bowens, MD;  Location: [facility];  Service: Gynecology;  Laterality: N/A;  . TUBAL LIGATION Bilateral 01/22/2018   Procedure: POST PARTUM TUBAL LIGATION;  Surgeon: [name], DO;  [facility];  Service: Gynecology;  Laterality: Bilateral;     OB History    Gravida  6   Para  5   Term  4   Preterm  1   AB  1   Living  6     SAB  0   IAB      Ectopic  1   Multiple  1   Live Births  6           Family History  Problem Relation Age of Onset  . Diabetes Mother   . Schizophrenia Mother   . Diabetes Brother   . Hypertension Maternal Aunt   . Healthy Father     Social History   Tobacco Use  . Smoking status: Current Every Day Smoker    Packs/day: 0.25    Types: Cigarettes  . Smokeless tobacco: Never Used  Vaping Use  . Vaping Use: Never used  Substance Use Topics  . Alcohol use: Not Currently    Alcohol/week: 0.0 standard drinks    Comment: socially  . Drug use: Not Currently    Types: Marijuana, Cocaine    Comment: Cocaine & Marijuana was used10/26/2018    Home Medications Prior to Admission medications   Medication Sig Start Date End Date Taking? Authorizing Provider  cyclobenzaprine (FLEXERIL) 10 MG tablet Take 1 tablet (10 mg total) by mouth 2 (two) times daily as needed for muscle spasms. 06/08/20   Namon CirriWalisiewicz, Coleston Dirosa E, PA-C  hydrOXYzine (ATARAX/VISTARIL) 25 MG  tablet Take 1 tablet (25 mg total) by mouth 3 (three) times daily as needed for anxiety. 05/25/20   Laveda AbbeParks, Laurie Britton, NP  risperiDONE (RISPERDAL M-TABS) 2 MG disintegrating tablet Take 1 tablet (2 mg total) by mouth at bedtime. 05/25/20   Laveda AbbeParks, Laurie Britton, NP  risperiDONE (RISPERDAL) 0.25 MG tablet Take 1 tablet (0.25 mg total) by mouth daily. 05/26/20   Laveda AbbeParks, Laurie Britton, NP  traZODone (DESYREL) 50 MG tablet Take 1 tablet (50 mg total) by mouth at bedtime as needed for sleep. 05/25/20   Laveda AbbeParks, Laurie Britton, NP    Allergies    Naproxen  Review of Systems   Review of Systems All other systems are reviewed and are negative for acute change except as noted in the HPI.  Physical Exam Updated Vital Signs BP (!) 111/56   Pulse 73   Temp 98.1 F (36.7 C) (Oral)   Resp 14   LMP 06/02/2020   SpO2 100%   Physical Exam Vitals and nursing note reviewed.  Constitutional:      General: She is not in acute distress.    Appearance: She is not ill-appearing.  HENT:     Head: Normocephalic and atraumatic.     Comments: No tenderness to palpation of skull. No deformities or crepitus noted. No open wounds, abrasions or lacerations.    Right Ear: Tympanic membrane and external ear normal.     Left Ear: Tympanic membrane and external ear normal.     Nose: Nose normal.     Mouth/Throat:     Mouth: Mucous membranes are moist.     Pharynx: Oropharynx is clear.  Eyes:     General: No scleral icterus.       Right eye: No discharge.        Left eye: No discharge.     Extraocular Movements: Extraocular movements intact.     Conjunctiva/sclera: Conjunctivae normal.     Pupils: Pupils are equal, round, and reactive to light.  Neck:     Vascular: No JVD.  Cardiovascular:     Rate and Rhythm: Normal rate and regular rhythm.     Pulses: Normal pulses.          Radial pulses are 2+ on the right side and 2+ on the left side.       Dorsalis pedis pulses are 2+ on the right side and 2+ on the  left side.     Heart sounds: Normal heart sounds.  Pulmonary:     Comments: Lungs clear to auscultation in all fields. Symmetric chest rise. No wheezing, rales, or rhonchi. Abdominal:     Comments: Abdomen is soft, non-distended, and non-tender in all quadrants. No rigidity, no guarding. No peritoneal signs.  Musculoskeletal:        General: Normal range of motion.     Cervical back: Normal range of motion.  Back:     Comments: Pain as depicted in image above. No overlying skin changes. No step off, deformity, or crepitus. No midline tenderness. Full ROM of cervical, thoracic and lumbar spine.  Skin:    General: Skin is warm and dry.     Capillary Refill: Capillary refill takes less than 2 seconds.     Comments: No track marks seen on extremities  Neurological:     Mental Status: She is oriented to person, place, and time.     GCS: GCS eye subscore is 4. GCS verbal subscore is 5. GCS motor subscore is 6.     Comments: Sensation grossly intact to light touch in the lower extremities bilaterally. No saddle anesthesias. Strength 5/5 with flexion and extension at the bilateral hips, knees, and ankles. No noted gait deficit. Coordination intact with heel to shin testing.  Psychiatric:        Behavior: Behavior normal.     ED Results / Procedures / Treatments   Labs (all labs ordered are listed, but only abnormal results are displayed) Labs Reviewed  CBC WITH DIFFERENTIAL/PLATELET - Abnormal; Notable for the following components:      Result Value   Hemoglobin 10.9 (*)    HCT 34.0 (*)    MCV 76.2 (*)    MCH 24.4 (*)    All other components within normal limits  BASIC METABOLIC PANEL - Abnormal; Notable for the following components:   Calcium 8.8 (*)    All other components within normal limits  URINALYSIS, ROUTINE W REFLEX MICROSCOPIC - Abnormal; Notable for the following components:   Hgb urine dipstick SMALL (*)    Bacteria, UA RARE (*)    All other components within  normal limits  I-STAT BETA HCG BLOOD, ED ([facility], [facility], [facility] ONLY)    EKG None  Radiology No results found.  Procedures Procedures (including critical care time)  Medications Ordered in ED Medications  acetaminophen (TYLENOL) tablet 650 mg (has no administration in time range)  lidocaine (LIDODERM) 5 % 1 patch (has no administration in time range)  cyclobenzaprine (FLEXERIL) tablet 5 mg (has no administration in time range)    ED Course  I have reviewed the triage vital signs and the nursing notes.  Pertinent labs & imaging results that were available during my care of the patient were reviewed by me and considered in my medical decision making (see chart for details).    MDM Rules/Calculators/A&P                          History provided by patient with additional history obtained from chart review.    Patient with back pain after mechanical fall.  No signs of serious head, neck or back injury on exam. No neurological deficits and normal neuro exam.  Ambulatory with normal gait.  No loss of bowel or bladder control.  No concern for cauda equina.  No fever, night sweats, weight loss, h/o cancer, IVDU.   Labs were collected in triage. Results notable for hemoglobin of 10.9, consistent with her baseline. BMP unremarkable. Pregnancy test negative. UA without signs of infection. No indications for emergent imaging of her back pain given reassuring exam. Given tylenol and lidocaine patch in the ED. Has allergy to Naproxen with reaction of anaphylaxis, so will hold off on NSAIDs. Discharged to home with short course of flexeril prn. Advised not to drive or work while taking as it can make her drowsy. She is agreeable  with plan of care. Strict return precautions discussed.   Portions of this note were generated with Scientist, clinical (histocompatibility and immunogenetics). Dictation errors may occur despite best attempts at proofreading.   Final Clinical Impression(s) / ED Diagnoses Final diagnoses:  Acute bilateral back  pain, unspecified back location    Rx / DC Orders ED Discharge Orders         Ordered    cyclobenzaprine (FLEXERIL) 10 MG tablet  2 times daily PRN        06/08/20 0853           Shanon Ace, PA-C 06/08/20 6433    Linwood Dibbles, MD 06/09/20 [phone]

## 2020-06-08 NOTE — ED Triage Notes (Signed)
Patient reports low back pain onset yesterday , patient stated that she fell 2 days ago , denies hematuria or fever .

## 2020-06-08 NOTE — Discharge Instructions (Addendum)
Your back pain should be treated with tylenol and this back pain should get better over the next 2 weeks.   Follow Up: Please follow up with your primary healthcare provider in 1-2 weeks for reassessment. if you do not have a primary care doctor use the resource guide provided to find one.  Low back pain is discomfort in the lower back that may be due to injuries to muscles and ligaments around the spine. Occasionally, it may be caused by a a problem to a part of the spine called a disc. The pain may last several days or a week;  However, most patients get completely well in 4 weeks.   1. Medications: tylenol and muscle relaxer flexeril.  Muscle relaxants:  These medications can help with muscle tightness that is a cause of lower back pain. Most of these medications can cause drowsiness, and it is not safe to drive or use dangerous machinery while taking them.You can take Flexeril as needed for muscle spasm up to twice daily but do not drive, drink alcohol, or operate heavy machinery while taking this medicine because it may make you drowsy.  I typically recommend taking this medicine only at night when you are going to sleep.  You can also cut these tablets in half if they make you feel very drowsy.  2. Treatment: rest, drink plenty of fluids, gentle stretching as discussed (see attached), alternate ice and heat (or stick with whichever feels best) 20 minutes on 20 minutes off. Maintaining your daily activities, including walking, is encourged, as it will help you get better faster than just staying in bed.   Be aware that if you develop new symptoms, such as a fever, leg weakness, difficulty with or loss of control of your urine or bowels, abdominal pain, or more severe pain, you will need to seek medical attention immediately and  / or return to the Emergency department.

## 2020-06-08 NOTE — ED Notes (Signed)
Patient verbalizes understanding of discharge instructions. Opportunity for questioning and answers were provided. Pt discharged from ED. 

## 2020-06-09 ENCOUNTER — Telehealth (HOSPITAL_COMMUNITY): Payer: Self-pay | Admitting: General Practice

## 2020-06-09 NOTE — Telephone Encounter (Signed)
Care Management - Follow Up [facility] Discharges   Writer attempted to make contact with patient today and was unsuccessful.  The number listed in the epic chart does not belong to the patient.

## 2020-10-19 ENCOUNTER — Ambulatory Visit
Admission: EM | Admit: 2020-10-19 | Discharge: 2020-10-19 | Discharge status: Against Medical Advice | Payer: Medicaid Other

## 2020-10-19 ENCOUNTER — Other Ambulatory Visit: Payer: Self-pay

## 2020-10-19 NOTE — ED Notes (Signed)
No answer in waiting room x2.

## 2020-11-04 ENCOUNTER — Ambulatory Visit (HOSPITAL_COMMUNITY)
Admission: EM | Admit: 2020-11-04 | Discharge: 2020-11-04 | Disposition: A | Discharge status: Home / Self Care | Payer: Medicaid Other | Attending: Internal Medicine | Admitting: Internal Medicine

## 2020-11-04 ENCOUNTER — Encounter (HOSPITAL_COMMUNITY): Payer: Self-pay

## 2020-11-04 ENCOUNTER — Other Ambulatory Visit: Payer: Self-pay

## 2020-11-04 DIAGNOSIS — N898 Other specified noninflammatory disorders of vagina: Secondary | ICD-10-CM | POA: Diagnosis not present

## 2020-11-04 LAB — HIV ANTIBODY (ROUTINE TESTING W REFLEX): HIV Screen 4th Generation wRfx: NONREACTIVE

## 2020-11-04 MED ORDER — FLUCONAZOLE 150 MG PO TABS: 150.0000 mg | ORAL_TABLET | Freq: Once | ORAL | 0 refills | Status: AC

## 2020-11-04 NOTE — Discharge Instructions (Signed)
We will call you if your STD test are positive  

## 2020-11-04 NOTE — ED Triage Notes (Signed)
Pt in with c/o vaginal discharge and itching x 2 days  Denies abdominal or back pain

## 2020-11-04 NOTE — ED Provider Notes (Signed)
[facility]    CSN: [id number] Arrival date & time: 11/04/20  6599      History   Chief Complaint Chief Complaint  Patient presents with  . Vaginal Discharge  . Vaginal Itching    HPI Debra Sullivan is a 35 y.o. female who presents with vaginal white chunkie discharge x 2 days. Denies dysuria or suprapubic pain. Would like STD tests done.  LMP 4/28 Had tubal ligation     Past Medical History:  Diagnosis Date  . Anemia   . Drug abuse (HCC)   . Psychosis (HCC) 2013    Patient Active Problem List   Diagnosis Date Noted  . Bipolar 1 disorder (HCC) 03/27/2020  . Bipolar I disorder, most recent episode (or current) manic (HCC) 06/14/2018  . VBAC, delivered, current hospitalization 01/22/2018  . Supervision of high-risk pregnancy 01/21/2018  . LGA (large for gestational age) fetus affecting management of mother, third trimester, fetus 1 01/08/2018  . Anemia during pregnancy in third trimester 11/28/2017  . Drug dependence affecting pregnancy in third trimester   . Suspected fetal anomaly, antepartum   . Trichomonosis 08/01/2017  . Not immune to rubella 08/01/2017  . Supervision of other normal pregnancy, antepartum 07/11/2017  . History of cesarean section complicating pregnancy 07/11/2017  . Unwanted fertility 07/11/2017  . 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 Cannabis use disorder, severe, dependence (HCC) 04/15/2015  . PTSD (post-traumatic stress disorder) 04/15/2015  . Cocaine use disorder, moderate, in early remission (HCC) 04/15/2015  . Psychosis (HCC) 04/14/2015    Past Surgical History:  Procedure Laterality Date  . CESAREAN SECTION  03/2016   [facility]LTCS for twin B at [facility]  . LAPAROSCOPY N/A 04/14/2017   Procedure: LAPAROSCOPY OPERATIVE WITH RIGHT SALPINGECTOMY;  Surgeon: Conan Bowens, MD;  Location: [facility];  Service: Gynecology;  Laterality: N/A;  . TUBAL LIGATION Bilateral 01/22/2018    Procedure: POST PARTUM TUBAL LIGATION;  Surgeon: [name], DO;  Location: [facility];  Service: Gynecology;  Laterality: Bilateral;    OB History    Gravida  6   Para  5   Term  4   Preterm  1   AB  1   Living  6     SAB  0   IAB      Ectopic  1   Multiple  1   Live Births  6            Home Medications    Prior to Admission medications   Medication Sig Start Date End Date Taking? Authorizing Provider  fluconazole (DIFLUCAN) 150 MG tablet Take 1 tablet (150 mg total) by mouth once for 1 dose. 11/04/20 11/04/20 Yes Rodriguez-Southworth, Nettie Elm, PA-C  cyclobenzaprine (FLEXERIL) 10 MG tablet Take 1 tablet (10 mg total) by mouth 2 (two) times daily as needed for muscle spasms. 06/08/20   Namon Cirri E, PA-C  hydrOXYzine (ATARAX/VISTARIL) 25 MG tablet Take 1 tablet (25 mg total) by mouth 3 (three) times daily as needed for anxiety. 05/25/20   Laveda Abbe, NP  risperiDONE (RISPERDAL M-TABS) 2 MG disintegrating tablet Take 1 tablet (2 mg total) by mouth at bedtime. 05/25/20   Laveda Abbe, NP  risperiDONE (RISPERDAL) 0.25 MG tablet Take 1 tablet (0.25 mg total) by mouth daily. 05/26/20   Laveda Abbe, NP  traZODone (DESYREL) 50 MG tablet Take 1 tablet (50 mg total) by mouth at bedtime as needed for  sleep. 05/25/20   Laveda Abbe, NP    Family History Family History  Problem Relation Age of Onset  . Diabetes Mother   . Schizophrenia Mother   . Diabetes Brother   . Hypertension Maternal Aunt   . Healthy Father     Social History Social History   Tobacco Use  . Smoking status: Current Every Day Smoker    Packs/day: 0.25    Types: Cigarettes  . Smokeless tobacco: Never Used  Vaping Use  . Vaping Use: Never used  Substance Use Topics  . Alcohol use: Not Currently    Alcohol/week: 0.0 standard drinks    Comment: socially  . Drug use: Not Currently    Types: Marijuana, Cocaine    Comment: Cocaine &  Marijuana was used10/26/2018     Allergies   Naproxen   Review of Systems Review of Systems  Constitutional: Negative for fever.  Gastrointestinal: Negative for abdominal pain.  Genitourinary: Positive for vaginal discharge. Negative for dysuria, frequency, genital sores and pelvic pain.  Musculoskeletal: Negative for gait problem.  Skin: Negative for rash.     Physical Exam Triage Vital Signs ED Triage Vitals  Enc Vitals Group     BP 11/04/20 0848 108/65     Pulse Rate 11/04/20 0848 84     Resp 11/04/20 0848 18     Temp 11/04/20 0848 98.8 F (37.1 C)     Temp src --      SpO2 11/04/20 0848 100 %     Weight --      Height --      Head Circumference --      Peak Flow --      Pain Score 11/04/20 0847 0     Pain Loc --      Pain Edu? --      Excl. in GC? --    No data found.  Updated Vital Signs BP 108/65   Pulse 84   Temp 98.8 F (37.1 C)   Resp 18   LMP 10/14/2020 (Exact Date)   SpO2 100%   Visual Acuity Right Eye Distance:   Left Eye Distance:   Bilateral Distance:    Right Eye Near:   Left Eye Near:    Bilateral Near:     Physical Exam Vitals and nursing note reviewed.  Constitutional:      General: She is not in acute distress.    Appearance: She is normal weight. She is not toxic-appearing.  HENT:     Right Ear: External ear normal.     Left Ear: External ear normal.  Eyes:     General: No scleral icterus.    Conjunctiva/sclera: Conjunctivae normal.  Pulmonary:     Effort: Pulmonary effort is normal.  Musculoskeletal:        General: Normal range of motion.     Cervical back: Neck supple.  Neurological:     Mental Status: She is alert and oriented to person, place, and time.  Psychiatric:        Mood and Affect: Mood normal.        Behavior: Behavior normal.        Thought Content: Thought content normal.        Judgment: Judgment normal.      UC Treatments / Results  Labs (all labs ordered are listed, but only abnormal results  are displayed) Labs Reviewed  HIV ANTIBODY (ROUTINE TESTING W REFLEX)  CERVICOVAGINAL ANCILLARY ONLY    EKG  Radiology No results found.  Procedures Procedures (including critical care time)  Medications Ordered in UC Medications - No data to display  Initial Impression / Assessment and Plan / UC Course  I have reviewed the triage vital signs and the nursing notes. She may have candida vaginitis.  STD test are pending. I sent Diflucan as noted in the meant time until we get her results.   Final Clinical Impressions(s) / UC Diagnoses   Final diagnoses:  Vaginal itching     Discharge Instructions     We will call you if your STD test are positive.     ED Prescriptions    Medication Sig Dispense Auth. Provider   fluconazole (DIFLUCAN) 150 MG tablet Take 1 tablet (150 mg total) by mouth once for 1 dose. 1 tablet Rodriguez-Southworth, Nettie Elm, PA-C     PDMP not reviewed this encounter.   Garey Ham, [place] 11/04/20 [phone]

## 2020-11-05 ENCOUNTER — Telehealth (HOSPITAL_COMMUNITY): Payer: Self-pay | Admitting: Emergency Medicine

## 2020-11-05 LAB — CERVICOVAGINAL ANCILLARY ONLY
Bacterial Vaginitis (gardnerella): POSITIVE — AB
Candida Glabrata: NEGATIVE
Candida Vaginitis: POSITIVE — AB
Chlamydia: NEGATIVE
Comment: NEGATIVE
Comment: NEGATIVE
Comment: NEGATIVE
Comment: NEGATIVE
Comment: NEGATIVE
Comment: NORMAL
Neisseria Gonorrhea: NEGATIVE
Trichomonas: POSITIVE — AB

## 2020-11-05 MED ORDER — METRONIDAZOLE 500 MG PO TABS: 500.0000 mg | ORAL_TABLET | Freq: Two times a day (BID) | ORAL | 0 refills | Status: AC

## 2020-11-19 ENCOUNTER — Encounter (HOSPITAL_COMMUNITY): Payer: Self-pay | Admitting: Pharmacy Technician

## 2020-11-19 ENCOUNTER — Emergency Department (HOSPITAL_COMMUNITY)
Admission: EM | Admit: 2020-11-19 | Discharge: 2020-11-19 | Discharge status: Against Medical Advice | Payer: Medicaid Other | Attending: Physician Assistant | Admitting: Physician Assistant

## 2020-11-19 DIAGNOSIS — N898 Other specified noninflammatory disorders of vagina: Secondary | ICD-10-CM | POA: Diagnosis not present

## 2020-11-19 DIAGNOSIS — Z5321 Procedure and treatment not carried out due to patient leaving prior to being seen by health care provider: Secondary | ICD-10-CM | POA: Insufficient documentation

## 2020-11-19 LAB — URINALYSIS, ROUTINE W REFLEX MICROSCOPIC
Bilirubin Urine: NEGATIVE
Glucose, UA: NEGATIVE mg/dL
Hgb urine dipstick: NEGATIVE
Ketones, ur: 20 mg/dL — AB
Leukocytes,Ua: NEGATIVE
Nitrite: NEGATIVE
Protein, ur: NEGATIVE mg/dL
Specific Gravity, Urine: 1.016 (ref 1.005–1.030)
pH: 5 (ref 5.0–8.0)

## 2020-11-19 LAB — PREGNANCY, URINE: Preg Test, Ur: NEGATIVE

## 2020-11-19 NOTE — ED Notes (Signed)
Pt called multiple times to room, no answer.

## 2020-11-19 NOTE — ED Provider Notes (Signed)
Emergency Medicine Provider Triage Evaluation Note  Debra Sullivan , a 35 y.o. female  was evaluated in triage.  Pt complains of abdominal pain, vomiting and discharge for the past few days.  Review of Systems  Positive: Abdominal pain, vomiting Negative: Fever  Physical Exam  BP 105/81 (BP Location: Left Arm)   Pulse (!) 108   Temp 98.5 F (36.9 C) (Oral)   Resp 16   SpO2 100%  Gen:   Awake, no Resp:  Normal effort MSK:   Moves extremities without difficulty Other:  No respiratory distress  Medical Decision Making  Medically screening exam initiated at 3:31 PM.  Appropriate orders placed.  LORMA HEATER was informed that the remainder of the evaluation will be completed by another provider, this initial triage assessment does not replace that evaluation, and the importance of remaining in the ED until their evaluation is complete.  Urine pregnancy test   Dietrich Pates, PA-C 11/19/20 1531    Margarita Grizzle, MD 11/22/20 1430

## 2020-11-19 NOTE — ED Triage Notes (Signed)
Pt here with reports of vaginal discharge and pelvic pain onset 1 week ago. Pt states she thinks she might be pregnant. LMP 11/09/2020.

## 2020-11-27 ENCOUNTER — Emergency Department (HOSPITAL_COMMUNITY)
Admission: EM | Admit: 2020-11-27 | Discharge: 2020-12-17 | Disposition: E | Payer: Medicaid Other | Attending: Emergency Medicine | Admitting: Emergency Medicine

## 2020-11-27 DIAGNOSIS — I469 Cardiac arrest, cause unspecified: Secondary | ICD-10-CM | POA: Diagnosis not present

## 2020-11-27 DIAGNOSIS — S1083XA Contusion of other specified part of neck, initial encounter: Secondary | ICD-10-CM | POA: Diagnosis not present

## 2020-11-27 DIAGNOSIS — X58XXXA Exposure to other specified factors, initial encounter: Secondary | ICD-10-CM | POA: Insufficient documentation

## 2020-11-27 DIAGNOSIS — S199XXA Unspecified injury of neck, initial encounter: Secondary | ICD-10-CM | POA: Diagnosis present

## 2020-12-17 NOTE — ED Notes (Signed)
[place] contacted [phone] spoke with April Shore

## 2020-12-17 NOTE — ED Notes (Signed)
TIme of Death 0821

## 2020-12-17 NOTE — ED Provider Notes (Signed)
[facility] EMERGENCY DEPARTMENT Provider Note   CSN: [id number] Arrival date & time: 12-03-20  4034     History No chief complaint on file.   Debra Sullivan is a 35 y.o. female.  HPI Level 5 caveat Unknown aged female presents today via private via vehicle with cardiac arrest.  [facility] was in the ambulance bay when a man pulled in in a minivan.  He said that the person in the car was unresponsive.  They assisted in getting the patient out of the car and onto a stretcher.  They state that she was strapped into the front seat but slumped over.  She was brought into the ED where she was found to be pulseless, apneic, pupils are midline and unresponsive, and patient is cool to touch.  CPR was initiated.    No past medical history on file.  There are no problems to display for this patient.   The histories are not reviewed yet. Please review them in the "History" navigator section and refresh this [facility].   OB History   No obstetric history on file.     No family history on file.     Home Medications Prior to Admission medications   Not on File    Allergies    Patient has no allergy information on record.  Review of Systems   Review of Systems  Unable to perform ROS: Acuity of condition   Physical Exam Updated Vital Signs There were no vitals taken for this visit.  Physical Exam Vitals and nursing note reviewed.  Constitutional:      Comments: Unresponsive female Cool to touch  HENT:     Head: Normocephalic and atraumatic.     Right Ear: External ear normal.     Left Ear: External ear normal.     Nose: Nose normal.  Eyes:     Comments: Pupils are midsize and nonreactive  Neck:     Comments: Contusion left anterior neck Cardiovascular:     Comments: Asystolic no heart sounds noted Pulmonary:     Comments: Apneic Abdominal:     General: Abdomen is flat.     Palpations: Abdomen is soft.  Musculoskeletal:     Comments: All  extremities are flaccid No signs of trauma noted  Skin:    Comments: Skin is cool No rash, no lesions noted  Neurological:     Comments: Unresponsive    ED Results / Procedures / Treatments   Labs (all labs ordered are listed, but only abnormal results are displayed) Labs Reviewed - No data to display  EKG None  Radiology No results found.  Procedures Medications Ordered in ED Medications - No data to display  ED Course  I have reviewed the triage vital signs and the nursing notes.  Pertinent labs & imaging results that were available during my care of the patient were reviewed by me and considered in my medical decision making (see chart for details).    MDM Rules/Calculators/A&P                          Patient cool, apneic, asystolic, core temp measured 85 degrees CPR discontinued at 822 PD to be notified Discussed with Sandi Mealy, Gerarda Gunther and she will be in to evaluate Final Clinical Impression(s) / ED Diagnoses Final diagnoses:  Cardiac arrest [facility])    Rx / DC Orders ED Discharge Orders     None  Margarita Grizzle, MD 12/23/2020 [phone]

## 2020-12-17 NOTE — ED Notes (Addendum)
Patient was brought to the ED by POV , person [place] [facility] , [facility] was unloading a patient and the person asked for help , [facility] assisted in getting the patient out of the car and patient was found to be apneic and pulseless, CPR was started. Upon arrival to trauma C  Patient was very cold to touch , pupils are fixed and dilated. , apneic and pulseless, Dr. Rosalia Hammers at bedside. Cpr continued, rectal temp. 89.9, Gentlement that dropped her off said he was going to park the car however left.small red mark to the left anterior lateral neck.  [facility] notified and ME . 0850 [facility] and [place] at bedside. Patient had on a whited colored shirt and gray shirt clothing was cut. Blue colored contacts taken from both eyes. Placed in container with saline

## 2020-12-17 DEATH — deceased
